# Patient Record
Sex: Female | Born: 1950
Health system: Southern US, Community
[De-identification: ages and names within clinical notes are randomized; demographics above are authoritative.]

## PROBLEM LIST (undated history)

## (undated) DIAGNOSIS — N189 Chronic kidney disease, unspecified: Secondary | ICD-10-CM

## (undated) DIAGNOSIS — C189 Malignant neoplasm of colon, unspecified: Secondary | ICD-10-CM

## (undated) HISTORY — DX: Malignant neoplasm of colon, unspecified: C18.9

## (undated) HISTORY — PX: RHINOPLASTY: SUR1284

## (undated) HISTORY — PX: APPENDECTOMY: SHX54

## (undated) HISTORY — DX: Chronic kidney disease, unspecified: N18.9

## (undated) HISTORY — PX: TUBAL LIGATION: SHX77

## (undated) HISTORY — PX: ABDOMINAL SURGERY: SHX537

## (undated) HISTORY — PX: HERNIA REPAIR: SHX51

## (undated) HISTORY — PX: CHOLECYSTECTOMY: SHX55

---

## 1995-12-14 HISTORY — PX: BREAST SURGERY: SHX581

## 2001-01-17 ENCOUNTER — Inpatient Hospital Stay (HOSPITAL_COMMUNITY): Admission: EM | Admit: 2001-01-17 | Discharge: 2001-01-20 | Payer: Self-pay | Admitting: Emergency Medicine

## 2001-01-17 ENCOUNTER — Encounter: Payer: Self-pay | Admitting: Emergency Medicine

## 2001-01-19 ENCOUNTER — Encounter: Payer: Self-pay | Admitting: Internal Medicine

## 2004-10-16 HISTORY — PX: GASTRIC BYPASS: SHX52

## 2005-11-20 ENCOUNTER — Emergency Department (HOSPITAL_COMMUNITY): Admission: EM | Admit: 2005-11-20 | Discharge: 2005-11-20 | Payer: Self-pay | Admitting: Emergency Medicine

## 2007-12-25 ENCOUNTER — Emergency Department (HOSPITAL_COMMUNITY): Admission: EM | Admit: 2007-12-25 | Discharge: 2007-12-25 | Payer: Self-pay | Admitting: Emergency Medicine

## 2011-03-30 NOTE — H&P (Signed)
[facility]  Patient:    Catherine Fowler, Catherine Fowler                       MRN: [id number] Adm. Date:  [id number] Attending:  Rodrigo Ran A                         History and Physical  PRIMARY CARE PHYSICIAN:  Unassigned.  CHIEF COMPLAINT:  Shortness of breath, cough.  HISTORY OF PRESENT ILLNESS:  [age]-year-old female with no real past medical history except for obesity and previous bypass surgery for obesity. She was doing well until 10 days ago when she developed a cough with some shortness of breath with pain with coughing in her anterior chest.  She presented to the emergency room today with worsening cough and two episodes of posttussive emesis.  She was found to have hypoxia and a right middle lobe and right lower lobe infiltrate as well as some elevated WBC count with a left shift; she is to be admitted for further care.  PAST MEDICAL HISTORY: 1. Bypass procedure for obesity along with cholecystectomy, appendectomy and    bilateral tubal ligation at the same time. 2. G2, P1 parity status with normal vaginal delivery. 3. No other admissions. 4. Normal vaccinations in childhood. 5. No TB exposures. 6. No STD history.  ALLERGIES:  No known allergies.  MEDICATIONS IN THE EMERGENCY ROOM:  Zithromax 500 mg, Tussionex and oxygen as well as normal saline bolus 500 cc.  SOCIAL HISTORY:  She lives in [place].  She is a widow for 17 years.  She has a daughter in [place] named [place].  She denies any drug use, alcohol use or any tobacco use at any time.  Mother is alive in her 41s and healthy. Father died at age 22 of an MI.  REVIEW OF SYSTEMS:  Patient has had also some nasal congestion.  She has had some wheeze.  She has had no definite fevers or chills, no edema.  She has had some nasal congestion again and no blood noted from above or below.  PHYSICAL EXAMINATION:  VITAL SIGNS:  Blood pressure 103/65, pulse 118, respiratory rate  18, temperature 98.2.  GENERAL:  She is sitting in bed, coughing a lot, which is dry.  She is in no acute distress.  She has an interesting affect and smiles with most answers to questions.  HEENT/NECK:  She does have colored contacts on.  There is no lymphadenopathy. Oropharynx is clear.  CHEST:  Slightly decreased breath sounds bilaterally and at the bases but no real crackles or wheeze.  HEART:  Tachycardic with no murmur, rub or gallop.  ABDOMEN:  Soft, nontender, nondistended.  EXTREMITIES:  No edema.  NEUROLOGIC:  She is alert and oriented x 4.  LABORATORY AND X-RAY FINDINGS:  WBC count is 15.4 with 95% segs, 13.8 hemoglobin, 270,000 platelets.  PCO2 36, PO2 83, pH 7.43 with 2 L of oxygen at 96% saturation and bicarb of 23.  CMET is pending.  Blood cultures have been sent.  Chest x-ray shows possible right middle lobe and right lower lobe fluffy infiltrate.  There are no air bronchograms noted and no effusions noted.  ASSESSMENT AND PLAN:  [age]-year-old female with community-acquired pneumonia with mild hypoxia and severe cough.  Will admit, give intravenous fluids with normal saline at 100 cc/hr, treat with intravenous Rocephin and azithromycin.  Will follow up blood cultures. Will treat with antitussives including  guaifenesin, Tessalon Perles and Robitussin AC with codeine; will also given albuterol nebulizers q.6h.  Will place on deep venous thrombosis prophylaxis with subcu heparin, p.r.n. Tylenol and Restoril.  Patient is a full-code resuscitation status. DD:  01/17/01 TD:  01/20/01 Job: [id number]

## 2011-03-30 NOTE — Discharge Summary (Signed)
[facility]  Patient:    Catherine Fowler, Catherine Fowler                       MRN: [id number] Adm. Date:  [id number] Disc. Date: [id number] Attending:  Rodrigo Ran A                           Discharge Summary  DISCHARGE DIAGNOSES: 1. Right middle and right lower lobe community acquired pneumonia. 2. Hypokalemia, resolved. 3. Obesity with previous surgical bypass procedure for this. 4. Past history of cholecystectomy, appendectomy, and bilateral tubal    ligation. 5. G2, P1 parity status.  PROCEDURES:  None.  DISCHARGE MEDICATIONS: 1. Azithromycin 500 mg daily to complete five total days. 2. Augmentin 875 mg twice a day to complete 14 days of total antibiotics. 3. Humibid LA 600 mg b.i.d. p.r.n. cough. 4. Albuterol MDI two puffs q. 4h as needed. 5. Tessalon Perles 200 mg b.i.d. p.r.n. cough. 6. Robitussin HC 5-10 cc q. 4-6h p.r.n. cough. 7. P.R.N. Tylenol 500 mg one or two every 8 hours.  HISTORY OF PRESENT ILLNESS:  Ms. [name] is a pleasant 60 year old female with no real past history who presented to the emergency department with a 10 day history of cough with some shortness of breath and pain with coughing in her anterior chest. In the emergency department, she was found to have a right middle and right lower lobe with mild hypoxia and significantly elevated white blood count with a left shift. She was admitted for further management.  HOSPITAL COURSE:  Ms. [name] was admitted to a regular floor bed. She was placed on oxygen and this was gradually weaned off. She was treated with albuterol nebulizers. Also she was given IV Rocephin and azithromycin. Ms. [name] white count did improve somewhat as did her fever curve. Subjectively after two to three days of hospitalization, she felt significantly improved. Therefore on January 20, 2001 she was deemed stable for discharge home. DISCHARGE INSTRUCTIONS:  She is to be up as tolerated. She is to use incentive spirometry  each hour while awake and this is provided to her. She is to follow a regular diet. She is to call if she has any problems specifically any persistent high fevers or recurrence of shortness of breath or other difficulties.  DISCHARGE LABORATORY DATA:  White blood count was 14.8, hemoglobin 11.6, platelet count 317,000. Repeat chest x-ray on January 19, 2001 showed no real change in the right middle lobe and right lower lobe infiltrate.  FOLLOW-UP:  Ms. [name] is to follow-up in two to three weeks with Dr. Waynard Edwards. She will need a follow-up chest x-ray and blood counts to be sure she has not developed more significant anemia. DD:  01/21/01 TD:  01/21/01 Job: [id number]

## 2011-07-03 ENCOUNTER — Other Ambulatory Visit: Payer: Self-pay | Admitting: Otolaryngology

## 2011-07-03 DIAGNOSIS — H9120 Sudden idiopathic hearing loss, unspecified ear: Secondary | ICD-10-CM

## 2011-07-04 ENCOUNTER — Ambulatory Visit
Admission: RE | Admit: 2011-07-04 | Discharge: 2011-07-04 | Disposition: A | Discharge status: Home / Self Care | Payer: BC Managed Care – PPO | Source: Ambulatory Visit | Attending: Otolaryngology | Admitting: Otolaryngology

## 2011-07-04 DIAGNOSIS — H9120 Sudden idiopathic hearing loss, unspecified ear: Secondary | ICD-10-CM

## 2011-07-18 ENCOUNTER — Other Ambulatory Visit: Payer: Self-pay | Admitting: Otolaryngology

## 2011-07-18 DIAGNOSIS — H9120 Sudden idiopathic hearing loss, unspecified ear: Secondary | ICD-10-CM

## 2011-07-18 DIAGNOSIS — H9319 Tinnitus, unspecified ear: Secondary | ICD-10-CM

## 2011-07-20 ENCOUNTER — Ambulatory Visit
Admission: RE | Admit: 2011-07-20 | Discharge: 2011-07-20 | Disposition: A | Discharge status: Home / Self Care | Payer: BC Managed Care – PPO | Source: Ambulatory Visit | Attending: Otolaryngology | Admitting: Otolaryngology

## 2011-07-20 ENCOUNTER — Other Ambulatory Visit: Payer: BC Managed Care – PPO

## 2011-07-20 DIAGNOSIS — H9319 Tinnitus, unspecified ear: Secondary | ICD-10-CM

## 2011-07-20 DIAGNOSIS — H9120 Sudden idiopathic hearing loss, unspecified ear: Secondary | ICD-10-CM

## 2011-07-20 MED ORDER — IOHEXOL 300 MG/ML  SOLN
75.0000 mL | Freq: Once | INTRAMUSCULAR | Status: AC | PRN
  Administered 2011-07-20: 75 mL via INTRAVENOUS

## 2011-09-23 ENCOUNTER — Emergency Department (HOSPITAL_COMMUNITY): Payer: BC Managed Care – PPO

## 2011-09-23 ENCOUNTER — Inpatient Hospital Stay (HOSPITAL_COMMUNITY)
Admission: EM | Admit: 2011-09-23 | Discharge: 2011-09-28 | DRG: 552 | Disposition: A | Discharge status: Home / Self Care | Payer: BC Managed Care – PPO | Attending: Surgery | Admitting: Surgery

## 2011-09-23 ENCOUNTER — Encounter (HOSPITAL_COMMUNITY): Payer: Self-pay | Admitting: *Deleted

## 2011-09-23 DIAGNOSIS — K651 Peritoneal abscess: Principal | ICD-10-CM | POA: Diagnosis present

## 2011-09-23 DIAGNOSIS — N1330 Unspecified hydronephrosis: Secondary | ICD-10-CM

## 2011-09-23 DIAGNOSIS — R109 Unspecified abdominal pain: Secondary | ICD-10-CM

## 2011-09-23 DIAGNOSIS — N189 Chronic kidney disease, unspecified: Secondary | ICD-10-CM | POA: Diagnosis present

## 2011-09-23 DIAGNOSIS — R51 Headache: Secondary | ICD-10-CM | POA: Diagnosis present

## 2011-09-23 DIAGNOSIS — IMO0001 Reserved for inherently not codable concepts without codable children: Secondary | ICD-10-CM | POA: Diagnosis present

## 2011-09-23 DIAGNOSIS — K5732 Diverticulitis of large intestine without perforation or abscess without bleeding: Secondary | ICD-10-CM | POA: Diagnosis present

## 2011-09-23 DIAGNOSIS — N289 Disorder of kidney and ureter, unspecified: Secondary | ICD-10-CM

## 2011-09-23 DIAGNOSIS — N201 Calculus of ureter: Secondary | ICD-10-CM | POA: Diagnosis present

## 2011-09-23 DIAGNOSIS — N179 Acute kidney failure, unspecified: Secondary | ICD-10-CM | POA: Diagnosis present

## 2011-09-23 DIAGNOSIS — E1165 Type 2 diabetes mellitus with hyperglycemia: Secondary | ICD-10-CM | POA: Diagnosis present

## 2011-09-23 DIAGNOSIS — I129 Hypertensive chronic kidney disease with stage 1 through stage 4 chronic kidney disease, or unspecified chronic kidney disease: Secondary | ICD-10-CM | POA: Diagnosis present

## 2011-09-23 LAB — URINALYSIS, ROUTINE W REFLEX MICROSCOPIC
Bilirubin Urine: NEGATIVE
Glucose, UA: NEGATIVE mg/dL
Protein, ur: NEGATIVE mg/dL
pH: 5.5 (ref 5.0–8.0)

## 2011-09-23 LAB — CBC
HCT: 35 % — ABNORMAL LOW (ref 36.0–46.0)
MCV: 78.8 fL (ref 78.0–100.0)
Platelets: 499 10*3/uL — ABNORMAL HIGH (ref 150–400)
RBC: 4.44 MIL/uL (ref 3.87–5.11)
WBC: 12.3 10*3/uL — ABNORMAL HIGH (ref 4.0–10.5)

## 2011-09-23 LAB — GLUCOSE, CAPILLARY
Glucose-Capillary: 137 mg/dL — ABNORMAL HIGH (ref 70–99)
Glucose-Capillary: 172 mg/dL — ABNORMAL HIGH (ref 70–99)
Glucose-Capillary: 262 mg/dL — ABNORMAL HIGH (ref 70–99)

## 2011-09-23 LAB — COMPREHENSIVE METABOLIC PANEL
AST: 13 U/L (ref 0–37)
Alkaline Phosphatase: 116 U/L (ref 39–117)
BUN: 17 mg/dL (ref 6–23)
CO2: 26 mEq/L (ref 19–32)
Chloride: 101 mEq/L (ref 96–112)
Creatinine, Ser: 1.12 mg/dL — ABNORMAL HIGH (ref 0.50–1.10)
GFR calc non Af Amer: 52 mL/min — ABNORMAL LOW (ref >90)
Potassium: 4.1 mEq/L (ref 3.5–5.1)
Total Bilirubin: 0.1 mg/dL — ABNORMAL LOW (ref 0.3–1.2)

## 2011-09-23 LAB — PROTIME-INR
INR: 1.04 (ref 0.00–1.49)
Prothrombin Time: 13.8 seconds (ref 11.6–15.2)

## 2011-09-23 LAB — APTT: aPTT: 30 seconds (ref 24–37)

## 2011-09-23 LAB — URINE MICROSCOPIC-ADD ON

## 2011-09-23 MED ORDER — MORPHINE SULFATE 2 MG/ML IJ SOLN
2.0000 mg | INTRAMUSCULAR | Status: DC | PRN
  Administered 2011-09-23: 2 mg via INTRAVENOUS
  Filled 2011-09-23: qty 1

## 2011-09-23 MED ORDER — INSULIN ASPART 100 UNIT/ML ~~LOC~~ SOLN
0.0000 [IU] | SUBCUTANEOUS | Status: AC
  Administered 2011-09-23: 10 [IU] via SUBCUTANEOUS
  Filled 2011-09-23: qty 3

## 2011-09-23 MED ORDER — SODIUM CHLORIDE 0.9 % IV BOLUS (SEPSIS)
1000.0000 mL | Freq: Once | INTRAVENOUS | Status: AC
  Administered 2011-09-23: 1000 mL via INTRAVENOUS

## 2011-09-23 MED ORDER — IOHEXOL 300 MG/ML  SOLN
100.0000 mL | Freq: Once | INTRAMUSCULAR | Status: AC | PRN
  Administered 2011-09-23: 100 mL via INTRAVENOUS

## 2011-09-23 MED ORDER — METRONIDAZOLE IN NACL 5-0.79 MG/ML-% IV SOLN
500.0000 mg | Freq: Three times a day (TID) | INTRAVENOUS | Status: DC
  Administered 2011-09-23 – 2011-09-26 (×9): 500 mg via INTRAVENOUS
  Filled 2011-09-23 (×13): qty 100

## 2011-09-23 MED ORDER — SODIUM CHLORIDE 0.9 % IV SOLN
INTRAVENOUS | Status: DC
  Administered 2011-09-23 – 2011-09-25 (×3): via INTRAVENOUS

## 2011-09-23 MED ORDER — PIPERACILLIN-TAZOBACTAM 3.375 G IVPB
3.3750 g | Freq: Once | INTRAVENOUS | Status: DC
  Filled 2011-09-23: qty 50

## 2011-09-23 MED ORDER — HYDROCODONE-ACETAMINOPHEN 5-325 MG PO TABS
1.0000 | ORAL_TABLET | ORAL | Status: DC | PRN
  Administered 2011-09-23: 2 via ORAL
  Filled 2011-09-23: qty 2

## 2011-09-23 MED ORDER — CIPROFLOXACIN IN D5W 400 MG/200ML IV SOLN
400.0000 mg | Freq: Two times a day (BID) | INTRAVENOUS | Status: DC
  Administered 2011-09-23 – 2011-09-26 (×7): 400 mg via INTRAVENOUS
  Filled 2011-09-23 (×8): qty 200

## 2011-09-23 MED ORDER — INSULIN ASPART 100 UNIT/ML ~~LOC~~ SOLN
0.0000 [IU] | SUBCUTANEOUS | Status: DC
  Administered 2011-09-23 – 2011-09-24 (×2): 4 [IU] via SUBCUTANEOUS
  Administered 2011-09-24: 2 [IU] via SUBCUTANEOUS
  Administered 2011-09-24: 8 [IU] via SUBCUTANEOUS
  Administered 2011-09-24: 2 [IU] via SUBCUTANEOUS

## 2011-09-23 MED ORDER — ONDANSETRON HCL 4 MG/2ML IJ SOLN: 4.0000 mg | Freq: Four times a day (QID) | INTRAMUSCULAR | Status: DC | PRN

## 2011-09-23 MED ORDER — LISINOPRIL 5 MG PO TABS
5.0000 mg | ORAL_TABLET | Freq: Every day | ORAL | Status: DC
  Administered 2011-09-23: 5 mg via ORAL
  Filled 2011-09-23 (×3): qty 1

## 2011-09-23 MED ORDER — ENOXAPARIN SODIUM 40 MG/0.4ML ~~LOC~~ SOLN
40.0000 mg | SUBCUTANEOUS | Status: DC
  Administered 2011-09-23 – 2011-09-27 (×5): 40 mg via SUBCUTANEOUS
  Filled 2011-09-23 (×6): qty 0.4

## 2011-09-23 NOTE — ED Notes (Signed)
Pt. Aware of the need for a urine specimen.  Stated she may have to go in a little while.  Call light within reach, and told to call when she thinks she can go to the restroom.  Will recheck in 15 min.

## 2011-09-23 NOTE — ED Provider Notes (Signed)
History     CSN: [id number] Arrival date & time: 09/23/2011  6:59 AM   First MD Initiated Contact with Patient 09/23/11 [id number]      Chief Complaint  Patient presents with  . Abdominal Pain    (Consider location/radiation/quality/duration/timing/severity/associated sxs/prior treatment) Patient is a 60 y.o. female presenting with abdominal pain. The history is provided by the patient.  Abdominal Pain The primary symptoms of the illness include abdominal pain. The primary symptoms of the illness do not include fever, shortness of breath or dysuria.  Symptoms associated with the illness do not include back pain.  pt c/o right abd pain for past 10 days, constant, dull, non radiating. No nv. Having normal bms. No hx same pain. Past surgical hx includes remote cholecystectomy, appendx, wt reduction surgery, and tl. No gu c/o. No vaginal discharge or bleeding. No back or flank pain. No exacerbating or allev factors. No fever/chills.   Past Medical History  Diagnosis Date  . Diabetes mellitus     Past Surgical History  Procedure Date  . Abdominal surgery     History reviewed. No pertinent family history.  History  Substance Use Topics  . Smoking status: Never Smoker   . Smokeless tobacco: Not on file  . Alcohol Use: Yes    OB History    Grav Para Term Preterm Abortions TAB SAB Ect Mult Living                  Review of Systems  Constitutional: Negative for fever.  HENT: Negative for neck pain.   Eyes: Negative for redness.  Respiratory: Negative for shortness of breath.   Cardiovascular: Negative for chest pain.  Gastrointestinal: Positive for abdominal pain.  Genitourinary: Negative for dysuria.  Musculoskeletal: Negative for back pain.  Skin: Negative for rash.  Neurological: Negative for headaches.  Hematological: Does not bruise/bleed easily.  Psychiatric/Behavioral: Negative for confusion.    Allergies  Review of patient's allergies indicates no known  allergies.  Home Medications   Current Outpatient Rx  Name Route Sig Dispense Refill  . LISINOPRIL 5 MG PO TABS Oral Take 5 mg by mouth daily.      Marland Kitchen METFORMIN HCL 500 MG PO TABS Oral Take 500 mg by mouth 2 (two) times daily with a meal.        There were no vitals taken for this visit.  Physical Exam  Nursing note and vitals reviewed. Constitutional: She appears well-developed and well-nourished. No distress.  Eyes: Conjunctivae are normal. No scleral icterus.  Neck: Neck supple. No tracheal deviation present.  Cardiovascular: Normal rate, regular rhythm, normal heart sounds and intact distal pulses.  Exam reveals no gallop and no friction rub.   No murmur heard. Pulmonary/Chest: Effort normal and breath sounds normal. No respiratory distress. She has no rales.  Abdominal: Soft. Normal appearance and bowel sounds are normal. She exhibits no distension and no mass. There is tenderness. There is no rebound and no guarding.       Right abd tenderness  Genitourinary:       No cva tenderness  Musculoskeletal: She exhibits no edema.  Neurological: She is alert.  Skin: Skin is warm and dry. No rash noted.  Psychiatric: She has a normal mood and affect.    ED Course  Procedures (including critical care time)  Labs Reviewed  GLUCOSE, CAPILLARY - Abnormal; Notable for the following:    Glucose-Capillary 179 (*)    All other components within normal limits  CBC  COMPREHENSIVE  METABOLIC PANEL  LIPASE, BLOOD  URINALYSIS, ROUTINE W REFLEX MICROSCOPIC   No results found. Results for orders placed during the hospital encounter of 09/23/11  GLUCOSE, CAPILLARY      Component Value Range   Glucose-Capillary 179 (*) 70 - 99 (mg/dL)  CBC      Component Value Range   WBC 12.3 (*) 4.0 - 10.5 (K/uL)   RBC 4.44  3.87 - 5.11 (MIL/uL)   Hemoglobin 10.8 (*) 12.0 - 15.0 (g/dL)   HCT 16.1 (*) 09.6 - 46.0 (%)   MCV 78.8  78.0 - 100.0 (fL)   MCH 24.3 (*) 26.0 - 34.0 (pg)   MCHC 30.9  30.0 -  36.0 (g/dL)   RDW 04.5  40.9 - 81.1 (%)   Platelets 499 (*) 150 - 400 (K/uL)  COMPREHENSIVE METABOLIC PANEL      Component Value Range   Sodium 137  135 - 145 (mEq/L)   Potassium 4.1  3.5 - 5.1 (mEq/L)   Chloride 101  96 - 112 (mEq/L)   CO2 26  19 - 32 (mEq/L)   Glucose, Bld 173 (*) 70 - 99 (mg/dL)   BUN 17  6 - 23 (mg/dL)   Creatinine, Ser 9.14 (*) 0.50 - 1.10 (mg/dL)   Calcium 9.3  8.4 - 78.2 (mg/dL)   Total Protein 7.0  6.0 - 8.3 (g/dL)   Albumin 3.0 (*) 3.5 - 5.2 (g/dL)   AST 13  0 - 37 (U/L)   ALT 9  0 - 35 (U/L)   Alkaline Phosphatase 116  39 - 117 (U/L)   Total Bilirubin 0.1 (*) 0.3 - 1.2 (mg/dL)   GFR calc non Af Amer 52 (*) >90 (mL/min)   GFR calc Af Amer 60 (*) >90 (mL/min)  LIPASE, BLOOD      Component Value Range   Lipase 34  11 - 59 (U/L)  URINALYSIS, ROUTINE W REFLEX MICROSCOPIC      Component Value Range   Color, Urine YELLOW  YELLOW    Appearance CLOUDY (*) CLEAR    Specific Gravity, Urine 1.015  1.005 - 1.030    pH 5.5  5.0 - 8.0    Glucose, UA NEGATIVE  NEGATIVE (mg/dL)   Hgb urine dipstick NEGATIVE  NEGATIVE    Bilirubin Urine NEGATIVE  NEGATIVE    Ketones, ur TRACE (*) NEGATIVE (mg/dL)   Protein, ur NEGATIVE  NEGATIVE (mg/dL)   Urobilinogen, UA 0.2  0.0 - 1.0 (mg/dL)   Nitrite NEGATIVE  NEGATIVE    Leukocytes, UA SMALL (*) NEGATIVE   URINE MICROSCOPIC-ADD ON      Component Value Range   Squamous Epithelial / LPF RARE  RARE    WBC, UA 21-50  <3 (WBC/hpf)   Bacteria, UA MANY (*) RARE    No results found.    No diagnosis found.    MDM  Iv ns bolus. Labs. Ct.   morpine 4 mg iv for pain. ua pos, ucx sent. Ct result noted. General surgery called.      Suzi Roots, MD 09/27/11 [phone]

## 2011-09-23 NOTE — ED Notes (Signed)
Pt. Provided with chicken noodle soup and diet gingerale.

## 2011-09-23 NOTE — ED Notes (Signed)
Pt. Provided with Sprite and rice krispie treat.  No other needs at this time.

## 2011-09-23 NOTE — ED Notes (Signed)
Informed Dr. Biagio Quint concerning replacing admission orders in sign/held vs. Signing orders. Informed MD that he would have d/c previous orders and replace, then sign/held. MD stated that he would work on this matter.

## 2011-09-23 NOTE — ED Notes (Signed)
Gave report to [place], RN in [place] will transport pt via stretcher.

## 2011-09-23 NOTE — H&P (Signed)
Catherine Fowler is an 60 y.o. female.   Chief Complaint: abdominal pain HPI:  This patient presents to the emergency room for evaluation of two-week history of abdominal pain. She states that approximately 2 weeks ago she began having abdominal distention and the next day had an episode of severe upper abdominal pain which she describes as 10 out of 10 pain. At the time she did not seek evaluation and the pain had gradually migrated to the right side. She was later seen at an urgent care on November 2 and she states her laboratory studies and x-rays were performed. She was diagnosed with "constipation and diabetes". She was referred for to an endocrinologist for further evaluation of her diabetes and was started on oral medications for treatment of this problem. 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 However, she states that she normally has daily bowel movements and denies any melena or blood in the stools and she denies any change of the MiraLAX. She has remained distended but has had no other symptoms other than this constant "dull" pain in her right mid abdomen. She denies any fevers chills nausea or vomiting or weight loss. She has been tolerating regular diet as well. She called her surgeon who performed a revision of a jejunoileal bypass and conversion to mini gastric bypass in 2005 at [facility] and he recommended that she come to the emergency room for evaluation and CT scan. CT scan was performed which demonstrated a intra-abdominal abscess approximately 6 cm with surrounding inflammatory change in the mesentery and air bubbles but no evidence of free air. She has some reactive small bowel adjacent to the inflammatory changes as well as some transverse colon with some diverticuli. The origin of her abscess is unclear on CT scan.   Past Medical History  Diagnosis Date  . Diabetes mellitus     Past Surgical History  Procedure Date  .  Abdominal surgery   jejunoileal bypass, tubal ligation, cholecystectomy, and appendectomy performed in 1978 Mini gastric bypass and revision of jejunoileal bypass in 2005 Breast reduction in 1997  History reviewed. No pertinent family history.  All other review of systems negative or noncontributory except as stated in the HPI  Social History:  reports that she has never smoked. She does not have any smokeless tobacco history on file. She reports that she drinks alcohol. She reports that she does not use illicit drugs.  Allergies: No Known Allergies  Medications Prior to Admission  Medication Dose Route Frequency Provider Last Rate Last Dose  . 0.9 %  sodium chloride infusion   Intravenous Continuous Rulon Abide, DO      . ciprofloxacin (CIPRO) IVPB 400 mg  400 mg Intravenous Q12H Rulon Abide, DO      . enoxaparin (LOVENOX) injection 40 mg  40 mg Subcutaneous Q24H Rulon Abide, DO      . HYDROcodone-acetaminophen (NORCO) 5-325 MG per tablet 1-2 tablet  1-2 tablet Oral Q4H PRN Rulon Abide, DO      . insulin aspart (novoLOG) injection 0-24 Units  0-24 Units Subcutaneous Q2H Rulon Abide, DO       Followed by  . insulin aspart (novoLOG) injection 0-24 Units  0-24 Units Subcutaneous Q4H Rulon Abide, DO      . iohexol (OMNIPAQUE) 300 MG/ML injection 100 mL  100 mL Intravenous Once PRN Medication Radiologist   100 mL at 09/23/11 1037  .  lisinopril (PRINIVIL,ZESTRIL) tablet 5 mg  5 mg Oral Daily Rulon Abide, DO      . metroNIDAZOLE (FLAGYL) IVPB 500 mg  500 mg Intravenous Q8H Rulon Abide, DO      . morphine 2 MG/ML injection 2 mg  2 mg Intravenous Q1H PRN Rulon Abide, DO      . ondansetron [facility]) injection 4 mg  4 mg Intravenous Q6H PRN Rulon Abide, DO      . sodium chloride 0.9 % bolus 1,000 mL  1,000 mL Intravenous Once Suzi Roots, MD   1,000 mL at 09/23/11 0855  . DISCONTD: piperacillin-tazobactam (ZOSYN) IVPB 3.375 g   3.375 g Intravenous Once Suzi Roots, MD       No current outpatient prescriptions on file as of 09/23/2011.    Results for orders placed during the hospital encounter of 09/23/11 (from the past 48 hour(s))  GLUCOSE, CAPILLARY     Status: Abnormal   Collection Time   09/23/11  7:13 AM      Component Value Range Comment   Glucose-Capillary 179 (*) 70 - 99 (mg/dL)   CBC     Status: Abnormal   Collection Time   09/23/11  8:58 AM      Component Value Range Comment   WBC 12.3 (*) 4.0 - 10.5 (K/uL)    RBC 4.44  3.87 - 5.11 (MIL/uL)    Hemoglobin 10.8 (*) 12.0 - 15.0 (g/dL)    HCT 16.1 (*) 09.6 - 46.0 (%)    MCV 78.8  78.0 - 100.0 (fL)    MCH 24.3 (*) 26.0 - 34.0 (pg)    MCHC 30.9  30.0 - 36.0 (g/dL)    RDW 04.5  40.9 - 81.1 (%)    Platelets 499 (*) 150 - 400 (K/uL)   COMPREHENSIVE METABOLIC PANEL     Status: Abnormal   Collection Time   09/23/11  8:58 AM      Component Value Range Comment   Sodium 137  135 - 145 (mEq/L)    Potassium 4.1  3.5 - 5.1 (mEq/L)    Chloride 101  96 - 112 (mEq/L)    CO2 26  19 - 32 (mEq/L)    Glucose, Bld 173 (*) 70 - 99 (mg/dL)    BUN 17  6 - 23 (mg/dL)    Creatinine, Ser 9.14 (*) 0.50 - 1.10 (mg/dL)    Calcium 9.3  8.4 - 10.5 (mg/dL)    Total Protein 7.0  6.0 - 8.3 (g/dL)    Albumin 3.0 (*) 3.5 - 5.2 (g/dL)    AST 13  0 - 37 (U/L)    ALT 9  0 - 35 (U/L)    Alkaline Phosphatase 116  39 - 117 (U/L)    Total Bilirubin 0.1 (*) 0.3 - 1.2 (mg/dL)    GFR calc non Af Amer 52 (*) >90 (mL/min)    GFR calc Af Amer 60 (*) >90 (mL/min)   LIPASE, BLOOD     Status: Normal   Collection Time   09/23/11  8:58 AM      Component Value Range Comment   Lipase 34  11 - 59 (U/L)   URINALYSIS, ROUTINE W REFLEX MICROSCOPIC     Status: Abnormal   Collection Time   09/23/11  9:50 AM      Component Value Range Comment   Color, Urine YELLOW  YELLOW     Appearance CLOUDY (*) CLEAR     Specific Gravity, Urine  1.015  1.005 - 1.030     pH 5.5  5.0 - 8.0     Glucose, UA  NEGATIVE  NEGATIVE (mg/dL)    Hgb urine dipstick NEGATIVE  NEGATIVE     Bilirubin Urine NEGATIVE  NEGATIVE     Ketones, ur TRACE (*) NEGATIVE (mg/dL)    Protein, ur NEGATIVE  NEGATIVE (mg/dL)    Urobilinogen, UA 0.2  0.0 - 1.0 (mg/dL)    Nitrite NEGATIVE  NEGATIVE     Leukocytes, UA SMALL (*) NEGATIVE    URINE MICROSCOPIC-ADD ON     Status: Abnormal   Collection Time   09/23/11  9:50 AM      Component Value Range Comment   Squamous Epithelial / LPF RARE  RARE     WBC, UA 21-50  <3 (WBC/hpf)    Bacteria, UA MANY (*) RARE     Ct Abdomen Pelvis W Contrast  09/23/2011  *RADIOLOGY REPORT*  Clinical Data: Abdominal pain for 10 days  CT ABDOMEN AND PELVIS WITH CONTRAST  Technique:  Multidetector CT imaging of the abdomen and pelvis was performed following the standard protocol during bolus administration of intravenous contrast.  Contrast: OMNIPAQUE IOHEXOL 300 MG/ML IV SOLN  Comparison: None.  Findings:  Normal hepatic contour.  No discrete hepatic lesions.  No ascites. The gallbladder surgically absent.  No ascites.  The left kidney is atrophic with likely chronic left-sided severe hydronephrosis secondary to an approximately 1.4 x 1.1 cm stone within the left UVJ. This finding is associated with expected delayed excretion on the left kidney.  There is normal excretion from the right kidney.  Punctate hypoattenuating lesion within the inferior aspect of the right kidney is too small to fully characterize and may represent a renal cyst. Bilateral adrenal glands are normal.  Normal pancreas.  Punctate hypoattenuating lesion within the spleen (image 20) is too small to accurate characterize.  Incidental note is made of a small splenule.  Sequela of prior gastric bypass.  Enteric contrast extends to the cecum.  No evidence of enteric obstruction. There is a serpiginous peripheral enhancing fluid collection within the anterior abdominal mesentery with dominant fluid collection measuring approximately  measuring approximately 6.4 x 5.4 cm (image 50, series 2), and contains several small foci of air.  There is associated wall thickening involving the adjacent loops of small bowel (image 55, series 2), however there is no definite extravasation of enteric ingested contrast.  A portion of this fluid collection extends through the midline anterior abdominal wall incisional hernia (image 52).  A minimal amount of fluid tracks within the adjacent ventral mesentery to a small bowel anastomosis within the left lower abdominal quadrant (image 40, series 2).  No definite pneumatosis or portal venous gas.  Normal caliber of the abdominal aorta.  The major branch vessels of the abdominal aorta, including and 80, are patent.  No retroperitoneal, mesenteric, pelvic or inguinal lymphadenopathy. Pelvic organs are normal.  No free fluid within the pelvis.  Limited visualization of the lower thorax demonstrates right basilar atelectasis.  No focal airspace opacity or pleural effusion.  Normal heart size.  No pericardial effusion.  No acute aggressive osseous abnormalities.  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  Complex intraabdominal abscess, the largest component of which measures approximately 6.4 cm in diameter.  The etiology of this abscess is not definitively identified on this examination.  There is scattered diverticuli  within the transverse colon, however the colon is remote from the inflammatory process. No definite evidence of anastomotic breakdown from prior gastric bypass surgery.  No evidence of enteric obstruction.  2.  Approximately 1.4 cm stone within the left UVJ resulting in likely chronic left-sided hydronephrosis with associated atrophy of the left kidney.  Above findings discussed with Dr. Denton Lank (ED) at 11:11.  Original Report Authenticated By: Waynard Reeds, M.D.    @ROS @  Blood pressure 133/59, pulse 71,  temperature 98.5 F (36.9 C), temperature source Oral, resp. rate 14, SpO2 100.00%. General appearance: alert, cooperative and no distress Head: Normocephalic, without obvious abnormality, atraumatic Neck: no adenopathy, supple, symmetrical, trachea midline and thyroid not enlarged, symmetric, no tenderness/mass/nodules Resp: clear to auscultation bilaterally Cardio: regular rate and rhythm, S1, S2 normal, no murmur, click, rub or gallop GI: soft, obese, minimal right sided abdominal tenderness, no peritoneal signs, no obvious hernia on exam though evidence of small ventral hernia on CT Extremities: extremities normal, atraumatic, no cyanosis or edema Skin: Skin color, texture, turgor normal. No rashes or lesions Neurologic: Grossly normal  Assessment/Plan Intra-abdominal abscess of uncertain etiology  I think that this is most likely a diverticular abscess. Her symptoms have been present for 2 weeks and this is likely an episode of acute diverticulitis which has formed an abscess. However, given her extensive abdominal surgical history cannot be entirely certain. The inflammatory collection does not appear to be due to her prior weight loss surgery and does not appear to be an acute leak. There is no evidence of contrast extravasation or free air. She appears clinically very stable and very benign on exam. She does have an elevated white blood cell count but other than some minimal abdominal tenderness has no significant symptoms. I have reviewed her images and I have reviewed them with the radiologist as well and this is thought to be due to diverticular abscess I have recommended that she be admitted for IV antibiotics and consider percutaneous drainage the fluid collection tomorrow.  Lodema Pilot DAVID 09/23/2011, 1:37 PM

## 2011-09-23 NOTE — ED Notes (Signed)
Zosyn was already administered at 1300 ans was not able to sign mar.

## 2011-09-23 NOTE — ED Notes (Signed)
Pt in c/o abd pain x10 days, pt states she had abd surgery 5 years ago and called her surgeon yesterday and was told to come to ED, denies n/v

## 2011-09-24 ENCOUNTER — Inpatient Hospital Stay (HOSPITAL_COMMUNITY): Payer: BC Managed Care – PPO

## 2011-09-24 DIAGNOSIS — IMO0002 Reserved for concepts with insufficient information to code with codable children: Secondary | ICD-10-CM | POA: Diagnosis present

## 2011-09-24 DIAGNOSIS — E1165 Type 2 diabetes mellitus with hyperglycemia: Secondary | ICD-10-CM | POA: Diagnosis present

## 2011-09-24 LAB — COMPREHENSIVE METABOLIC PANEL
AST: 12 U/L (ref 0–37)
CO2: 25 mEq/L (ref 19–32)
Calcium: 8.6 mg/dL (ref 8.4–10.5)
Chloride: 103 mEq/L (ref 96–112)
Creatinine, Ser: 1.34 mg/dL — ABNORMAL HIGH (ref 0.50–1.10)
GFR calc Af Amer: 48 mL/min — ABNORMAL LOW (ref >90)
GFR calc non Af Amer: 41 mL/min — ABNORMAL LOW (ref >90)
Glucose, Bld: 172 mg/dL — ABNORMAL HIGH (ref 70–99)
Total Bilirubin: 0.2 mg/dL — ABNORMAL LOW (ref 0.3–1.2)

## 2011-09-24 LAB — URINE CULTURE
Colony Count: 40000
Culture  Setup Time: 201211111359

## 2011-09-24 LAB — LIPID PANEL
HDL: 26 mg/dL — ABNORMAL LOW (ref >39)
LDL Cholesterol: 99 mg/dL (ref 0–99)
Total CHOL/HDL Ratio: 5.8 RATIO
Triglycerides: 128 mg/dL (ref <150)
VLDL: 26 mg/dL (ref 0–40)

## 2011-09-24 LAB — GLUCOSE, CAPILLARY
Glucose-Capillary: 160 mg/dL — ABNORMAL HIGH (ref 70–99)
Glucose-Capillary: 233 mg/dL — ABNORMAL HIGH (ref 70–99)
Glucose-Capillary: 125 mg/dL — ABNORMAL HIGH (ref 70–99)

## 2011-09-24 LAB — CBC
MCH: 23.7 pg — ABNORMAL LOW (ref 26.0–34.0)
MCHC: 30.1 g/dL (ref 30.0–36.0)
Platelets: 434 10*3/uL — ABNORMAL HIGH (ref 150–400)
RBC: 3.96 MIL/uL (ref 3.87–5.11)

## 2011-09-24 MED ORDER — LORAZEPAM 2 MG/ML IJ SOLN: INTRAMUSCULAR | Status: AC | PRN

## 2011-09-24 MED ORDER — LISINOPRIL 10 MG PO TABS
10.0000 mg | ORAL_TABLET | Freq: Every day | ORAL | Status: DC
  Administered 2011-09-25 – 2011-09-27 (×3): 10 mg via ORAL
  Filled 2011-09-24 (×4): qty 1

## 2011-09-24 MED ORDER — MIDAZOLAM HCL 5 MG/5ML IJ SOLN
INTRAMUSCULAR | Status: DC | PRN
  Administered 2011-09-24: 2 mg via INTRAVENOUS

## 2011-09-24 MED ORDER — ACETAMINOPHEN 325 MG PO TABS
650.0000 mg | ORAL_TABLET | ORAL | Status: DC | PRN
  Administered 2011-09-24: 650 mg via ORAL
  Filled 2011-09-24: qty 2

## 2011-09-24 MED ORDER — INSULIN GLARGINE 100 UNIT/ML ~~LOC~~ SOLN
8.0000 [IU] | Freq: Every day | SUBCUTANEOUS | Status: DC
  Administered 2011-09-25 – 2011-09-27 (×3): 8 [IU] via SUBCUTANEOUS
  Filled 2011-09-24: qty 3

## 2011-09-24 MED ORDER — INSULIN ASPART 100 UNIT/ML ~~LOC~~ SOLN
0.0000 [IU] | Freq: Three times a day (TID) | SUBCUTANEOUS | Status: DC
  Administered 2011-09-25: 1 [IU] via SUBCUTANEOUS
  Administered 2011-09-25 (×2): 2 [IU] via SUBCUTANEOUS
  Administered 2011-09-26: 1 [IU] via SUBCUTANEOUS
  Administered 2011-09-26: 2 [IU] via SUBCUTANEOUS
  Administered 2011-09-27 (×2): 1 [IU] via SUBCUTANEOUS
  Administered 2011-09-28: 3 [IU] via SUBCUTANEOUS

## 2011-09-24 MED ORDER — FENTANYL CITRATE 0.05 MG/ML IJ SOLN
INTRAMUSCULAR | Status: AC | PRN
  Administered 2011-09-24: 100 ug via INTRAVENOUS

## 2011-09-24 NOTE — Progress Notes (Signed)
Patient taken to radiology for drainage of abscess. Left unit in bed. Vital signs stable

## 2011-09-24 NOTE — Consult Note (Signed)
Reason for Consult:Management of Newly diagnosed diabetes mellitus and Htn Referring Physician: Dr Scherry Ran I Catherine Fowler is an 60 y.o. female.  HPI: I was asked to see Catherine Fowler, a pleasant lady admitted for diverticular abscess, which was drained by IR today, for management of her newly diagnosed diabetes mellitus and htn. The patient mentions that she was diagnosed of diabetes on November 2nd by her PCP and was then referred to an endocrinologist who placed her on metformin and lisinopril. She had complained of abdominal pains and chills prior to this. She says she had otherwise been in good health and did not have polyuria or polydipsia. She apparently had HbA1C done by her PCP and it was 12 mg/dl. Patient is currently on SSI. Her sugars have been generally less than 200 mg/dl. Patient denies respiratory or genitourinary symptoms.  Past Medical History  Diagnosis Date  . Diabetes mellitus     Past Surgical History  Procedure Date  . Abdominal surgery   . Appendectomy   . Cholecystectomy   . Tubal ligation     Family History  Problem Relation Age of Onset  . Diabetes type II Father   . Diabetes type II Sister   . Diabetes type II Other   . Diabetes type II Maternal Aunt     Social History:  reports that she has never smoked. She does not have any smokeless tobacco history on file. She reports that she drinks alcohol. She reports that she does not use illicit drugs.  Allergies: No Known Allergies  Medications: I have reviewed the patient's current medications.  Results for orders placed during the hospital encounter of 09/23/11 (from the past 48 hour(s))  GLUCOSE, CAPILLARY     Status: Abnormal   Collection Time   09/23/11  7:13 AM      Component Value Range Comment   Glucose-Capillary 179 (*) 70 - 99 (mg/dL)   CBC     Status: Abnormal   Collection Time   09/23/11  8:58 AM      Component Value Range Comment   WBC 12.3 (*) 4.0 - 10.5 (K/uL)    RBC 4.44  3.87 - 5.11 (MIL/uL)     Hemoglobin 10.8 (*) 12.0 - 15.0 (g/dL)    HCT 40.9 (*) 81.1 - 46.0 (%)    MCV 78.8  78.0 - 100.0 (fL)    MCH 24.3 (*) 26.0 - 34.0 (pg)    MCHC 30.9  30.0 - 36.0 (g/dL)    RDW 91.4  78.2 - 95.6 (%)    Platelets 499 (*) 150 - 400 (K/uL)   COMPREHENSIVE METABOLIC PANEL     Status: Abnormal   Collection Time   09/23/11  8:58 AM      Component Value Range Comment   Sodium 137  135 - 145 (mEq/L)    Potassium 4.1  3.5 - 5.1 (mEq/L)    Chloride 101  96 - 112 (mEq/L)    CO2 26  19 - 32 (mEq/L)    Glucose, Bld 173 (*) 70 - 99 (mg/dL)    BUN 17  6 - 23 (mg/dL)    Creatinine, Ser 2.13 (*) 0.50 - 1.10 (mg/dL)    Calcium 9.3  8.4 - 10.5 (mg/dL)    Total Protein 7.0  6.0 - 8.3 (g/dL)    Albumin 3.0 (*) 3.5 - 5.2 (g/dL)    AST 13  0 - 37 (U/L)    ALT 9  0 - 35 (U/L)  Alkaline Phosphatase 116  39 - 117 (U/L)    Total Bilirubin 0.1 (*) 0.3 - 1.2 (mg/dL)    GFR calc non Af Amer 52 (*) >90 (mL/min)    GFR calc Af Amer 60 (*) >90 (mL/min)   LIPASE, BLOOD     Status: Normal   Collection Time   09/23/11  8:58 AM      Component Value Range Comment   Lipase 34  11 - 59 (U/L)   URINALYSIS, ROUTINE W REFLEX MICROSCOPIC     Status: Abnormal   Collection Time   09/23/11  9:50 AM      Component Value Range Comment   Color, Urine YELLOW  YELLOW     Appearance CLOUDY (*) CLEAR     Specific Gravity, Urine 1.015  1.005 - 1.030     pH 5.5  5.0 - 8.0     Glucose, UA NEGATIVE  NEGATIVE (mg/dL)    Hgb urine dipstick NEGATIVE  NEGATIVE     Bilirubin Urine NEGATIVE  NEGATIVE     Ketones, ur TRACE (*) NEGATIVE (mg/dL)    Protein, ur NEGATIVE  NEGATIVE (mg/dL)    Urobilinogen, UA 0.2  0.0 - 1.0 (mg/dL)    Nitrite NEGATIVE  NEGATIVE     Leukocytes, UA SMALL (*) NEGATIVE    URINE MICROSCOPIC-ADD ON     Status: Abnormal   Collection Time   09/23/11  9:50 AM      Component Value Range Comment   Squamous Epithelial / LPF RARE  RARE     WBC, UA 21-50  <3 (WBC/hpf)    Bacteria, UA MANY (*) RARE    URINE  CULTURE     Status: Normal   Collection Time   09/23/11 11:13 AM      Component Value Range Comment   Specimen Description URINE, CLEAN CATCH      Special Requests NONE      Setup Time [id number]      Colony Count 40,000 COLONIES/ML      Culture        Value: Multiple bacterial morphotypes present, none predominant. Suggest appropriate recollection if clinically indicated.   Report Status 09/24/2011 FINAL     PROTIME-INR     Status: Normal   Collection Time   09/23/11  2:05 PM      Component Value Range Comment   Prothrombin Time 13.8  11.6 - 15.2 (seconds)    INR 1.04  0.00 - 1.49    APTT     Status: Normal   Collection Time   09/23/11  2:05 PM      Component Value Range Comment   aPTT 30  24 - 37 (seconds)   GLUCOSE, CAPILLARY     Status: Abnormal   Collection Time   09/23/11  5:41 PM      Component Value Range Comment   Glucose-Capillary 262 (*) 70 - 99 (mg/dL)   GLUCOSE, CAPILLARY     Status: Abnormal   Collection Time   09/23/11  8:46 PM      Component Value Range Comment   Glucose-Capillary 172 (*) 70 - 99 (mg/dL)    Comment 1 Notify RN     GLUCOSE, CAPILLARY     Status: Abnormal   Collection Time   09/23/11 11:45 PM      Component Value Range Comment   Glucose-Capillary 137 (*) 70 - 99 (mg/dL)    Comment 1 Notify RN     GLUCOSE, CAPILLARY  Status: Abnormal   Collection Time   09/24/11  3:59 AM      Component Value Range Comment   Glucose-Capillary 164 (*) 70 - 99 (mg/dL)    Comment 1 Notify RN     COMPREHENSIVE METABOLIC PANEL     Status: Abnormal   Collection Time   09/24/11  5:23 AM      Component Value Range Comment   Sodium 136  135 - 145 (mEq/L)    Potassium 4.1  3.5 - 5.1 (mEq/L)    Chloride 103  96 - 112 (mEq/L)    CO2 25  19 - 32 (mEq/L)    Glucose, Bld 172 (*) 70 - 99 (mg/dL)    BUN 15  6 - 23 (mg/dL)    Creatinine, Ser 1.30 (*) 0.50 - 1.10 (mg/dL)    Calcium 8.6  8.4 - 10.5 (mg/dL)    Total Protein 5.9 (*) 6.0 - 8.3 (g/dL)    Albumin 2.4  (*) 3.5 - 5.2 (g/dL)    AST 12  0 - 37 (U/L)    ALT 8  0 - 35 (U/L)    Alkaline Phosphatase 106  39 - 117 (U/L)    Total Bilirubin 0.2 (*) 0.3 - 1.2 (mg/dL)    GFR calc non Af Amer 41 (*) >90 (mL/min)    GFR calc Af Amer 48 (*) >90 (mL/min)   CBC     Status: Abnormal   Collection Time   09/24/11  5:23 AM      Component Value Range Comment   WBC 11.7 (*) 4.0 - 10.5 (K/uL)    RBC 3.96  3.87 - 5.11 (MIL/uL)    Hemoglobin 9.4 (*) 12.0 - 15.0 (g/dL)    HCT 86.5 (*) 78.4 - 46.0 (%)    MCV 78.8  78.0 - 100.0 (fL)    MCH 23.7 (*) 26.0 - 34.0 (pg)    MCHC 30.1  30.0 - 36.0 (g/dL)    RDW 69.6  29.5 - 28.4 (%)    Platelets 434 (*) 150 - 400 (K/uL)   GLUCOSE, CAPILLARY     Status: Abnormal   Collection Time   09/24/11  7:58 AM      Component Value Range Comment   Glucose-Capillary 160 (*) 70 - 99 (mg/dL)    Comment 1 Notify RN      Comment 2 Documented in Chart     GLUCOSE, CAPILLARY     Status: Abnormal   Collection Time   09/24/11  3:53 PM      Component Value Range Comment   Glucose-Capillary 233 (*) 70 - 99 (mg/dL)    Comment 1 Notify RN       Ct Guided Abscess Drain  09/24/2011  Casimiro Needle T. Shick     09/24/2011 12:13 PM Successful CT guided anterior abdominal abscess drain  placement(61fr) 30cc pus aspirated and sent for GS and CX No comp Stable    Ct Abdomen Pelvis W Contrast  09/23/2011  *RADIOLOGY REPORT*  Clinical Data: Abdominal pain for 10 days  CT ABDOMEN AND PELVIS WITH CONTRAST  Technique:  Multidetector CT imaging of the abdomen and pelvis was performed following the standard protocol during bolus administration of intravenous contrast.  Contrast: OMNIPAQUE IOHEXOL 300 MG/ML IV SOLN  Comparison: None.  Findings:  Normal hepatic contour.  No discrete hepatic lesions.  No ascites. The gallbladder surgically absent.  No ascites.  The left kidney is atrophic with likely chronic left-sided severe hydronephrosis secondary to an approximately  1.4 x 1.1 cm stone within the  left UVJ. This finding is associated with expected delayed excretion on the left kidney.  There is normal excretion from the right kidney.  Punctate hypoattenuating lesion within the inferior aspect of the right kidney is too small to fully characterize and may represent a renal cyst. Bilateral adrenal glands are normal.  Normal pancreas.  Punctate hypoattenuating lesion within the spleen (image 20) is too small to accurate characterize.  Incidental note is made of a small splenule.  Sequela of prior gastric bypass.  Enteric contrast extends to the cecum.  No evidence of enteric obstruction. There is a serpiginous peripheral enhancing fluid collection within the anterior abdominal mesentery with dominant fluid collection measuring approximately measuring approximately 6.4 x 5.4 cm (image 50, series 2), and contains several small foci of air.  There is associated wall thickening involving the adjacent loops of small bowel (image 55, series 2), however there is no definite extravasation of enteric ingested contrast.  A portion of this fluid collection extends through the midline anterior abdominal wall incisional hernia (image 52).  A minimal amount of fluid tracks within the adjacent ventral mesentery to a small bowel anastomosis within the left lower abdominal quadrant (image 40, series 2).  No definite pneumatosis or portal venous gas.  Normal caliber of the abdominal aorta.  The major branch vessels of the abdominal aorta, including and 80, are patent.  No retroperitoneal, mesenteric, pelvic or inguinal lymphadenopathy. Pelvic organs are normal.  No free fluid within the pelvis.  Limited visualization of the lower thorax demonstrates right basilar atelectasis.  No focal airspace opacity or pleural effusion.  Normal heart size.  No pericardial effusion.  No acute aggressive osseous abnormalities.  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  Complex intraabdominal abscess, the largest component of which measures approximately 6.4 cm in diameter.  The etiology of this abscess is not definitively identified on this examination.  There is scattered diverticuli within the transverse colon, however the colon is remote from the inflammatory process. No definite evidence of anastomotic breakdown from prior gastric bypass surgery.  No evidence of enteric obstruction.  2.  Approximately 1.4 cm stone within the left UVJ resulting in likely chronic left-sided hydronephrosis with associated atrophy of the left kidney.  Above findings discussed with Dr. Denton Lank (ED) at 11:11.  Original Report Authenticated By: Waynard Reeds, M.D.    Review of Systems  Constitutional: Positive for fever, chills and malaise/fatigue.  HENT: Negative.   Eyes: Negative.   Respiratory: Negative.   Cardiovascular: Negative.   Gastrointestinal: Positive for nausea and abdominal pain.  Genitourinary: Negative.   Musculoskeletal: Negative.   Neurological: Positive for weakness.  Endo/Heme/Allergies: Negative.  Negative for polydipsia.  Psychiatric/Behavioral: Negative.    Blood pressure 139/80, pulse 76, temperature 97.8 F (36.6 C), temperature source Oral, resp. rate 18, height 5' 6.5" (1.689 m), weight 97.523 kg (215 lb), SpO2 98.00%. Physical Exam  Nursing note and vitals reviewed. Constitutional: She is oriented to person, place, and time. She appears well-developed and well-nourished.  HENT:  Head: Normocephalic and atraumatic.  Eyes: Conjunctivae and EOM are normal. Pupils are equal, round, and reactive to light. Right eye exhibits no discharge. Left eye exhibits no discharge.  Neck: Normal range of motion. Neck supple.  Cardiovascular: Normal rate, regular rhythm and intact distal pulses.  Exam reveals friction rub. Exam reveals no gallop.  No murmur heard. Respiratory: Effort normal and breath sounds normal.  GI: Bowel sounds are normal.  She exhibits distension. There is tenderness. There is no rebound and no guarding.  Musculoskeletal: Normal range of motion.  Neurological: She is alert and oriented to person, place, and time.  Skin: Skin is warm and dry.    Assessment: Newly diagnosed diabetes mellitus type 2, which is poorly controlled. She likely has had diabetes mellitus for a while but this was only diagnosed recently after she started seeing physicians for health care maintenance. A hemoglobin A1C of 12 suggests poor control over the last 120 days or so. While metformin is great for her long term given her obesity, she needs priming with insulin till the hemoglobin A1C is less than 10 mg/dl. It is prudent to hold metformin in the acute setting as she may need more contrast. She is currently on an ACEI, ideal for hypertensive diabetics. Recommendations 1. DM 2, newly diagnosed, uncontrolled- would add lantus and continue SSI. I have changed the frequency of monitoring to tid acmeal qhs, and continued the sensitive scale. Gentle rehydration is ideal as you are doing. I have rechecked the HbA1C. Agree with holding metformin in the hospital setting. Catherine Engdahl is agreeable to discharge on some form of insulin, ideally lantus and aspart. She should follow with her endocrinologist for health care maintenance. Will also check lipids panel, ua and tsh. 2. Htn- continue acei. Increased the dose to optimize control. Needs monitoring of renal function. 3. Morbid obesity- life style change encouraged. 4. Diverticular abscess- defer management to surgery. 5. DVT prophylaxis. Thank you very much for involving [place] in the care of Catherine Sneed. We will follow with you during the course of this hospitalization.    Tesean Stump pager#[phone]. 09/24/2011, 5:57 PM

## 2011-09-24 NOTE — Progress Notes (Signed)
Subjective: Intraabdominal abscess; possible diverticulitis S/P CT Guided drainage in radiology today.  Pt just had a regular diet.  CT shows a 1.4x1.1 cm stone L UVJ with chronic L kidney atrophy, and now has a rising creatnine. Major complaint now is a headache.  Objective: Vital signs in last 24 hours: Temp:  [97.8 F (36.6 C)-99.2 F (37.3 C)] 97.8 F (36.6 C) (11/12 1415) Pulse Rate:  [71-90] 76  (11/12 1415) Resp:  [13-21] 18  (11/12 1415) BP: (105-152)/(59-80) 139/80 mmHg (11/12 1415) SpO2:  [95 %-100 %] 98 % (11/12 1415) Weight:  [97.523 kg (215 lb)] 215 lb (97.523 kg) (11/11 2345) Last BM Date: 09/22/11  Intake/Output from previous day: 11/11 0701 - 11/12 0700 In: 1638.3 [I.V.:1338.3; IV Piggyback:300] Out: 350 [Urine:350] Intake/Output this shift:    General appearance: alert, cooperative and no distress Resp: clear to auscultation bilaterally GI: soft, non-tender; bowel sounds normal; no masses,  no organomegaly and Drain present with redish cloudy drainage.  Lab Results:   [facility] 09/24/11 0523 09/23/11 0858  WBC 11.7* 12.3*  HGB 9.4* 10.8*  HCT 31.2* 35.0*  PLT 434* 499*    BMET  Basename 09/24/11 0523 09/23/11 0858  NA 136 137  K 4.1 4.1  CL 103 101  CO2 25 26  GLUCOSE 172* 173*  BUN 15 17  CREATININE 1.34* 1.12*  CALCIUM 8.6 9.3   PT/INR  Basename 09/23/11 1405  LABPROT 13.8  INR 1.04     Studies/Results: Ct Guided Abscess Drain  09/24/2011  Casimiro Needle T. Shick     09/24/2011 12:13 PM Successful CT guided anterior abdominal abscess drain  placement(70fr) 30cc pus aspirated and sent for GS and CX No comp Stable    Ct Abdomen Pelvis W Contrast  09/23/2011  *RADIOLOGY REPORT*  Clinical Data: Abdominal pain for 10 days  CT ABDOMEN AND PELVIS WITH CONTRAST  Technique:  Multidetector CT imaging of the abdomen and pelvis was performed following the standard protocol during bolus administration of intravenous contrast.  Contrast: OMNIPAQUE  IOHEXOL 300 MG/ML IV SOLN  Comparison: None.  Findings:  Normal hepatic contour.  No discrete hepatic lesions.  No ascites. The gallbladder surgically absent.  No ascites.  The left kidney is atrophic with likely chronic left-sided severe hydronephrosis secondary to an approximately 1.4 x 1.1 cm stone within the left UVJ. This finding is associated with expected delayed excretion on the left kidney.  There is normal excretion from the right kidney.  Punctate hypoattenuating lesion within the inferior aspect of the right kidney is too small to fully characterize and may represent a renal cyst. Bilateral adrenal glands are normal.  Normal pancreas.  Punctate hypoattenuating lesion within the spleen (image 20) is too small to accurate characterize.  Incidental note is made of a small splenule.  Sequela of prior gastric bypass.  Enteric contrast extends to the cecum.  No evidence of enteric obstruction. There is a serpiginous peripheral enhancing fluid collection within the anterior abdominal mesentery with dominant fluid collection measuring approximately measuring approximately 6.4 x 5.4 cm (image 50, series 2), and contains several small foci of air.  There is associated wall thickening involving the adjacent loops of small bowel (image 55, series 2), however there is no definite extravasation of enteric ingested contrast.  A portion of this fluid collection extends through the midline anterior abdominal wall incisional hernia (image 52).  A minimal amount of fluid tracks within the adjacent ventral mesentery to a small bowel anastomosis within the left lower  abdominal quadrant (image 40, series 2).  No definite pneumatosis or portal venous gas.  Normal caliber of the abdominal aorta.  The major branch vessels of the abdominal aorta, including and 80, are patent.  No retroperitoneal, mesenteric, pelvic or inguinal lymphadenopathy. Pelvic organs are normal.  No free fluid within the pelvis.  Limited visualization of  the lower thorax demonstrates right basilar atelectasis.  No focal airspace opacity or pleural effusion.  Normal heart size.  No pericardial effusion.  No acute aggressive osseous abnormalities.  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  Complex intraabdominal abscess, the largest component of which measures approximately 6.4 cm in diameter.  The etiology of this abscess is not definitively identified on this examination.  There is scattered diverticuli within the transverse colon, however the colon is remote from the inflammatory process. No definite evidence of anastomotic breakdown from prior gastric bypass surgery.  No evidence of enteric obstruction.  2.  Approximately 1.4 cm stone within the left UVJ resulting in likely chronic left-sided hydronephrosis with associated atrophy of the left kidney.  Above findings discussed with Dr. Denton Lank (ED) at 11:11.  Original Report Authenticated By: Waynard Reeds, M.D.    Anti-infectives: Anti-infectives     Start     Dose/Rate Route Frequency Ordered Stop   09/23/11 1800   metroNIDAZOLE (FLAGYL) IVPB 500 mg        500 mg 100 mL/hr over 60 Minutes Intravenous Every 8 hours 09/23/11 1336     09/23/11 1700   ciprofloxacin (CIPRO) IVPB 400 mg        400 mg 200 mL/hr over 60 Minutes Intravenous Every 12 hours 09/23/11 1336     09/23/11 1300   piperacillin-tazobactam (ZOSYN) IVPB 3.375 g  Status:  Discontinued        3.375 g 12.5 mL/hr over 240 Minutes Intravenous  Once 09/23/11 1156 09/23/11 1336         Current Facility-Administered Medications  Medication Dose Route Frequency Provider Last Rate Last Dose  . 0.9 %  sodium chloride infusion   Intravenous Continuous Rulon Abide, DO 100 mL/hr at 09/24/11 1032    . ciprofloxacin (CIPRO) IVPB 400 mg  400 mg Intravenous Q12H Rulon Abide, DO   400 mg at 09/24/11 0535  . enoxaparin (LOVENOX) injection 40  mg  40 mg Subcutaneous Q24H Rulon Abide, DO   40 mg at 09/23/11 1736  . fentaNYL (SUBLIMAZE) injection   Intravenous PRN Casimiro Needle T. Shick   100 mcg at 09/24/11 1154  . HYDROcodone-acetaminophen (NORCO) 5-325 MG per tablet 1-2 tablet  1-2 tablet Oral Q4H PRN Rulon Abide, DO   2 tablet at 09/23/11 1737  . insulin aspart (novoLOG) injection 0-24 Units  0-24 Units Subcutaneous Q2H Rulon Abide, DO   10 Units at 09/23/11 1818   Followed by  . insulin aspart (novoLOG) injection 0-24 Units  0-24 Units Subcutaneous Q4H Rulon Abide, DO   2 Units at 09/24/11 0800  . lisinopril (PRINIVIL,ZESTRIL) tablet 5 mg  5 mg Oral Daily Rulon Abide, DO   5 mg at 09/23/11 1817  . LORazepam (ATIVAN) injection   Intravenous PRN Casimiro Needle T. Shick      . metroNIDAZOLE (FLAGYL) IVPB 500 mg  500 mg Intravenous Q8H Rulon Abide, DO   500 mg at 09/24/11 1610  . midazolam (VERSED) 5 MG/5ML injection   Intravenous  PRN Inocente Salles. Shick   2 mg at 09/24/11 1155  . morphine 2 MG/ML injection 2 mg  2 mg Intravenous Q1H PRN Rulon Abide, DO   2 mg at 09/23/11 1657  . ondansetron (ZOFRAN) injection 4 mg  4 mg Intravenous Q6H PRN Rulon Abide, DO        Assessment/Plan   1. Abdominal pain with drainage of abscess. Possible diverticulitis. 2.L UJV with hydronephrosis. LOS: 1 day  3. Acute renal failure. 4. Obesity 5. Possible diabetes Plan:  Clear liquids, antibiotics, ask Urology and Medicine to see.   Catherine Fowler,Catherine Fowler 09/24/2011

## 2011-09-24 NOTE — Progress Notes (Signed)
INITIAL ADULT NUTRITION ASSESSMENT Date: 09/24/2011   Time: 5:13 PM Reason for Assessment: Health history  ASSESSMENT: Female 60 y.o.  Dx: Abdominal pain  Hx:  Past Medical History  Diagnosis Date  . Diabetes mellitus    Related Meds: Scheduled Meds:   . ciprofloxacin  400 mg Intravenous Q12H  . enoxaparin  40 mg Subcutaneous Q24H  . insulin aspart  0-24 Units Subcutaneous Q2H   Followed by  . insulin aspart  0-24 Units Subcutaneous Q4H  . lisinopril  5 mg Oral Daily  . metronidazole  500 mg Intravenous Q8H   Continuous Infusions:   . sodium chloride 100 mL/hr at 09/24/11 1032   PRN Meds:.acetaminophen, fentaNYL, HYDROcodone-acetaminophen, LORazepam, midazolam, morphine, ondansetron  Ht: 5' 6.5" (168.9 cm)  Wt: 215 lb (97.523 kg)  Ideal Wt: 60.45 kg  % Ideal Wt: 161  Usual Wt: 86.3kg % Usual Wt: 113  Body mass index is 34.18 kg/(m^2).  Food/Nutrition Related Hx: Pt reports good appetite PTA. Pt reports unintentional 20 pound weight gain in the past 2 months, likely fluid related as pt c/o abdominal distention and noted complex intraabdominal abscess found on CT of abdomen and pelvis yesterday. Pt eager to be on regular food, was disappointed that she is on a clear liquid diet. Pt tolerating diet well.   Labs:  CMP     Component Value Date/Time   NA 136 09/24/2011 0523   K 4.1 09/24/2011 0523   CL 103 09/24/2011 0523   CO2 25 09/24/2011 0523   GLUCOSE 172* 09/24/2011 0523   BUN 15 09/24/2011 0523   CREATININE 1.34* 09/24/2011 0523   CALCIUM 8.6 09/24/2011 0523   PROT 5.9* 09/24/2011 0523   ALBUMIN 2.4* 09/24/2011 0523   AST 12 09/24/2011 0523   ALT 8 09/24/2011 0523   ALKPHOS 106 09/24/2011 0523   BILITOT 0.2* 09/24/2011 0523   GFRNONAA 41* 09/24/2011 0523   GFRAA 48* 09/24/2011 0523   CBG (last 3)   Basename 09/24/11 1553 09/24/11 0758 09/24/11 0359  GLUCAP 233* 160* 164*    Intake/Output Summary (Last 24 hours) at 09/24/11 1718 Last data  filed at 09/24/11 1300  Gross per 24 hour  Intake 1638.33 ml  Output    350 ml  Net 1288.33 ml    Diet Order: Clear Liquid  IVF:    sodium chloride Last Rate: 100 mL/hr at 09/24/11 1032    Estimated Nutritional Needs:   Kcal:1600-1950 Protein:90-105g Fluid:1.6-1.9L  NUTRITION DIAGNOSIS: -Increased nutrient needs (NI-5.1).  Status: Ongoing  RELATED TO: complex intraabdominal abscess  AS EVIDENCE BY: CT of abdomen/pelvis performed 09/23/11  MONITORING/EVALUATION(Goals): Advance diet as tolerated to regular diet  EDUCATION NEEDS: -No education needs identified at this time  INTERVENTION: Diet advancement per MD. Will monitor.   Dietitian # [phone]  DOCUMENTATION CODES Per approved criteria  -Obesity Unspecified    Catherine Fowler 09/24/2011, 5:13 PM

## 2011-09-24 NOTE — Progress Notes (Signed)
Patient returned from procedure. No c/o pain. Has new jp drain to right lower abd quadrant. Iv patent. Received order for regular diet, but was again changed back to clear liquids later in day

## 2011-09-24 NOTE — Procedures (Signed)
Successful CT guided anterior abdominal abscess drain placement(45fr) 30cc pus aspirated and sent for GS and CX No comp Stable

## 2011-09-24 NOTE — Interval H&P Note (Signed)
History and Physical Interval Note:   09/24/2011   10:08 AM   Catherine Fowler is scheduled for CT guided drainage of anterior abdominal abscess today.  The various methods of treatment have been discussed with the patient and family. After consideration of risks, benefits and other options for treatment, the patient has consented to the above procedure.  The patients' history has been reviewed, patient examined, no change in status, stable for the above procedure.  I have reviewed the patients' chart and labs.  Questions were answered to the patient's satisfaction.     Jeananne Rama, PA-C

## 2011-09-25 LAB — GLUCOSE, CAPILLARY
Glucose-Capillary: 194 mg/dL — ABNORMAL HIGH (ref 70–99)
Glucose-Capillary: 139 mg/dL — ABNORMAL HIGH (ref 70–99)
Glucose-Capillary: 161 mg/dL — ABNORMAL HIGH (ref 70–99)
Glucose-Capillary: 157 mg/dL — ABNORMAL HIGH (ref 70–99)

## 2011-09-25 LAB — COMPREHENSIVE METABOLIC PANEL
ALT: 7 U/L (ref 0–35)
AST: 11 U/L (ref 0–37)
CO2: 24 mEq/L (ref 19–32)
Chloride: 104 mEq/L (ref 96–112)
GFR calc Af Amer: 58 mL/min — ABNORMAL LOW (ref >90)
GFR calc non Af Amer: 50 mL/min — ABNORMAL LOW (ref >90)
Glucose, Bld: 163 mg/dL — ABNORMAL HIGH (ref 70–99)
Sodium: 137 mEq/L (ref 135–145)
Total Bilirubin: 0.2 mg/dL — ABNORMAL LOW (ref 0.3–1.2)

## 2011-09-25 LAB — CBC
Hemoglobin: 9.4 g/dL — ABNORMAL LOW (ref 12.0–15.0)
MCH: 23.8 pg — ABNORMAL LOW (ref 26.0–34.0)
MCV: 78.2 fL (ref 78.0–100.0)
Platelets: 400 10*3/uL (ref 150–400)
RBC: 3.95 MIL/uL (ref 3.87–5.11)
WBC: 8.1 10*3/uL (ref 4.0–10.5)

## 2011-09-25 MED ORDER — LIVING WELL WITH DIABETES BOOK
Freq: Once | Status: DC
  Filled 2011-09-25: qty 1

## 2011-09-25 MED ORDER — INSULIN PEN STARTER KIT
1.0000 | Freq: Once | Status: AC
  Administered 2011-09-25: 1
  Filled 2011-09-25: qty 1

## 2011-09-25 MED ORDER — LIVING WELL WITH DIABETES BOOK
Freq: Once | Status: AC
  Administered 2011-09-25: 16:00:00
  Filled 2011-09-25: qty 1

## 2011-09-25 NOTE — Progress Notes (Signed)
Subjective: No real problems, doesn't like clears, to salty. Abd. feels better.  About 10cc in drain this AM  Cloudy white in color.  Objective: Vital signs in last 24 hours: Temp:  [97.8 F (36.6 C)-98.8 F (37.1 C)] 98.2 F (36.8 C) (11/13 0455) Pulse Rate:  [69-90] 77  (11/13 0455) Resp:  [13-21] 20  (11/13 0455) BP: (123-152)/(59-80) 123/77 mmHg (11/13 0455) SpO2:  [95 %-100 %] 96 % (11/13 0455) Last BM Date: 09/23/11  Intake/Output from previous day: 11/12 0701 - 11/13 0700 In: 1600 [I.V.:1000; IV Piggyback:600] Out: 825 [Urine:800] Intake/Output this shift: Total I/O In: 360 [P.O.:360] Out: 400 [Urine:400]  General appearance: alert, cooperative and no distress Resp: clear to auscultation bilaterally GI: soft, non-tender; bowel sounds normal; no masses,  no organomegaly and Drain is working well  Lab Results:   Basename 09/25/11 0440 09/24/11 0523  WBC 8.1 11.7*  HGB 9.4* 9.4*  HCT 30.9* 31.2*  PLT 400 434*    BMET  Basename 09/25/11 0440 09/24/11 0523  NA 137 136  K 3.7 4.1  CL 104 103  CO2 24 25  GLUCOSE 163* 172*  BUN 10 15  CREATININE 1.14* 1.34*  CALCIUM 8.5 8.6   PT/INR  Basename 09/23/11 1405  LABPROT 13.8  INR 1.04     Studies/Results: Ct Guided Abscess Drain  09/24/2011  Casimiro Needle T. Shick     09/24/2011 12:13 PM Successful CT guided anterior abdominal abscess drain  placement(57fr) 30cc pus aspirated and sent for GS and CX No comp Stable    Ct Abdomen Pelvis W Contrast  09/23/2011  *RADIOLOGY REPORT*  Clinical Data: Abdominal pain for 10 days  CT ABDOMEN AND PELVIS WITH CONTRAST  Technique:  Multidetector CT imaging of the abdomen and pelvis was performed following the standard protocol during bolus administration of intravenous contrast.  Contrast: OMNIPAQUE IOHEXOL 300 MG/ML IV SOLN  Comparison: None.  Findings:  Normal hepatic contour.  No discrete hepatic lesions.  No ascites. The gallbladder surgically absent.  No ascites.   The left kidney is atrophic with likely chronic left-sided severe hydronephrosis secondary to an approximately 1.4 x 1.1 cm stone within the left UVJ. This finding is associated with expected delayed excretion on the left kidney.  There is normal excretion from the right kidney.  Punctate hypoattenuating lesion within the inferior aspect of the right kidney is too small to fully characterize and may represent a renal cyst. Bilateral adrenal glands are normal.  Normal pancreas.  Punctate hypoattenuating lesion within the spleen (image 20) is too small to accurate characterize.  Incidental note is made of a small splenule.  Sequela of prior gastric bypass.  Enteric contrast extends to the cecum.  No evidence of enteric obstruction. There is a serpiginous peripheral enhancing fluid collection within the anterior abdominal mesentery with dominant fluid collection measuring approximately measuring approximately 6.4 x 5.4 cm (image 50, series 2), and contains several small foci of air.  There is associated wall thickening involving the adjacent loops of small bowel (image 55, series 2), however there is no definite extravasation of enteric ingested contrast.  A portion of this fluid collection extends through the midline anterior abdominal wall incisional hernia (image 52).  A minimal amount of fluid tracks within the adjacent ventral mesentery to a small bowel anastomosis within the left lower abdominal quadrant (image 40, series 2).  No definite pneumatosis or portal venous gas.  Normal caliber of the abdominal aorta.  The major branch vessels of the abdominal  aorta, including and 80, are patent.  No retroperitoneal, mesenteric, pelvic or inguinal lymphadenopathy. Pelvic organs are normal.  No free fluid within the pelvis.  Limited visualization of the lower thorax demonstrates right basilar atelectasis.  No focal airspace opacity or pleural effusion.  Normal heart size.  No pericardial effusion.  No acute aggressive  osseous abnormalities.  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  Complex intraabdominal abscess, the largest component of which measures approximately 6.4 cm in diameter.  The etiology of this abscess is not definitively identified on this examination.  There is scattered diverticuli within the transverse colon, however the colon is remote from the inflammatory process. No definite evidence of anastomotic breakdown from prior gastric bypass surgery.  No evidence of enteric obstruction.  2.  Approximately 1.4 cm stone within the left UVJ resulting in likely chronic left-sided hydronephrosis with associated atrophy of the left kidney.  Above findings discussed with Dr. Denton Lank (ED) at 11:11.  Original Report Authenticated By: Waynard Reeds, M.D.    Anti-infectives: Anti-infectives     Start     Dose/Rate Route Frequency Ordered Stop   09/23/11 1800   metroNIDAZOLE (FLAGYL) IVPB 500 mg        500 mg 100 mL/hr over 60 Minutes Intravenous Every 8 hours 09/23/11 1336     09/23/11 1700   ciprofloxacin (CIPRO) IVPB 400 mg        400 mg 200 mL/hr over 60 Minutes Intravenous Every 12 hours 09/23/11 1336     09/23/11 1300   piperacillin-tazobactam (ZOSYN) IVPB 3.375 g  Status:  Discontinued        3.375 g 12.5 mL/hr over 240 Minutes Intravenous  Once 09/23/11 1156 09/23/11 1336         Current Facility-Administered Medications  Medication Dose Route Frequency Provider Last Rate Last Dose  . 0.9 %  sodium chloride infusion   Intravenous Continuous Rulon Abide, DO 100 mL/hr at 09/25/11 0135    . acetaminophen (TYLENOL) tablet 650 mg  650 mg Oral Q4H PRN Sherrie George, PA   650 mg at 09/24/11 1546  . ciprofloxacin (CIPRO) IVPB 400 mg  400 mg Intravenous Q12H Rulon Abide, DO   400 mg at 09/25/11 0502  . enoxaparin (LOVENOX) injection 40 mg  40 mg Subcutaneous Q24H Rulon Abide, DO   40 mg at  09/24/11 1637  . fentaNYL (SUBLIMAZE) injection   Intravenous PRN Casimiro Needle T. Shick   100 mcg at 09/24/11 1154  . HYDROcodone-acetaminophen (NORCO) 5-325 MG per tablet 1-2 tablet  1-2 tablet Oral Q4H PRN Rulon Abide, DO   2 tablet at 09/23/11 1737  . insulin aspart (novoLOG) injection 0-9 Units  0-9 Units Subcutaneous TID WC Simbiso Ranga      . insulin glargine (LANTUS) injection 8 Units  8 Units Subcutaneous QHS Simbiso Ranga      . lisinopril (PRINIVIL,ZESTRIL) tablet 10 mg  10 mg Oral Daily Simbiso Ranga      . LORazepam (ATIVAN) injection   Intravenous PRN Casimiro Needle T. Shick      . metroNIDAZOLE (FLAGYL) IVPB 500 mg  500 mg Intravenous Q8H Rulon Abide, DO   500 mg at 09/25/11 1610  . midazolam (VERSED) 5 MG/5ML injection   Intravenous PRN Michael T. Shick   2 mg at 09/24/11 1155  . morphine 2 MG/ML injection 2 mg  2 mg Intravenous Q1H  PRN Rulon Abide, DO   2 mg at 09/23/11 1657  . ondansetron (ZOFRAN) injection 4 mg  4 mg Intravenous Q6H PRN Rulon Abide, DO      . DISCONTD: insulin aspart (novoLOG) injection 0-24 Units  0-24 Units Subcutaneous Q4H Rulon Abide, DO   8 Units at 09/24/11 1637  . DISCONTD: lisinopril (PRINIVIL,ZESTRIL) tablet 5 mg  5 mg Oral Daily Rulon Abide, DO   5 mg at 09/23/11 1817    Assessment/Plan   1.Abdominal abscess with Percutaneous drain 2. AODM being followed by DR. Ranga 3. Renal insuffiency 4.Left UJV stone with hydronephrosis  Urology to see. Plan: continue ABx and drain.  LOS: 2 days    Fowler,Catherine 09/25/2011

## 2011-09-25 NOTE — Progress Notes (Signed)
  Lt abd abscess drain placed 11/12.  O: afeb; 123/77 Output: 25cc 11/12 5-10cc in JP now Output brown color Cx: mod g+ cocci Site: clean and dry/NT  A/P:  Lt abd drain intact Continue for now Surgery following

## 2011-09-25 NOTE — Consult Note (Signed)
Consult Diagnosis: Left renal stone, left hydronephrosis Requested by: Lynann Beaver, MD  History of Present Illness:  60 yo female admitted for abdominal pain and abdominal abscess from diverticulitis. CT scan Abd/Pelvis Sep 23, 2011 revealed an anterior abdominal abscess which has been perc drained. Also on CT was an atrophic left kidney obstructed by a 2.1 cm UPJ stone with no function on delayed imaging. Her right kidney appeared normal. I reviewed all the images.  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 She has no history of kidney stones and was not aware of a left renal stone. She has had no flank pain. No dysuria. No hematuria. No lower urinary tract symptoms. No urologic surgery. She has DM and HTN as well.  Past Medical History  Diagnosis Date  . Diabetes mellitus    Past Surgical History  Procedure Date  . Abdominal surgery   . Appendectomy   . Cholecystectomy   . Tubal ligation     Home Medications:  Prescriptions prior to admission  Medication Sig Dispense Refill  . lisinopril (PRINIVIL,ZESTRIL) 5 MG tablet Take 5 mg by mouth daily.       . metFORMIN (GLUCOPHAGE) 500 MG tablet Take 500 mg by mouth 2 (two) times daily with a meal.       . naproxen sodium (ANAPROX) 220 MG tablet Take by mouth every 8 (eight) hours as needed. 1-2 tablets. For pain.       Marland Kitchen OVER THE COUNTER MEDICATION Take 1 each by mouth daily. Gummy Fiber Vitamins.        Allergies: No Known Allergies  Family History  Problem Relation Age of Onset  . Diabetes type II Father   . Diabetes type II Sister   . Diabetes type II Other   . Diabetes type II Maternal Aunt    Social History: Patient is a Clinical research associate.  reports that she has never smoked. She does not have any smokeless tobacco history on file. She reports that she drinks alcohol. She reports that she does not use illicit drugs.  ROS: A complete review of systems was performed.  All systems are  negative except for pertinent findings as noted. @ROS @  Physical Exam:  Vital signs in last 24 hours: Temp:  [98.2 F (36.8 C)-98.8 F (37.1 C)] 98.7 F (37.1 C) (11/13 1400) Pulse Rate:  [69-77] 71  (11/13 1400) Resp:  [18-20] 20  (11/13 1400) BP: (123)/(70-77) 123/77 mmHg (11/13 0455) SpO2:  [95 %-96 %] 96 % (11/13 1400) General:  Alert and oriented, No acute distress HEENT: Normocephalic, atraumatic Neck: No JVD or lymphadenopathy Cardiovascular: Regular rate and rhythm Lungs: Clear bilaterally Abdomen: Soft, nontender, nondistended, no abdominal masses, perc drain in place Back: No CVA tenderness Extremities: No edema Neurologic: Grossly intact  Laboratory Data:  Results for orders placed during the hospital encounter of 09/23/11 (from the past 24 hour(s))  GLUCOSE, CAPILLARY     Status: Abnormal   Collection Time   09/25/11 12:28 AM      Component Value Range   Glucose-Capillary 145 (*) 70 - 99 (mg/dL)   Comment 1 Notify RN    GLUCOSE, CAPILLARY     Status: Abnormal   Collection Time   09/25/11  3:55 AM      Component Value Range   Glucose-Capillary 142 (*) 70 - 99 (mg/dL)  CBC     Status: Abnormal   Collection Time   09/25/11  4:40 AM  Component Value Range   WBC 8.1  4.0 - 10.5 (K/uL)   RBC 3.95  3.87 - 5.11 (MIL/uL)   Hemoglobin 9.4 (*) 12.0 - 15.0 (g/dL)   HCT 16.1 (*) 09.6 - 46.0 (%)   MCV 78.2  78.0 - 100.0 (fL)   MCH 23.8 (*) 26.0 - 34.0 (pg)   MCHC 30.4  30.0 - 36.0 (g/dL)   RDW 04.5  40.9 - 81.1 (%)   Platelets 400  150 - 400 (K/uL)  COMPREHENSIVE METABOLIC PANEL     Status: Abnormal   Collection Time   09/25/11  4:40 AM      Component Value Range   Sodium 137  135 - 145 (mEq/L)   Potassium 3.7  3.5 - 5.1 (mEq/L)   Chloride 104  96 - 112 (mEq/L)   CO2 24  19 - 32 (mEq/L)   Glucose, Bld 163 (*) 70 - 99 (mg/dL)   BUN 10  6 - 23 (mg/dL)   Creatinine, Ser 9.14 (*) 0.50 - 1.10 (mg/dL)   Calcium 8.5  8.4 - 78.2 (mg/dL)   Total Protein 5.9 (*)  6.0 - 8.3 (g/dL)   Albumin 2.4 (*) 3.5 - 5.2 (g/dL)   AST 11  0 - 37 (U/L)   ALT 7  0 - 35 (U/L)   Alkaline Phosphatase 103  39 - 117 (U/L)   Total Bilirubin 0.2 (*) 0.3 - 1.2 (mg/dL)   GFR calc non Af Amer 50 (*) >90 (mL/min)   GFR calc Af Amer 58 (*) >90 (mL/min)  MAGNESIUM     Status: Normal   Collection Time   09/25/11  4:40 AM      Component Value Range   Magnesium 1.8  1.5 - 2.5 (mg/dL)  HEMOGLOBIN N5A     Status: Abnormal   Collection Time   09/25/11  4:40 AM      Component Value Range   Hemoglobin A1C 12.0 (*) <5.7 (%)   Mean Plasma Glucose 298 (*) <117 (mg/dL)  TSH     Status: Normal   Collection Time   09/25/11  4:40 AM      Component Value Range   TSH 1.108  0.350 - 4.500 (uIU/mL)  MICROALBUMIN, URINE     Status: Normal   Collection Time   09/25/11  8:00 AM      Component Value Range   Microalb, Ur 0.90  0.00 - 1.89 (mg/dL)  GLUCOSE, CAPILLARY     Status: Abnormal   Collection Time   09/25/11  8:01 AM      Component Value Range   Glucose-Capillary 194 (*) 70 - 99 (mg/dL)  GLUCOSE, CAPILLARY     Status: Abnormal   Collection Time   09/25/11 11:50 AM      Component Value Range   Glucose-Capillary 139 (*) 70 - 99 (mg/dL)   Comment 1 Notify RN     Comment 2 Documented in Chart    GLUCOSE, CAPILLARY     Status: Abnormal   Collection Time   09/25/11  4:17 PM      Component Value Range   Glucose-Capillary 161 (*) 70 - 99 (mg/dL)   Comment 1 Notify RN     Comment 2 Documented in Chart     Recent Results (from the past 240 hour(s))  URINE CULTURE     Status: Normal   Collection Time   09/23/11 11:13 AM      Component Value Range Status Comment   Specimen Description URINE, CLEAN  CATCH   Final    Special Requests NONE   Final    Setup Time [id number]   Final    Colony Count 40,000 COLONIES/ML   Final    Culture     Final    Value: Multiple bacterial morphotypes present, none predominant. Suggest appropriate recollection if clinically indicated.   Report  Status 09/24/2011 FINAL   Final   ANAEROBIC CULTURE     Status: Normal (Preliminary result)   Collection Time   09/24/11 12:54 PM      Component Value Range Status Comment   Specimen Description ABSCESS ABDOMEN   Final    Special Requests NONE   Final    Gram Stain     Final    Value: ABUNDANT WBC PRESENT, PREDOMINANTLY MONONUCLEAR     NO SQUAMOUS EPITHELIAL CELLS SEEN     MODERATE GRAM POSITIVE COCCI     IN PAIRS IN CHAINS MODERATE GRAM POSITIVE RODS   Culture     Final    Value: NO ANAEROBES ISOLATED; CULTURE IN PROGRESS FOR 5 DAYS   Report Status PENDING   Incomplete   CULTURE, ROUTINE-ABSCESS     Status: Normal (Preliminary result)   Collection Time   09/24/11 12:54 PM      Component Value Range Status Comment   Specimen Description ABDOMEN ABSCESS   Final    Special Requests NONE   Final    Gram Stain PENDING   Incomplete    Culture Culture reincubated for better growth   Final    Report Status PENDING   Incomplete    Creatinine:  Basename 09/25/11 0440 09/24/11 0523 09/23/11 0858  CREATININE 1.14* 1.34* 1.12*   CT A/P Sep 23, 2011:  IMPRESSION:  1. Complex intraabdominal abscess, the largest component of which  measures approximately 6.4 cm in diameter. The etiology of this  abscess is not definitively identified on this examination. There  is scattered diverticuli within the transverse colon, however the  colon is remote from the inflammatory process. No definite evidence  of anastomotic breakdown from prior gastric bypass surgery. No  evidence of enteric obstruction.  2. Approximately 1.4 cm x 2.1 cm stone within the left UPJ resulting in  likely chronic left-sided hydronephrosis with associated atrophy of  the left kidney.   Impression/Assessment:  Large UPJ stone in obstructed, atrophic kidney in patient with metabolic and comorbidities (HTN, DM) which could lead to future renal insufficiency.  Plan:  Patient has no UTI or flank pain and doesn't warrant  urgent intervention for this long standing obstruction. I discussed the findings with the patient and showed her the CT images. We discussed the left kidney is poorly functioning, but her Cr is adequate with adequate right renal function. We discussed risks of surveillance or doing nothing such as the stone could get larger, the patient could develop infection of the left kidney and/or the right kidney could eventually fail due to her DM, HTN, etc. We discussed options to remove the stone such as the nature, risks and benefits of URS/laser lithotripsy, left ESWL and left PCNL (bleeding, infection, injury to adjacent structures such as lung and bowel among others). Given stone size, density on CT and atrophy of the kidney she would likely need PCNL. We discussed ways to determine if the left kidney has enough residual function to warrant intervention such as cysto/stent placement or left perc nephrostomy. All questions answered. She will consider and was instructed to go by [facility] and get  a copy of her old CT/xray. I gave her my card with name and contact info to f/u in the next few weeks in the office.  Antony Haste 09/25/2011, 8:05 PM

## 2011-09-25 NOTE — Progress Notes (Signed)
Catherine Fowler is a 60 y.o. female patient.  SUBJECTIVE Patient feels better today. Her HbA1C is 12 mg/dl. I discussed the implications of being a newly diagnosed diabetic with Catherine Fowler and she expressed understanding. She is agreeable to d/c on an insulin regimen(I would recommend Lantus 10 units SQ qhs, and aspart 5 units SQ before meals). Catherine Fowler will check fingersticks before meals and take the record to her pcp/endocrinologist for further adjustment of the insulin regimen. I would not resume metformin till abdominal issues have resolved.   No diagnosis found.  Past Medical History  Diagnosis Date  . Diabetes mellitus    Current Facility-Administered Medications  Medication Dose Route Frequency Provider Last Rate Last Dose  . 0.9 %  sodium chloride infusion   Intravenous Continuous Rulon Abide, DO 100 mL/hr at 09/25/11 0135    . acetaminophen (TYLENOL) tablet 650 mg  650 mg Oral Q4H PRN Sherrie George, PA   650 mg at 09/24/11 1546  . ciprofloxacin (CIPRO) IVPB 400 mg  400 mg Intravenous Q12H Rulon Abide, DO   400 mg at 09/25/11 0502  . enoxaparin (LOVENOX) injection 40 mg  40 mg Subcutaneous Q24H Rulon Abide, DO   40 mg at 09/24/11 1637  . fentaNYL (SUBLIMAZE) injection   Intravenous PRN Casimiro Needle T. Shick   100 mcg at 09/24/11 1154  . HYDROcodone-acetaminophen (NORCO) 5-325 MG per tablet 1-2 tablet  1-2 tablet Oral Q4H PRN Rulon Abide, DO   2 tablet at 09/23/11 1737  . insulin aspart (novoLOG) injection 0-9 Units  0-9 Units Subcutaneous TID WC Arlo Butt   2 Units at 09/25/11 0800  . insulin glargine (LANTUS) injection 8 Units  8 Units Subcutaneous QHS Adison Reifsteck      . lisinopril (PRINIVIL,ZESTRIL) tablet 10 mg  10 mg Oral Daily Macklen Wilhoite   10 mg at 09/25/11 1016  . LORazepam (ATIVAN) injection   Intravenous PRN Casimiro Needle T. Shick      . metroNIDAZOLE (FLAGYL) IVPB 500 mg  500 mg Intravenous Q8H Rulon Abide, DO   500 mg at 09/25/11 1017  .  midazolam (VERSED) 5 MG/5ML injection   Intravenous PRN Casimiro Needle T. Shick   2 mg at 09/24/11 1155  . morphine 2 MG/ML injection 2 mg  2 mg Intravenous Q1H PRN Rulon Abide, DO   2 mg at 09/23/11 1657  . ondansetron (ZOFRAN) injection 4 mg  4 mg Intravenous Q6H PRN Rulon Abide, DO      . DISCONTD: insulin aspart (novoLOG) injection 0-24 Units  0-24 Units Subcutaneous Q4H Rulon Abide, DO   8 Units at 09/24/11 1637  . DISCONTD: lisinopril (PRINIVIL,ZESTRIL) tablet 5 mg  5 mg Oral Daily Rulon Abide, DO   5 mg at 09/23/11 1817   No Known Allergies Active Problems:  DM (diabetes mellitus), type 2, uncontrolled  HTN (hypertension)  Obesity  Intra-abdominal abscess  Nephrolithiasis  Hydronephrosis  ARF (acute renal failure)   Vital signs in last 24 hours: Temp:  [97.8 F (36.6 C)-98.8 F (37.1 C)] 98.2 F (36.8 C) (11/13 0455) Pulse Rate:  [69-90] 77  (11/13 0455) Resp:  [13-21] 20  (11/13 0455) BP: (123-152)/(59-80) 123/77 mmHg (11/13 0455) SpO2:  [95 %-100 %] 96 % (11/13 0455) Weight change:  Last BM Date: 09/25/11  Intake/Output from previous day: 11/12 0701 - 11/13 0700 In: 1600 [I.V.:1000; IV Piggyback:600] Out: 825 [Urine:800] Intake/Output this shift: Total I/O In: 360 [P.O.:360] Out: 750 [Urine:750]  Lab Results:  [facility] 09/25/11 0440 09/24/11 0523  WBC 8.1 11.7*  HGB 9.4* 9.4*  HCT 30.9* 31.2*  PLT 400 434*   BMET  Basename 09/25/11 0440 09/24/11 0523  NA 137 136  K 3.7 4.1  CL 104 103  CO2 24 25  GLUCOSE 163* 172*  BUN 10 15  CREATININE 1.14* 1.34*  CALCIUM 8.5 8.6    Studies/Results: Ct Guided Abscess Drain  09/24/2011  Casimiro Needle T. Shick     09/24/2011 12:13 PM Successful CT guided anterior abdominal abscess drain  placement(62fr) 30cc pus aspirated and sent for GS and CX No comp Stable     Medications: I have reviewed the patient's current medications.   Physical exam GENERAL- alert, robust and well HEAD- normal  atraumatic, no neck masses, normal thyroid, no jvd RESPIRATORY- chest clear, no wheezing, crepitations, rhonchi, normal symmetric air entry CVS- regular rate and rhythm, S1, S2 normal, no murmur, click, rub or gallop ABDOMEN- drain has pus like material. NEURO- Grossly normal EXTREMITIES- extremities normal, atraumatic, no cyanosis or edema  Recommendations  1. DM 2, newly diagnosed- better controlled. HbA1C 12 mg/dl. Increased lantus dose to 10 units. Needs diabetic diet . If discharged, would d/c on lantus(10 units sq qhs/aspart 5 units sq ac meal). 2. Htn- better controlled. Renal function stable on acei.  3. Morbid obesity- life style change encouraged.  4. Diverticular abscess- defer management to surgery.  5. AKI- stable, likely combination of factors, monitor. Would not pursue further renal studies at this point, unless worsening. If worsening, would be careful with ACEI/metformin, which patient needs long term in view of DM/obesity/htn. We will follow with you    Catherine Fowler 09/25/2011 11:49 AM Pager: [phone].

## 2011-09-25 NOTE — Progress Notes (Signed)
Inpatient Diabetes Program Recommendations  AACE/ADA: New Consensus Statement on Inpatient Glycemic Control (2009)  Target Ranges:  Prepandial:   less than 140 mg/dL      Peak postprandial:   less than 180 mg/dL (1-2 hours)      Critically ill patients:  140 - 180 mg/dL   Reason for Visit: hyperglycemia and newly diagnosed DM  Inpatient Diabetes Program Recommendations Outpatient Referral: OP diabetes education consult for newly diagnosed DM  Note: CBGs 145, 142, 194.  HgbA1C is 12. Will order Living Well With Diabetes book and insulin starter kit to begin teaching insulin admin.

## 2011-09-26 LAB — GLUCOSE, CAPILLARY
Glucose-Capillary: 182 mg/dL — ABNORMAL HIGH (ref 70–99)
Glucose-Capillary: 157 mg/dL — ABNORMAL HIGH (ref 70–99)

## 2011-09-26 LAB — BASIC METABOLIC PANEL
Calcium: 8.5 mg/dL (ref 8.4–10.5)
GFR calc non Af Amer: 50 mL/min — ABNORMAL LOW (ref >90)
Glucose, Bld: 162 mg/dL — ABNORMAL HIGH (ref 70–99)
Potassium: 3.9 mEq/L (ref 3.5–5.1)
Sodium: 138 mEq/L (ref 135–145)

## 2011-09-26 MED ORDER — METRONIDAZOLE 500 MG PO TABS
500.0000 mg | ORAL_TABLET | Freq: Three times a day (TID) | ORAL | Status: DC
  Administered 2011-09-27 – 2011-09-28 (×5): 500 mg via ORAL
  Filled 2011-09-26 (×8): qty 1

## 2011-09-26 MED ORDER — CIPROFLOXACIN HCL 500 MG PO TABS
500.0000 mg | ORAL_TABLET | Freq: Two times a day (BID) | ORAL | Status: DC
  Administered 2011-09-26 – 2011-09-28 (×4): 500 mg via ORAL
  Filled 2011-09-26 (×5): qty 1

## 2011-09-26 MED ORDER — CIPROFLOXACIN HCL 500 MG PO TABS: 500.0000 mg | ORAL_TABLET | Freq: Two times a day (BID) | ORAL | Status: DC

## 2011-09-26 NOTE — Progress Notes (Signed)
Subjective: Patient wants to go home. Her diet as needed once the surgery. She has remained afebrile overnight. Her CBGs have been in excellent control.  Objective: Vital signs in last 24 hours: Filed Vitals:   09/25/11 2130 09/26/11 0147 09/26/11 0612 09/26/11 1000  BP: 121/66 123/70 131/81 144/81  Pulse: 71 66 69 67  Temp: 98.1 F (36.7 C) 97.8 F (36.6 C) 98 F (36.7 C) 97.9 F (36.6 C)  TempSrc: Oral Oral Oral Oral  Resp: 20 20 20 20   Height:      Weight:      SpO2: 97%   97%    Intake/Output Summary (Last 24 hours) at 09/26/11 1401 Last data filed at 09/26/11 0900  Gross per 24 hour  Intake 2656.8 ml  Output   1615 ml  Net 1041.8 ml    Weight change:   General: Alert, awake, oriented x3, in no acute distress. HEENT: No bruits, no goiter. Heart: Regular rate and rhythm, without murmurs, rubs, gallops. Lungs: Clear to auscultation bilaterally. Abdomen: Soft, nontender, nondistended, positive bowel sounds. Extremities: No clubbing cyanosis or edema with positive pedal pulses. Neuro: Grossly intact, nonfocal.    Lab Results: Results for orders placed during the hospital encounter of 09/23/11 (from the past 24 hour(s))  GLUCOSE, CAPILLARY     Status: Abnormal   Collection Time   09/25/11  4:17 PM      Component Value Range   Glucose-Capillary 161 (*) 70 - 99 (mg/dL)   Comment 1 Notify RN     Comment 2 Documented in Chart    GLUCOSE, CAPILLARY     Status: Abnormal   Collection Time   09/25/11  9:33 PM      Component Value Range   Glucose-Capillary 157 (*) 70 - 99 (mg/dL)  BASIC METABOLIC PANEL     Status: Abnormal   Collection Time   09/26/11  4:45 AM      Component Value Range   Sodium 138  135 - 145 (mEq/L)   Potassium 3.9  3.5 - 5.1 (mEq/L)   Chloride 104  96 - 112 (mEq/L)   CO2 25  19 - 32 (mEq/L)   Glucose, Bld 162 (*) 70 - 99 (mg/dL)   BUN 6  6 - 23 (mg/dL)   Creatinine, Ser 1.61 (*) 0.50 - 1.10 (mg/dL)   Calcium 8.5  8.4 - 09.6 (mg/dL)   GFR  calc non Af Amer 50 (*) >90 (mL/min)   GFR calc Af Amer 58 (*) >90 (mL/min)  GLUCOSE, CAPILLARY     Status: Abnormal   Collection Time   09/26/11  7:28 AM      Component Value Range   Glucose-Capillary 182 (*) 70 - 99 (mg/dL)   Comment 1 Notify RN     Comment 2 Documented in Chart    GLUCOSE, CAPILLARY     Status: Abnormal   Collection Time   09/26/11 11:39 AM      Component Value Range   Glucose-Capillary 135 (*) 70 - 99 (mg/dL)   Comment 1 Notify RN     Comment 2 Documented in Chart       Micro: Recent Results (from the past 240 hour(s))  URINE CULTURE     Status: Normal   Collection Time   09/23/11 11:13 AM      Component Value Range Status Comment   Specimen Description URINE, CLEAN CATCH   Final    Special Requests NONE   Final    Setup Time [id number]  Final    Colony Count 40,000 COLONIES/ML   Final    Culture     Final    Value: Multiple bacterial morphotypes present, none predominant. Suggest appropriate recollection if clinically indicated.   Report Status 09/24/2011 FINAL   Final   ANAEROBIC CULTURE     Status: Normal (Preliminary result)   Collection Time   09/24/11 12:54 PM      Component Value Range Status Comment   Specimen Description ABSCESS ABDOMEN   Final    Special Requests NONE   Final    Gram Stain     Final    Value: ABUNDANT WBC PRESENT, PREDOMINANTLY MONONUCLEAR     NO SQUAMOUS EPITHELIAL CELLS SEEN     MODERATE GRAM POSITIVE COCCI     IN PAIRS IN CHAINS MODERATE GRAM POSITIVE RODS   Culture     Final    Value: NO ANAEROBES ISOLATED; CULTURE IN PROGRESS FOR 5 DAYS   Report Status PENDING   Incomplete   CULTURE, ROUTINE-ABSCESS     Status: Normal (Preliminary result)   Collection Time   09/24/11 12:54 PM      Component Value Range Status Comment   Specimen Description ABDOMEN ABSCESS   Final    Special Requests NONE   Final    Gram Stain     Final    Value: ABUNDANT WBC PRESENT, PREDOMINANTLY MONONUCLEAR     NO SQUAMOUS EPITHELIAL CELLS  SEEN     MODERATE GRAM POSITIVE COCCI     IN PAIRS IN CHAINS MODERATE GRAM POSITIVE RODS   Culture MODERATE GRAM NEGATIVE RODS   Final    Report Status PENDING   Incomplete     Studies/Results: No results found.  Medications:     . ciprofloxacin  400 mg Intravenous Q12H  . enoxaparin  40 mg Subcutaneous Q24H  . Flexpen Starter Kit  1 kit Other Once  . insulin aspart  0-9 Units Subcutaneous TID WC  . insulin glargine  8 Units Subcutaneous QHS  . lisinopril  10 mg Oral Daily  . living well with diabetes book   Does not apply Once  . metronidazole  500 mg Intravenous Q8H  . DISCONTD: living well with diabetes book   Does not apply Once     Assessment: Active Problems:  Intra-abdominal abscess  Nephrolithiasis  Hydronephrosis  ARF (acute renal failure)  DM (diabetes mellitus), type 2, uncontrolled  HTN (hypertension)  Obesity   Plan: . DM 2, newly diagnosed- better controlled. HbA1C 12 mg/dl. Increased lantus dose to 8 units. Needs diabetic diet . If discharged, would d/c on lantus(8 units sq qhs/aspart 3 units sq ac meal). Her CBGs have been in excellent control 2. Htn- better controlled. Renal function stable on acei.  3. Morbid obesity- life style change encouraged.  4. Diverticular abscess- defer management to surgery. Continue ciprofloxacin and Flagyl IV, continued on one side 5. AKI- stable, likely combination of factors, monitor. Patient was found to have a 2.1 cm UPJ stone with no function on delayed imaging. The right kidney appeared normal. She has been evaluated by urology. Since her creatinine is stable. She does not warrant any urgent intervention for her long-standing obstruction. She may need a URS/laser lithotripsy, left ESWL and left PCNL in the outpatient setting. If worsening, would be careful with ACEI/metformin, which patient needs long term in view of DM/obesity/htn.  At this time the patient appears to be medically stable from the standpoint of her  hypertension and diabetes,  would avoid ACE inhibitors/metformin/NSAIDs. We will sign off at this time, please contact us if further assistance is needed, it thank Dr. Daphine Deutscher for this consult.   LOS: 3 days   [facility] 09/26/2011, 2:01 PM

## 2011-09-26 NOTE — Progress Notes (Signed)
Subjective: Very upset, thought she was going home today.  Still on clear liquids.  I explained bowel rest, and IV abx, is primary RX. And that we usually repeat CT 5-7 days to monitor progress.  Alos upset about Renal stone and AODM.  She is normally not sick.  Objective: Vital signs in last 24 hours: Temp:  [97.8 F (36.6 C)-98.7 F (37.1 C)] 97.9 F (36.6 C) (11/14 1000) Pulse Rate:  [66-71] 67  (11/14 1000) Resp:  [20] 20  (11/14 1000) BP: (121-144)/(66-81) 144/81 mmHg (11/14 1000) SpO2:  [96 %-97 %] 97 % (11/14 1000) Last BM Date: 09/25/11  Intake/Output from previous day: 11/13 0701 - 11/14 0700 In: 3136.8 [P.O.:960; I.V.:1576.8; IV Piggyback:600] Out: 2065 [Urine:2050] Intake/Output this shift: Total I/O In: 120 [P.O.:120] Out: 300 [Urine:300]  General appearance: alert, cooperative and tearful, no distress GI: Soft, still tender, +BS, +BM, 15ml of drainage recorded, it is white-brownish and cloudy.  Lab Results:   [facility] 09/25/11 0440 09/24/11 0523  WBC 8.1 11.7*  HGB 9.4* 9.4*  HCT 30.9* 31.2*  PLT 400 434*    BMET  Basename 09/26/11 0445 09/25/11 0440  NA 138 137  K 3.9 3.7  CL 104 104  CO2 25 24  GLUCOSE 162* 163*  BUN 6 10  CREATININE 1.14* 1.14*  CALCIUM 8.5 8.5   PT/INR  Basename 09/23/11 1405  LABPROT 13.8  INR 1.04     Studies/Results: No results found.  Anti-infectives: Anti-infectives     Start     Dose/Rate Route Frequency Ordered Stop   09/23/11 1800   metroNIDAZOLE (FLAGYL) IVPB 500 mg        500 mg 100 mL/hr over 60 Minutes Intravenous Every 8 hours 09/23/11 1336     09/23/11 1700   ciprofloxacin (CIPRO) IVPB 400 mg        400 mg 200 mL/hr over 60 Minutes Intravenous Every 12 hours 09/23/11 1336     09/23/11 1300   piperacillin-tazobactam (ZOSYN) IVPB 3.375 g  Status:  Discontinued        3.375 g 12.5 mL/hr over 240 Minutes Intravenous  Once 09/23/11 1156 09/23/11 1336         Current Facility-Administered  Medications  Medication Dose Route Frequency Provider Last Rate Last Dose  . 0.9 %  sodium chloride infusion   Intravenous Continuous Rulon Abide, DO 100 mL/hr at 09/25/11 0135    . acetaminophen (TYLENOL) tablet 650 mg  650 mg Oral Q4H PRN Sherrie George, PA   650 mg at 09/24/11 1546  . ciprofloxacin (CIPRO) IVPB 400 mg  400 mg Intravenous Q12H Rulon Abide, DO   400 mg at 09/26/11 0515  . enoxaparin (LOVENOX) injection 40 mg  40 mg Subcutaneous Q24H Rulon Abide, DO   40 mg at 09/25/11 1645  . Flexpen Starter Kit 1 kit  1 kit Other Once Provider Default   1 kit at 09/25/11 1800  . HYDROcodone-acetaminophen (NORCO) 5-325 MG per tablet 1-2 tablet  1-2 tablet Oral Q4H PRN Rulon Abide, DO   2 tablet at 09/23/11 1737  . insulin aspart (novoLOG) injection 0-9 Units  0-9 Units Subcutaneous TID WC Simbiso Ranga   1 Units at 09/26/11 1246  . insulin glargine (LANTUS) injection 8 Units  8 Units Subcutaneous QHS Simbiso Ranga   8 Units at 09/25/11 2159  . lisinopril (PRINIVIL,ZESTRIL) tablet 10 mg  10 mg Oral Daily Simbiso Ranga   10 mg at 09/26/11 1017  . living  well with diabetes book MISC   Does not apply Once Provider Default      . metroNIDAZOLE (FLAGYL) IVPB 500 mg  500 mg Intravenous Q8H Rulon Abide, DO   500 mg at 09/26/11 1018  . midazolam (VERSED) 5 MG/5ML injection   Intravenous PRN Casimiro Needle T. Shick   2 mg at 09/24/11 1155  . morphine 2 MG/ML injection 2 mg  2 mg Intravenous Q1H PRN Rulon Abide, DO   2 mg at 09/23/11 1657  . ondansetron (ZOFRAN) injection 4 mg  4 mg Intravenous Q6H PRN Rulon Abide, DO      . DISCONTD: living well with diabetes book MISC   Does not apply Once Simbiso Ranga        Assessment/Plan   1.Abdominal abscess with Percutaneous drain day#3. 2. AODM 3. L UJV with hydronephrosis. Dr. Mena Goes (Urology) following. 4.Renal insuffiencey PLAN:  Continue abx, will ask Dr. Daphine Deutscher to discuss time line for abscess treatnebt,   Check labs tomorrow..  LOS: 3 days    JENNINGS,WILLARD 09/26/2011

## 2011-09-27 ENCOUNTER — Inpatient Hospital Stay (HOSPITAL_COMMUNITY): Payer: BC Managed Care – PPO

## 2011-09-27 LAB — CBC
HCT: 34 % — ABNORMAL LOW (ref 36.0–46.0)
Hemoglobin: 10.3 g/dL — ABNORMAL LOW (ref 12.0–15.0)
RBC: 4.36 MIL/uL (ref 3.87–5.11)
WBC: 6.8 10*3/uL (ref 4.0–10.5)

## 2011-09-27 LAB — GLUCOSE, CAPILLARY
Glucose-Capillary: 210 mg/dL — ABNORMAL HIGH (ref 70–99)
Glucose-Capillary: 122 mg/dL — ABNORMAL HIGH (ref 70–99)
Glucose-Capillary: 127 mg/dL — ABNORMAL HIGH (ref 70–99)

## 2011-09-27 LAB — CULTURE, ROUTINE-ABSCESS

## 2011-09-27 LAB — BASIC METABOLIC PANEL
BUN: 6 mg/dL (ref 6–23)
CO2: 24 mEq/L (ref 19–32)
Chloride: 104 mEq/L (ref 96–112)
GFR calc non Af Amer: 50 mL/min — ABNORMAL LOW (ref >90)
Glucose, Bld: 147 mg/dL — ABNORMAL HIGH (ref 70–99)
Potassium: 3.6 mEq/L (ref 3.5–5.1)
Sodium: 139 mEq/L (ref 135–145)

## 2011-09-27 MED ORDER — CIPROFLOXACIN HCL 500 MG PO TABS: 500.0000 mg | ORAL_TABLET | Freq: Two times a day (BID) | ORAL | Status: AC

## 2011-09-27 MED ORDER — HYDROCODONE-ACETAMINOPHEN 5-325 MG PO TABS: 1.0000 | ORAL_TABLET | ORAL | Status: AC | PRN

## 2011-09-27 MED ORDER — INSULIN GLARGINE 100 UNIT/ML ~~LOC~~ SOLN: 8.0000 [IU] | Freq: Every day | SUBCUTANEOUS | Status: DC

## 2011-09-27 MED ORDER — ACETAMINOPHEN 325 MG PO TABS: 650.0000 mg | ORAL_TABLET | ORAL | Status: AC | PRN

## 2011-09-27 MED ORDER — IOHEXOL 300 MG/ML  SOLN
100.0000 mL | Freq: Once | INTRAMUSCULAR | Status: AC | PRN
  Administered 2011-09-27: 100 mL via INTRAVENOUS

## 2011-09-27 MED ORDER — INSULIN ASPART 100 UNIT/ML ~~LOC~~ SOLN: 3.0000 [IU] | Freq: Three times a day (TID) | SUBCUTANEOUS | Status: DC

## 2011-09-27 MED ORDER — METRONIDAZOLE 500 MG PO TABS: 500.0000 mg | ORAL_TABLET | Freq: Three times a day (TID) | ORAL | Status: AC

## 2011-09-27 NOTE — Discharge Summary (Signed)
Physician Discharge Summary  Patient ID: Catherine Fowler MRN: [id number] DOB/AGE: [birth year] 60 y.o.  Admit date: 09/23/2011 Discharge date: 09/27/2011 ENDOCRINE: Dr. Lisabeth Devoid Urology Dr. Mena Goes Surgery Dr. Biagio Quint Admission Diagnoses: Intra-abdominal abscess of uncertain etiology . New diagnosis of adult onset diabetes2.  Discharge Diagnoses: Intra-abdominal abscess Ureteral pelvic junction stone with hydronephrosis. Atrophic left kidney Mild renal insufficiency Active Problems:  Intra-abdominal abscess  ARF (acute renal failure)  Nephrolithiasis  Hydronephrosis  DM (diabetes mellitus), type 2, uncontrolled  HTN (hypertension)  Obesity  Significant Diagnostic Studies: CT Abd 09/23/11 CT Guided anterior abdominal abscess drainage 09/24/11  Consults: Dr. Jerilee Field  Dr.Nayana Abrol  HPI: The patient is a 60 year old female who began having abdominal distention approximately 2 weeks ago. This became progressive she was seen in urgent care and diagnosis with constipation and diabetes. She was referred to Dr. Lisabeth Devoid of endocrinology and placed on the metformin. 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 Workup at the [facility] ER shows an intra-abdominal abscess personally 6 cm surrounded by inflammatory changes in the mesentery air bubbles but no evidence of free air. She has some reactive small bowel inflammatory changes as well as some along the transverse colon with some diverticuli. The origin of her abscess was unclear. She was transferred to [facility] for further of violation treatment by Dr. Lodema Pilot.  Hospital Course: The patient was admitted and scheduled for interventional radiology to place a percutaneous drain. This was done on 09/24/2011. Patient had been placed on IV antibiotics. CT scan also showed a  ureteropelvic junction stone with hydronephrosis and a probable atrophy of the left kidney. A urology consult was obtained patient was seen by Dr. Jerilee Field. He noted that this was now a chronic condition and there was no need for any acute intervention. It may require some intervention at a later date. She was also seen in consultation by medicine for her diabetes and mild renal insufficiency. She was taken off metformin and placed on insulin and it was recommended we avoided ACE inhibitors nonsteroidals also. She was placed on a clear liquid diet as been placed on bowel rest. She made fairly good progress, she is rather insistent on going home as soon as possible repeat CT was done 09/27/2011. The CT shows almost complete resolution of the abdominal abscess there was residual phlegmon and fat stranding surrounding the ventral abdominal  percutaneous drain. We attempted to discharge the patient on 09/27/2011 but home health arranged with and diabetic care could not be completed in the evening. We'll plan to discharge her tomorrow 09/28/2011 after all appointment and followup is completed.  Followup with Dr. Mena Goes in 2 weeks. Followup with Dr. Marliss Czar next week. Followup with Dr. Nicanor Alcon in 2 weeks.  percutaneous drain will be removed next Monday, 10/01/2011. She will go home on a 2 week course of Cipro and Flagyl she's been converted to Lantus and regular insulin. Doses are listed below. Condition on discharge: Improving This is will [place] PA for Dr. Luretha Murphy         Disposition:    Current Discharge Medication List    START taking these medications   Details  acetaminophen (TYLENOL) 325 MG tablet Take 2 tablets (650 mg total) by mouth every 4 (four) hours as needed. Qty: 30 tablet, Refills: 0    ciprofloxacin (CIPRO) 500 MG tablet Take  1 tablet (500 mg total) by mouth 2 (two) times daily. Qty: 30 tablet, Refills: 0    HYDROcodone-acetaminophen (NORCO) 5-325 MG per  tablet Take 1-2 tablets by mouth every 4 (four) hours as needed. Qty: 40 tablet, Refills: 0    insulin aspart (NOVOLOG) 100 UNIT/ML injection Inject 3 Units into the skin 3 (three) times daily with meals. Qty: 1 vial, Refills: 0    insulin glargine (LANTUS) 100 UNIT/ML injection Inject 8 Units into the skin at bedtime. Qty: 10 mL, Refills: 0    metroNIDAZOLE (FLAGYL) 500 MG tablet Take 1 tablet (500 mg total) by mouth every 8 (eight) hours. Qty: 42 tablet, Refills: 0      CONTINUE these medications which have NOT CHANGED   Details  OVER THE COUNTER MEDICATION Take 1 each by mouth daily. Gummy Fiber Vitamins.       STOP taking these medications     lisinopril (PRINIVIL,ZESTRIL) 5 MG tablet      metFORMIN (GLUCOPHAGE) 500 MG tablet      naproxen sodium (ANAPROX) 220 MG tablet        Follow-up Information    Follow up with BALAN,BINDUBAL. Call in 3 days.   Contact information:   [address], [address] [place] [address] [phone]       Follow up with Lodema Pilot DAVID, DO. Make an appointment in 1 week. (Call if you have a problem sooner if symptoms worsen)    Contact information:   [address] [place] [address] [phone]       Follow up with Antony Haste, MD. Make an appointment in 2 weeks.   Contact information:   [address] [facility] [address] [facility] [place] [address] [phone]          Signed: Sherrie George 09/27/2011, 6:20 PM

## 2011-09-27 NOTE — Progress Notes (Signed)
   *   No surgery found *  Subjective: Feeling better.  Awaiting CT scan and wanting to go home later today  Objective: Vital signs in last 24 hours: Temp:  [97.9 F (36.6 C)-98.1 F (36.7 C)] 97.9 F (36.6 C) (11/15 0500) Pulse Rate:  [67-75] 67  (11/15 0500) Resp:  [20] 20  (11/15 0500) BP: (126-144)/(78-83) 135/81 mmHg (11/15 0500) SpO2:  [95 %-97 %] 97 % (11/15 0500)   Intake/Output from previous day: 11/14 0701 - 11/15 0700 In: 1760 [P.O.:960; I.V.:800] Out: 1460 [Urine:1450; Drains:10] Intake/Output this shift:    Drain in place  Lab Results:   Basename 09/27/11 0406 09/25/11 0440  WBC 6.8 8.1  HGB 10.3* 9.4*  HCT 34.0* 30.9*  PLT 428* 400   BMET  Basename 09/27/11 0406 09/26/11 0445  NA 139 138  K 3.6 3.9  CL 104 104  CO2 24 25  GLUCOSE 147* 162*  BUN 6 6  CREATININE 1.14* 1.14*  CALCIUM 8.7 8.5   PT/INR No results found for this basename: LABPROT:2,INR:2 in the last 72 hours  Studies/Results: No results found.  Anti-infectives: Anti-infectives     Start     Dose/Rate Route Frequency Ordered Stop   09/27/11 0800   ciprofloxacin (CIPRO) tablet 500 mg  Status:  Discontinued        500 mg Oral 2 times daily 09/26/11 1745 09/26/11 2008   09/26/11 2300   metroNIDAZOLE (FLAGYL) tablet 500 mg        500 mg Oral 3 times per day 09/26/11 2255     09/26/11 2100   ciprofloxacin (CIPRO) tablet 500 mg        500 mg Oral 2 times daily 09/26/11 2008     09/23/11 1800   metroNIDAZOLE (FLAGYL) IVPB 500 mg  Status:  Discontinued        500 mg 100 mL/hr over 60 Minutes Intravenous Every 8 hours 09/23/11 1336 09/26/11 2300   09/23/11 1700   ciprofloxacin (CIPRO) IVPB 400 mg  Status:  Discontinued        400 mg 200 mL/hr over 60 Minutes Intravenous Every 12 hours 09/23/11 1336 09/26/11 1745   09/23/11 1300   piperacillin-tazobactam (ZOSYN) IVPB 3.375 g  Status:  Discontinued        3.375 g 12.5 mL/hr over 240 Minutes Intravenous  Once 09/23/11 1156  09/23/11 1336          Assessment/Plan: CT scan.  Probable discharge with drain in place later today * No surgery found *    LOS: 4 days    Matt B. Daphine Deutscher, MD, [facility] [phone] beeper [phone]  09/27/2011 7:52 AM

## 2011-09-27 NOTE — Progress Notes (Signed)
Paged Diabetic Educator and Case Management to assist with discharge of patient.  Awaiting response.  Pt will need diabetic teaching as well as supplies.  Pt will also need JP teaching provided by the RN prior to discharge.

## 2011-09-27 NOTE — Progress Notes (Signed)
1605-diabetic Educator returned pages and instructed on patients needs at discharge.  Patient has been seen by diabetic coordinator and supplied with Living Will Book and Insulin starter kit.  Recommends Outpatient Diabetic  Consult for follow-up with A1C 12 during admission.  1610-Spoke with patient concerning education on diabetics prior to admission.  Pt states, feeling very comfortable with checking blood sugar and using home machine (which is at bedside for proper demonstration prior to discharge home.  Pt willing to learn proper insulin injection and demonstration.  Pt verbalizes proper technique for insulin pen use.  Will have patient perform a blood sugar check and insulin injection prior to discharge home.  Pt has been highlighting Living Will book, and verbalizes proper teachings from book.  1621-Spoke with Terri- Case management concerning any needs from them prior to patients discharge home.  Instructed to obtain orders for diabetic supplied from Md for insurance to cover supplied.  Pt has starter kit at bedside and home glucose meter.  Jeananne Rama, PA  From radiology in to evaluate patient and drain.  From radiology standpoint, he states, "she is fine to go."  Stated, he will order flushes for drain at home.  Nurse to instruct drain care and flushing.  Pt set up to watch video on Diabetes: insulin injection and Diabetes: Reducing Fears about injections.  1645- Notified Will [place], [place] concerning the above information, and the patients desire to discharge home.  RN Luanna Cole in to teach insulin injections, and proper demonstration of insulin injections and proper technique to obtain blood sugar.  Will Marlyne Beards, PA to come up and evaluate pt for discharge and discharge instructions.

## 2011-09-27 NOTE — Progress Notes (Signed)
Subjective: Patient feeling better; awaiting d/c home.  Objective: Vital signs in last 24 hours: Temp:  [97.9 F (36.6 C)-98.1 F (36.7 C)] 98 F (36.7 C) (11/15 1430) Pulse Rate:  [67-75] 73  (11/15 1430) Resp:  [18-20] 18  (11/15 1430) BP: (126-137)/(79-83) 127/79 mmHg (11/15 1430) SpO2:  [95 %-97 %] 97 % (11/15 1430) Last BM Date: 09/27/11  Intake/Output from previous day: 11/14 0701 - 11/15 0700 In: 1760 [P.O.:960; I.V.:800] Out: 1465 [Urine:1450; Drains:15] Intake/Output this shift: Total I/O In: 360 [P.O.:360] Out: 1050 [Urine:1050]  Pelvic drain intact, insertion site ok, mildly tender; about 20 cc's cream colored fluid in j-p bulb  Lab Results:   [facility] 09/27/11 0406 09/25/11 0440  WBC 6.8 8.1  HGB 10.3* 9.4*  HCT 34.0* 30.9*  PLT 428* 400   BMET  Basename 09/27/11 0406 09/26/11 0445  NA 139 138  K 3.6 3.9  CL 104 104  CO2 24 25  GLUCOSE 147* 162*  BUN 6 6  CREATININE 1.14* 1.14*  CALCIUM 8.7 8.5   PT/INR No results found for this basename: LABPROT:2,INR:2 in the last 72 hours ABG No results found for this basename: PHART:2,PCO2:2,PO2:2,HCO3:2 in the last 72 hours  Studies/Results: Ct Abdomen Pelvis W Wo Contrast  09/27/2011  *RADIOLOGY REPORT*  Clinical Data: Follow up abscess drainage  CT ABDOMEN AND PELVIS WITHOUT AND WITH CONTRAST  Technique:  Multidetector CT imaging of the abdomen and pelvis was performed without contrast material in one or both body regions, followed by contrast material(s) and further sections in one or both body regions.  Contrast: OMNIPAQUE IOHEXOL 300 MG/ML IV SOLN  Comparison: 09/24/2011  Findings: The lung bases are clear.  No pericardial or pleural effusion identified.  The liver parenchyma is normal.  The spleen appears normal.  Both adrenal glands are normal.  There is a tiny stone within the upper pole of the right kidney measuring 3.2 mm, image 38.  Chronic left renal atrophy and hydronephrosis is noted.  At  the left UPJ there is a large stone measuring 1.7 cm.  There is no upper abdominal adenopathy.  No pelvic or inguinal adenopathy identified.  Urinary bladder is normal.  The uterus and adnexal structures are unremarkable.  Postoperative changes compatible with gastric bypass surgery.  The small bowel loops are nonobstructed.  Enteric contrast material is identified within the ileum and colon.  There is a percutaneous pigtail drainage catheter within the ventral abdomen.  There has is been near complete resolution of the lower abdominal abscess.  Residual fat stranding and phlegmon is identified surrounding the percutaneous drainage catheter.  IMPRESSION:  1.  Near complete resolution of abdominal abscess status post placement of percutaneous drainage catheter.  Residual phlegmon and fat stranding surrounds the ventral abdominal percutaneous drainage catheter. 2.  Stable left renal atrophy and large left UPJ calculus.  Original Report Authenticated By: Rosealee Albee, M.D.    Anti-infectives: Anti-infectives     Start     Dose/Rate Route Frequency Ordered Stop   09/27/11 0800   ciprofloxacin (CIPRO) tablet 500 mg  Status:  Discontinued        500 mg Oral 2 times daily 09/26/11 1745 09/26/11 2008   09/26/11 2300   metroNIDAZOLE (FLAGYL) tablet 500 mg        500 mg Oral 3 times per day 09/26/11 2255     09/26/11 2100   ciprofloxacin (CIPRO) tablet 500 mg        500 mg Oral 2 times  daily 09/26/11 2008     09/23/11 1800   metroNIDAZOLE (FLAGYL) IVPB 500 mg  Status:  Discontinued        500 mg 100 mL/hr over 60 Minutes Intravenous Every 8 hours 09/23/11 1336 09/26/11 2300   09/23/11 1700   ciprofloxacin (CIPRO) IVPB 400 mg  Status:  Discontinued        400 mg 200 mL/hr over 60 Minutes Intravenous Every 12 hours 09/23/11 1336 09/26/11 1745   09/23/11 1300   piperacillin-tazobactam (ZOSYN) IVPB 3.375 g  Status:  Discontinued        3.375 g 12.5 mL/hr over 240 Minutes Intravenous  Once 09/23/11  1156 09/23/11 1336          Assessment/Plan: s/p pelvic abscess drainage 09/24/11 with near complete resolution of abscess on recent CT; plan is to d/c pt home with drain today per [facility] with f/u in their office next week; flush drain once daily, record output and change dressing every one to two days.   LOS: 4 days    Lamara Brecht,D [facility] 09/27/2011

## 2011-09-27 NOTE — Progress Notes (Signed)
Inpatient Diabetes Program Recommendations  AACE/ADA: New Consensus Statement on Inpatient Glycemic Control (2009)  Target Ranges:  Prepandial:   less than 140 mg/dL      Peak postprandial:   less than 180 mg/dL (1-2 hours)      Critically ill patients:  140 - 180 mg/dL   Reason :RN paged inpatient diabetes program for education  Inpatient Diabetes Program Recommendations Outpatient Referral: OP diabetes education consult for newly diagnosed DM  Note: Bedside RN instructs patients on how to check CBGs and administer insulin. Diabetes Coordinator ordered insulin starter kit for nurse on 09-25-11 and the 'Living Well with Diabetes' patient education manual.

## 2011-09-28 ENCOUNTER — Other Ambulatory Visit: Payer: Self-pay | Admitting: Radiology

## 2011-09-28 DIAGNOSIS — IMO0002 Reserved for concepts with insufficient information to code with codable children: Secondary | ICD-10-CM

## 2011-09-28 LAB — GLUCOSE, CAPILLARY: Glucose-Capillary: 248 mg/dL — ABNORMAL HIGH (ref 70–99)

## 2011-09-28 NOTE — Progress Notes (Signed)
Subjective: Ready to go home. She has done her drain care, her glucose, and given herself insulin.  Objective: Vital signs in last 24 hours: Temp:  [98 F (36.7 C)-98.2 F (36.8 C)] 98 F (36.7 C) (11/16 0510) Pulse Rate:  [64-73] 64  (11/16 0510) Resp:  [18] 18  (11/16 0510) BP: (122-137)/(73-79) 125/73 mmHg (11/16 0510) SpO2:  [97 %] 97 % (11/16 0510) Last BM Date: 09/27/11  Intake/Output from previous day: 11/15 0701 - 11/16 0700 In: 360 [P.O.:360] Out: 1970 [Urine:1950; Drains:20] Intake/Output this shift: Total I/O In: 240 [P.O.:240] Out: -   General appearance: alert, cooperative and no distress GI: soft, non-tender; bowel sounds normal; no masses,  no organomegaly and Drain about the same  Lab Results:   [facility] 09/27/11 0406  WBC 6.8  HGB 10.3*  HCT 34.0*  PLT 428*    BMET  Basename 09/27/11 0406 09/26/11 0445  NA 139 138  K 3.6 3.9  CL 104 104  CO2 24 25  GLUCOSE 147* 162*  BUN 6 6  CREATININE 1.14* 1.14*  CALCIUM 8.7 8.5   PT/INR No results found for this basename: LABPROT:2,INR:2 in the last 72 hours   Studies/Results: Ct Abdomen Pelvis W Wo Contrast  09/27/2011  *RADIOLOGY REPORT*  Clinical Data: Follow up abscess drainage  CT ABDOMEN AND PELVIS WITHOUT AND WITH CONTRAST  Technique:  Multidetector CT imaging of the abdomen and pelvis was performed without contrast material in one or both body regions, followed by contrast material(s) and further sections in one or both body regions.  Contrast: OMNIPAQUE IOHEXOL 300 MG/ML IV SOLN  Comparison: 09/24/2011  Findings: The lung bases are clear.  No pericardial or pleural effusion identified.  The liver parenchyma is normal.  The spleen appears normal.  Both adrenal glands are normal.  There is a tiny stone within the upper pole of the right kidney measuring 3.2 mm, image 38.  Chronic left renal atrophy and hydronephrosis is noted.  At the left UPJ there is a large stone measuring 1.7 cm.  There  is no upper abdominal adenopathy.  No pelvic or inguinal adenopathy identified.  Urinary bladder is normal.  The uterus and adnexal structures are unremarkable.  Postoperative changes compatible with gastric bypass surgery.  The small bowel loops are nonobstructed.  Enteric contrast material is identified within the ileum and colon.  There is a percutaneous pigtail drainage catheter within the ventral abdomen.  There has is been near complete resolution of the lower abdominal abscess.  Residual fat stranding and phlegmon is identified surrounding the percutaneous drainage catheter.  IMPRESSION:  1.  Near complete resolution of abdominal abscess status post placement of percutaneous drainage catheter.  Residual phlegmon and fat stranding surrounds the ventral abdominal percutaneous drainage catheter. 2.  Stable left renal atrophy and large left UPJ calculus.  Original Report Authenticated By: Rosealee Albee, M.D.    Anti-infectives: Anti-infectives     Start     Dose/Rate Route Frequency Ordered Stop   09/27/11 0800   ciprofloxacin (CIPRO) tablet 500 mg  Status:  Discontinued        500 mg Oral 2 times daily 09/26/11 1745 09/26/11 2008   09/27/11 0000   ciprofloxacin (CIPRO) 500 MG tablet        500 mg Oral 2 times daily 09/27/11 1739 10/07/11 2359   09/27/11 0000   metroNIDAZOLE (FLAGYL) 500 MG tablet        500 mg Oral Every 8 hours 09/27/11 1739 10/07/11  2359   09/26/11 2300   metroNIDAZOLE (FLAGYL) tablet 500 mg        500 mg Oral 3 times per day 09/26/11 2255     09/26/11 2100   ciprofloxacin (CIPRO) tablet 500 mg        500 mg Oral 2 times daily 09/26/11 2008     09/23/11 1800   metroNIDAZOLE (FLAGYL) IVPB 500 mg  Status:  Discontinued        500 mg 100 mL/hr over 60 Minutes Intravenous Every 8 hours 09/23/11 1336 09/26/11 2300   09/23/11 1700   ciprofloxacin (CIPRO) IVPB 400 mg  Status:  Discontinued        400 mg 200 mL/hr over 60 Minutes Intravenous Every 12 hours 09/23/11 1336  09/26/11 1745   09/23/11 1300   piperacillin-tazobactam (ZOSYN) IVPB 3.375 g  Status:  Discontinued        3.375 g 12.5 mL/hr over 240 Minutes Intravenous  Once 09/23/11 1156 09/23/11 1336         Current Facility-Administered Medications  Medication Dose Route Frequency Provider Last Rate Last Dose  . 0.9 %  sodium chloride infusion   Intravenous Continuous Rulon Abide, DO 100 mL/hr at 09/25/11 0135    . acetaminophen (TYLENOL) tablet 650 mg  650 mg Oral Q4H PRN Sherrie George, PA   650 mg at 09/24/11 1546  . ciprofloxacin (CIPRO) tablet 500 mg  500 mg Oral BID Clance Boll, PHARMD   500 mg at 09/28/11 0818  . enoxaparin (LOVENOX) injection 40 mg  40 mg Subcutaneous Q24H Rulon Abide, DO   40 mg at 09/27/11 1732  . HYDROcodone-acetaminophen (NORCO) 5-325 MG per tablet 1-2 tablet  1-2 tablet Oral Q4H PRN Rulon Abide, DO   2 tablet at 09/23/11 1737  . insulin aspart (novoLOG) injection 0-9 Units  0-9 Units Subcutaneous TID WC Simbiso Ranga   3 Units at 09/28/11 0819  . insulin glargine (LANTUS) injection 8 Units  8 Units Subcutaneous QHS Simbiso Ranga   8 Units at 09/27/11 2123  . iohexol (OMNIPAQUE) 300 MG/ML injection 100 mL  100 mL Intravenous Once PRN Medication Radiologist   100 mL at 09/27/11 1230  . lisinopril (PRINIVIL,ZESTRIL) tablet 10 mg  10 mg Oral Daily Simbiso Ranga   10 mg at 09/27/11 1302  . metroNIDAZOLE (FLAGYL) tablet 500 mg  500 mg Oral Q8H Ernestene Mention, MD   500 mg at 09/28/11 0454  . midazolam (VERSED) 5 MG/5ML injection   Intravenous PRN Michael T. Shick   2 mg at 09/24/11 1155  . morphine 2 MG/ML injection 2 mg  2 mg Intravenous Q1H PRN Rulon Abide, DO   2 mg at 09/23/11 1657  . ondansetron (ZOFRAN) injection 4 mg  4 mg Intravenous Q6H PRN Rulon Abide, DO        Assessment/Plan Patient Active Problem List  Diagnoses  . Intra-abdominal abscess  . Nephrolithiasis  . Hydronephrosis  . ARF (acute renal failure)  . DM  (diabetes mellitus), type 2, uncontrolled  . HTN (hypertension)  . Obesity   Plan:on DC today     LOS: 5 days    Catherine Fowler 09/28/2011

## 2011-09-28 NOTE — Progress Notes (Signed)
Pt discharge instructions review and pt verbalized understanding.  Pt has successfully completed return demonstration of how to flush her JP drain.  Pt understands follow up appt. With radiology on 10/01/2011 at 1100.  Pt is to be discharged home.  No concerns at this time.

## 2011-09-28 NOTE — Progress Notes (Signed)
Subjective: Patient doing well; going home today with pelvic abscess drain.  Objective: Vital signs in last 24 hours: Temp:  [98 F (36.7 C)-98.2 F (36.8 C)] 98 F (36.7 C) (11/16 0510) Pulse Rate:  [64-73] 64  (11/16 0510) Resp:  [18] 18  (11/16 0510) BP: (122-137)/(73-79) 125/73 mmHg (11/16 0510) SpO2:  [97 %] 97 % (11/16 0510) Last BM Date: 09/27/11  Intake/Output from previous day: 11/15 0701 - 11/16 0700 In: 360 [P.O.:360] Out: 1970 [Urine:1950; Drains:20] Intake/Output this shift: Total I/O In: 240 [P.O.:240] Out: -   Pelvic drain intact; insertion site ok, minimal output  Lab Results:   [facility] 09/27/11 0406  WBC 6.8  HGB 10.3*  HCT 34.0*  PLT 428*   BMET  Basename 09/27/11 0406 09/26/11 0445  NA 139 138  K 3.6 3.9  CL 104 104  CO2 24 25  GLUCOSE 147* 162*  BUN 6 6  CREATININE 1.14* 1.14*  CALCIUM 8.7 8.5   PT/INR No results found for this basename: LABPROT:2,INR:2 in the last 72 hours ABG No results found for this basename: PHART:2,PCO2:2,PO2:2,HCO3:2 in the last 72 hours  Studies/Results: Ct Abdomen Pelvis W Wo Contrast  09/27/2011  *RADIOLOGY REPORT*  Clinical Data: Follow up abscess drainage  CT ABDOMEN AND PELVIS WITHOUT AND WITH CONTRAST  Technique:  Multidetector CT imaging of the abdomen and pelvis was performed without contrast material in one or both body regions, followed by contrast material(s) and further sections in one or both body regions.  Contrast: OMNIPAQUE IOHEXOL 300 MG/ML IV SOLN  Comparison: 09/24/2011  Findings: The lung bases are clear.  No pericardial or pleural effusion identified.  The liver parenchyma is normal.  The spleen appears normal.  Both adrenal glands are normal.  There is a tiny stone within the upper pole of the right kidney measuring 3.2 mm, image 38.  Chronic left renal atrophy and hydronephrosis is noted.  At the left UPJ there is a large stone measuring 1.7 cm.  There is no upper abdominal adenopathy.   No pelvic or inguinal adenopathy identified.  Urinary bladder is normal.  The uterus and adnexal structures are unremarkable.  Postoperative changes compatible with gastric bypass surgery.  The small bowel loops are nonobstructed.  Enteric contrast material is identified within the ileum and colon.  There is a percutaneous pigtail drainage catheter within the ventral abdomen.  There has is been near complete resolution of the lower abdominal abscess.  Residual fat stranding and phlegmon is identified surrounding the percutaneous drainage catheter.  IMPRESSION:  1.  Near complete resolution of abdominal abscess status post placement of percutaneous drainage catheter.  Residual phlegmon and fat stranding surrounds the ventral abdominal percutaneous drainage catheter. 2.  Stable left renal atrophy and large left UPJ calculus.  Original Report Authenticated By: Rosealee Albee, M.D.    Anti-infectives: Anti-infectives     Start     Dose/Rate Route Frequency Ordered Stop   09/27/11 0800   ciprofloxacin (CIPRO) tablet 500 mg  Status:  Discontinued        500 mg Oral 2 times daily 09/26/11 1745 09/26/11 2008   09/27/11 0000   ciprofloxacin (CIPRO) 500 MG tablet        500 mg Oral 2 times daily 09/27/11 1739 10/07/11 2359   09/27/11 0000   metroNIDAZOLE (FLAGYL) 500 MG tablet        500 mg Oral Every 8 hours 09/27/11 1739 10/07/11 2359   09/26/11 2300   metroNIDAZOLE (FLAGYL) tablet  500 mg        500 mg Oral 3 times per day 09/26/11 2255     09/26/11 2100   ciprofloxacin (CIPRO) tablet 500 mg        500 mg Oral 2 times daily 09/26/11 2008     09/23/11 1800   metroNIDAZOLE (FLAGYL) IVPB 500 mg  Status:  Discontinued        500 mg 100 mL/hr over 60 Minutes Intravenous Every 8 hours 09/23/11 1336 09/26/11 2300   09/23/11 1700   ciprofloxacin (CIPRO) IVPB 400 mg  Status:  Discontinued        400 mg 200 mL/hr over 60 Minutes Intravenous Every 12 hours 09/23/11 1336 09/26/11 1745   09/23/11 1300    piperacillin-tazobactam (ZOSYN) IVPB 3.375 g  Status:  Discontinued        3.375 g 12.5 mL/hr over 240 Minutes Intravenous  Once 09/23/11 1156 09/23/11 1336          Assessment/Plan: s/p pelvic abscess drainage 09/24/11; plan is for patient to return to [facility] radiology dept. 10/01/11 at 11:00 for drain injection with possible removal if no fistula noted.   LOS: 5 days    Amberle Lyter,D [facility] 09/28/2011

## 2011-09-29 LAB — ANAEROBIC CULTURE

## 2011-10-01 ENCOUNTER — Ambulatory Visit (HOSPITAL_COMMUNITY)
Admission: RE | Admit: 2011-10-01 | Discharge: 2011-10-01 | Disposition: A | Discharge status: Home / Self Care | Payer: BC Managed Care – PPO | Source: Ambulatory Visit | Attending: Radiology | Admitting: Radiology

## 2011-10-01 DIAGNOSIS — IMO0002 Reserved for concepts with insufficient information to code with codable children: Secondary | ICD-10-CM

## 2011-10-01 DIAGNOSIS — T85898A Other specified complication of other internal prosthetic devices, implants and grafts, initial encounter: Secondary | ICD-10-CM | POA: Insufficient documentation

## 2011-10-01 DIAGNOSIS — N731 Chronic parametritis and pelvic cellulitis: Secondary | ICD-10-CM | POA: Insufficient documentation

## 2011-10-01 DIAGNOSIS — Y849 Medical procedure, unspecified as the cause of abnormal reaction of the patient, or of later complication, without mention of misadventure at the time of the procedure: Secondary | ICD-10-CM | POA: Insufficient documentation

## 2011-10-01 MED ORDER — IOHEXOL 300 MG/ML  SOLN
35.0000 mL | Freq: Once | INTRAMUSCULAR | Status: AC | PRN
  Administered 2011-10-01: 35 mL

## 2011-10-08 ENCOUNTER — Telehealth (INDEPENDENT_AMBULATORY_CARE_PROVIDER_SITE_OTHER): Payer: Self-pay | Admitting: General Surgery

## 2011-10-08 NOTE — Telephone Encounter (Signed)
Pt needs a po appt for this week, please call.

## 2011-10-12 ENCOUNTER — Ambulatory Visit (INDEPENDENT_AMBULATORY_CARE_PROVIDER_SITE_OTHER): Payer: BC Managed Care – PPO | Admitting: General Surgery

## 2011-10-12 ENCOUNTER — Encounter (INDEPENDENT_AMBULATORY_CARE_PROVIDER_SITE_OTHER): Payer: Self-pay | Admitting: General Surgery

## 2011-10-12 VITALS — BP 134/72 | HR 60 | Temp 97.1°F | Resp 18 | Ht 66.5 in | Wt 207.5 lb

## 2011-10-12 DIAGNOSIS — T8140XA Infection following a procedure, unspecified, initial encounter: Secondary | ICD-10-CM

## 2011-10-12 DIAGNOSIS — K651 Peritoneal abscess: Secondary | ICD-10-CM

## 2011-10-12 NOTE — Progress Notes (Signed)
Subjective:     Patient ID: Catherine Fowler, female   DOB: 1950-12-30, 60 y.o.   MRN: [id number]  HPI This patient follows up a recent admission for intra-abdominal abscess of uncertain etiology. She had a percutaneous drain and this has been removed with followup CT scan which revealed resolution of the fluid collection. She remains on Cipro and Flagyl but she denies any discomfort, fevers, or chills. She is returned to the regular activity. Her only concern isa "knot" in the area of the percutaneous drain.Review of Systems     Objective:   Physical Exam No distress and nontoxic-appearing  Her abdomen is soft and nontender exam the drain site has a small 1 cm nodule in the area which is induration from the presence of the tube. There is no cellulitis or fluctuance or sign of infection    Assessment:     Doing well status post drainage of intra-abdominal abscess. I think that this is most likely a diverticular abscess of the transverse colon or potentially a small bowel diverticulum. She is clinically resolved and has no complaints.    Plan:     I recommended that she completed her course of antibiotics and she can return to regular activity as soon as possible the induration around the drain site should resolve and I recommend that she follow up with me in 2 months if this is still present. Since we are not certain where this originated from, I would not recommend any particular surgery at this time.though she expressed understanding that we cannot be certain that this will not recur, again, I would not recommend any surgery at this time.

## 2011-11-15 ENCOUNTER — Encounter: Payer: BC Managed Care – PPO | Attending: Endocrinology | Admitting: *Deleted

## 2011-11-15 DIAGNOSIS — E669 Obesity, unspecified: Secondary | ICD-10-CM

## 2011-11-15 DIAGNOSIS — Z713 Dietary counseling and surveillance: Secondary | ICD-10-CM | POA: Insufficient documentation

## 2011-11-15 DIAGNOSIS — E119 Type 2 diabetes mellitus without complications: Secondary | ICD-10-CM | POA: Insufficient documentation

## 2011-11-15 DIAGNOSIS — E1165 Type 2 diabetes mellitus with hyperglycemia: Secondary | ICD-10-CM

## 2011-11-15 NOTE — Progress Notes (Signed)
  Medical Nutrition Therapy:  Appt start time: 0800 end time:  0900.   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 Six years ago she had it "undone" and has felt much better since then. She has had weight issues most of her life with history of Weight Watchers. She states she walks on treadmill daily and enjoys that.  MEDICATIONS: see list. Diabetes medications were Lantus and Novolog but she states the Novolog has been discontinued, the Lantus increased to current dose of 26 units at night.   DIETARY INTAKE:  Usual eating pattern includes 3 meals and 1-3 snacks per day.  Everyday foods include good variety of all food groups.  Avoided foods include coffee or soda.    24-hr recall:  B ( AM): glass of juice and handful of crackers, then egg & toast OR vegetable omelet OR left overs Snk ( AM): occasionally frozen yogurt or Bugles or fresh fruit   L ( PM): eat out often, pita with lean meat, Lean Cuisine meal, low fat PNB and sugar free jelly in pita, white castle burger x 2 Snk ( PM): occasionally crax, fruit, cups of fruit, or cup of green beans D ( PM): eat out often; salad first then filet or london broil with occasional potato with cottage cheese and veggies  OR home- tv dinner, etc Snk ( PM): rare cup a fruit, grapefruit sections Beverages: tea, water, juice   Usual physical activity: typically on treadmill every AM for 30-50 minutes plus lifting weights  Estimated energy needs: 1400 calories 158 g carbohydrates 105 g protein 39 g fat  Progress Towards Goal(s):  In progress.   Nutritional Diagnosis:  NI-1.5 Excessive energy intake As related to diabetes management.  As evidenced by A1c of 12%.    Intervention:  Nutrition counseling and diabetes education provided. Goals:  Follow Diabetes Meal Plan as instructed  Eat 3 meals and snacks if hungry  Limit carbohydrate intake to 30 grams  carbohydrate/meal  Limit carbohydrate intake to 15 grams carbohydrate/snack  Add lean protein foods to meals/snacks  Monitor glucose levels as instructed by your doctor  Aim for 15-30 mins of physical activity daily  Bring food record and glucose log to your next nutrition visit  Handouts given during visit include:  Living Well with Diabetes  Carb Counting and Label Reading handouts  Monitoring/Evaluation:  Dietary intake, exercise, label reading, and body weight in 4 week(s).

## 2011-11-15 NOTE — Patient Instructions (Addendum)
Goals:  Follow Diabetes Meal Plan as instructed  Eat 3 meals and snacks if hungry  Limit carbohydrate intake to 30 grams carbohydrate/meal  Limit carbohydrate intake to 15 grams carbohydrate/snack  Add lean protein foods to meals/snacks  Monitor glucose levels as instructed by your doctor  Aim for 15-30 mins of physical activity daily  Bring food record and glucose log to your next nutrition visit 

## 2011-11-19 ENCOUNTER — Encounter: Payer: Self-pay | Admitting: *Deleted

## 2011-12-06 ENCOUNTER — Encounter: Payer: BC Managed Care – PPO | Admitting: *Deleted

## 2011-12-06 ENCOUNTER — Encounter: Payer: Self-pay | Admitting: *Deleted

## 2011-12-06 DIAGNOSIS — E669 Obesity, unspecified: Secondary | ICD-10-CM

## 2011-12-06 DIAGNOSIS — E1165 Type 2 diabetes mellitus with hyperglycemia: Secondary | ICD-10-CM

## 2011-12-06 NOTE — Progress Notes (Signed)
  Medical Nutrition Therapy:  Appt start time: 0800 end time:  0900.   Assessment:  Primary concerns today: Patient returns for follow up visit for weight loss and diabetes management. She expresses frustration with lack of weight loss even though she feels she has increased her activity level and significantly reduced her food intake. She has omitted her Lantus recently at her MD's direction and noticed only a small increase in her FBG numbers, about 20 mg/dl on average. She expresses not being comfortable with Carb counting yet. Her daughter is very supportive with her in many ways including participating in ways to increase her activity level.   MEDICATIONS: see list. Diabetes medications were Lantus was DC'd briefly starting Jan 14th and again now. So currently not on any insulin until next Monday, Jan 28th.   DIETARY INTAKE:  Patient states she has reduced the meat portion sizes and the high carb foods, though she has not been keeping any records lately.  Usual physical activity: typically on treadmill every AM for 40-50 minutes and has increased the elevation plus lifting weights and doing other additional exercises throughout the day  Estimated energy needs: 1400 calories 158 g carbohydrates 105 g protein 39 g fat  Progress Towards Goal(s):  In progress.   Nutritional Diagnosis:  NI-1.5 Excessive energy intake As related to diabetes management.  As evidenced by A1c of 12%.    Intervention:  Nutrition counseling provided including the concept of calories being the measurement of the energy content of food and how many calories must be reduced to allow for weight loss. Diabetes education provided on insulin action time of Lantus vs Novolog. Encouraged her to check post meal to evaluate BGs at various times of day. Also encouraged her to call her insurance company to check on reimbursement options for test strips, insulin via pen vs syringe and brand Novolog vs Humalog as she is  concerned about cost of all of the above. Plan: Start keeping a food diary to increase more awareness of food intake and types of foods eaten Add post meal BG check to assess BG changes at various times of day Check with insurance company on payment for different brands of strips  And possible financial savings on Humalog vs. Novolog, as well as pens vs. Vials of insulin Note that it takes a deficit of 3500 calories per week to lose 1 pound (500 cal / day average)  Handouts given during visit include:  Menu Planner sheet to keep food diary  Log Sheet to record pre and post meal BG data  Monitoring/Evaluation:  Dietary intake, exercise, menu planner, and body weight in 4 week(s).

## 2011-12-06 NOTE — Patient Instructions (Signed)
Plan: Start keeping a food diary to increase more awareness of food intake and types of foods eaten Add post meal BG check to assess BG changes at various times of day Check with insurance company on payment for different brands of strips  And possible financial savings on Humalog vs. Novolog, as well as pens vs. Vials of insulin Note that it takes a deficit of 3500 calories per week to lose 1 pound (500 cal / day average)

## 2012-01-02 ENCOUNTER — Other Ambulatory Visit (INDEPENDENT_AMBULATORY_CARE_PROVIDER_SITE_OTHER): Payer: Self-pay | Admitting: General Surgery

## 2012-01-07 ENCOUNTER — Ambulatory Visit: Payer: BC Managed Care – PPO | Admitting: *Deleted

## 2012-01-15 ENCOUNTER — Encounter (INDEPENDENT_AMBULATORY_CARE_PROVIDER_SITE_OTHER): Payer: BC Managed Care – PPO | Admitting: General Surgery

## 2012-03-15 ENCOUNTER — Ambulatory Visit: Payer: BC Managed Care – PPO | Admitting: Family Medicine

## 2012-03-15 DIAGNOSIS — B373 Candidiasis of vulva and vagina: Secondary | ICD-10-CM

## 2012-03-15 DIAGNOSIS — H9191 Unspecified hearing loss, right ear: Secondary | ICD-10-CM

## 2012-03-15 LAB — POCT WET PREP WITH KOH
KOH Prep POC: NEGATIVE
Trichomonas, UA: NEGATIVE
Yeast Wet Prep HPF POC: NEGATIVE

## 2012-03-15 NOTE — Progress Notes (Signed)
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    Patient comes in with external vulvar irritation.  She had a yeast infection last November when she had problems with abdominal pain, the kidney stone, and left kidney atrophy.  At the time, she was given a Diflucan tablet but did not resolve her symptoms, which are predominantly at the clitoris and its hood.  Patient has been self-treating herself since November with Monistat-3 x 2,   Diabetes numbers are 150-200;  The averages are 183 for month of April.  She continues to see Dr. Talmage Nap regularly.  Not sexually active for a year  O:  Alert, NAD  Overweight Vulva:  Diffuse erythema on medial vulvar labia.  External labia normal. Results for orders placed in visit on 03/15/12  POCT WET PREP WITH KOH      Component Value Range   Trichomonas, UA Negative     Clue Cells Wet Prep HPF POC 0-1     Epithelial Wet Prep HPF POC 0-1     Yeast Wet Prep HPF POC neg     Bacteria Wet Prep HPF POC 1+ cocci     RBC Wet Prep HPF POC 0-1     WBC Wet Prep HPF POC 0-2     KOH Prep POC Negative       A:  This has all the features of a chronic yeast vaginitis.  P: Diflucan 150 x 7

## 2012-06-16 ENCOUNTER — Emergency Department (HOSPITAL_COMMUNITY): Payer: BC Managed Care – PPO

## 2012-06-16 ENCOUNTER — Encounter (HOSPITAL_COMMUNITY): Payer: Self-pay | Admitting: *Deleted

## 2012-06-16 ENCOUNTER — Inpatient Hospital Stay (HOSPITAL_COMMUNITY)
Admission: EM | Admit: 2012-06-16 | Discharge: 2012-06-22 | DRG: 150 | Disposition: A | Discharge status: Home / Self Care | Payer: BC Managed Care – PPO | Attending: General Surgery | Admitting: General Surgery

## 2012-06-16 DIAGNOSIS — N1330 Unspecified hydronephrosis: Secondary | ICD-10-CM | POA: Diagnosis present

## 2012-06-16 DIAGNOSIS — K660 Peritoneal adhesions (postprocedural) (postinfection): Secondary | ICD-10-CM

## 2012-06-16 DIAGNOSIS — K430 Incisional hernia with obstruction, without gangrene: Secondary | ICD-10-CM

## 2012-06-16 DIAGNOSIS — N201 Calculus of ureter: Secondary | ICD-10-CM | POA: Diagnosis present

## 2012-06-16 DIAGNOSIS — Z794 Long term (current) use of insulin: Secondary | ICD-10-CM

## 2012-06-16 DIAGNOSIS — K56609 Unspecified intestinal obstruction, unspecified as to partial versus complete obstruction: Secondary | ICD-10-CM

## 2012-06-16 DIAGNOSIS — Z6838 Body mass index (BMI) 38.0-38.9, adult: Secondary | ICD-10-CM

## 2012-06-16 DIAGNOSIS — E119 Type 2 diabetes mellitus without complications: Secondary | ICD-10-CM | POA: Diagnosis present

## 2012-06-16 DIAGNOSIS — N189 Chronic kidney disease, unspecified: Secondary | ICD-10-CM | POA: Diagnosis present

## 2012-06-16 DIAGNOSIS — N269 Renal sclerosis, unspecified: Secondary | ICD-10-CM | POA: Diagnosis present

## 2012-06-16 DIAGNOSIS — Z9884 Bariatric surgery status: Secondary | ICD-10-CM

## 2012-06-16 LAB — CBC WITH DIFFERENTIAL/PLATELET
Basophils Absolute: 0 10*3/uL (ref 0.0–0.1)
Basophils Relative: 0 % (ref 0–1)
Eosinophils Relative: 1 % (ref 0–5)
HCT: 36.3 % (ref 36.0–46.0)
Hemoglobin: 11.3 g/dL — ABNORMAL LOW (ref 12.0–15.0)
Lymphocytes Relative: 33 % (ref 12–46)
MCHC: 31.1 g/dL (ref 30.0–36.0)
MCV: 78.4 fL (ref 78.0–100.0)
Monocytes Absolute: 0.8 10*3/uL (ref 0.1–1.0)
Monocytes Relative: 9 % (ref 3–12)
RDW: 14.7 % (ref 11.5–15.5)

## 2012-06-16 LAB — BASIC METABOLIC PANEL
BUN: 9 mg/dL (ref 6–23)
CO2: 25 mEq/L (ref 19–32)
Calcium: 8.9 mg/dL (ref 8.4–10.5)
Chloride: 104 mEq/L (ref 96–112)
Creatinine, Ser: 1.17 mg/dL — ABNORMAL HIGH (ref 0.50–1.10)

## 2012-06-16 LAB — URINALYSIS, ROUTINE W REFLEX MICROSCOPIC
Glucose, UA: NEGATIVE mg/dL
Hgb urine dipstick: NEGATIVE
Ketones, ur: NEGATIVE mg/dL
Protein, ur: NEGATIVE mg/dL
pH: 5.5 (ref 5.0–8.0)

## 2012-06-16 LAB — HEPATIC FUNCTION PANEL
ALT: 16 U/L (ref 0–35)
Albumin: 3.7 g/dL (ref 3.5–5.2)
Alkaline Phosphatase: 117 U/L (ref 39–117)
Total Bilirubin: 0.1 mg/dL — ABNORMAL LOW (ref 0.3–1.2)
Total Protein: 7 g/dL (ref 6.0–8.3)

## 2012-06-16 LAB — URINE MICROSCOPIC-ADD ON

## 2012-06-16 MED ORDER — ONDANSETRON HCL 4 MG/2ML IJ SOLN: 4.0000 mg | Freq: Four times a day (QID) | INTRAMUSCULAR | Status: DC | PRN

## 2012-06-16 MED ORDER — MORPHINE SULFATE 4 MG/ML IJ SOLN: 4.0000 mg | Freq: Once | INTRAMUSCULAR | Status: DC

## 2012-06-16 MED ORDER — PANTOPRAZOLE SODIUM 40 MG IV SOLR
40.0000 mg | Freq: Every day | INTRAVENOUS | Status: DC
  Administered 2012-06-16 – 2012-06-20 (×5): 40 mg via INTRAVENOUS
  Filled 2012-06-16 (×8): qty 40

## 2012-06-16 MED ORDER — KCL IN DEXTROSE-NACL 30-5-0.45 MEQ/L-%-% IV SOLN
INTRAVENOUS | Status: DC
  Administered 2012-06-16: 23:00:00 via INTRAVENOUS
  Filled 2012-06-16 (×6): qty 1000

## 2012-06-16 MED ORDER — SODIUM CHLORIDE 0.9 % IV BOLUS (SEPSIS)
1000.0000 mL | Freq: Once | INTRAVENOUS | Status: AC
  Administered 2012-06-16: 1000 mL via INTRAVENOUS

## 2012-06-16 MED ORDER — IOHEXOL 300 MG/ML  SOLN
80.0000 mL | Freq: Once | INTRAMUSCULAR | Status: AC | PRN
  Administered 2012-06-16: 80 mL via INTRAVENOUS

## 2012-06-16 MED ORDER — MORPHINE SULFATE 4 MG/ML IJ SOLN
8.0000 mg | Freq: Once | INTRAMUSCULAR | Status: AC
  Administered 2012-06-16: 8 mg via INTRAVENOUS
  Filled 2012-06-16 (×2): qty 1

## 2012-06-16 MED ORDER — INSULIN ASPART 100 UNIT/ML ~~LOC~~ SOLN: 0.0000 [IU] | SUBCUTANEOUS | Status: DC

## 2012-06-16 MED ORDER — MORPHINE SULFATE 2 MG/ML IJ SOLN
2.0000 mg | INTRAMUSCULAR | Status: DC | PRN
  Administered 2012-06-16: 2 mg via INTRAVENOUS
  Filled 2012-06-16: qty 1

## 2012-06-16 NOTE — ED Notes (Signed)
Pt reports abd pain centered behind belly button. Denies n/v/d. Pain comes in waves.

## 2012-06-16 NOTE — H&P (Signed)
Catherine Fowler is an 61 y.o. female.   Chief Complaint:   Abdominal pain for 4 days HPI: She developed abdominal pain that was very focal in the periumbilical region 4 days ago that has persisted. No nausea or vomiting. She is tolerating a diet. Her bowels are moving normally. No fever or chills. She presented to the emergency department for evaluation. A CT scan demonstrated that she had a ventral hernia with some small intestine in it. The reading remarked that it may be incarcerated with a partial small bowel obstruction, But clinically she has no signs of a small bowel obstruction. She had some morphine for pain and currently she feels fine.  Past Medical History  Diagnosis Date  . Diabetes mellitus   . Chronic kidney disease      Morbid obesity  Past Surgical History  Procedure Date  . Abdominal surgery-Jejunoileal bypass   . Appendectomy   . Cholecystectomy   . Tubal ligation   . Breast surgery 2/97    breast reduction   . Gastric bypass with takedown of jejunoileal bypass 10/16/2004  . Rhinoplasty     Family History  Problem Relation Age of Onset  . Diabetes type II Father   . Diabetes type II Sister   . Diabetes type II Other   . Diabetes type II Maternal Aunt    Social History:  reports that she has never smoked. She has never used smokeless tobacco. She reports that she does not drink alcohol or use illicit drugs.  Allergies: No Known Allergies  Prior to Admission medications   Medication Sig Start Date End Date Taking? Authorizing Provider  insulin glargine (LANTUS) 100 UNIT/ML injection Inject 20-72 Units into the skin 2 (two) times daily. Pt takes 72 units at night and 20units in the am   Yes Historical Provider, MD    (Not in a hospital admission)  Results for orders placed during the hospital encounter of 06/16/12 (from the past 48 hour(s))  URINALYSIS, ROUTINE W REFLEX MICROSCOPIC     Status: Abnormal   Collection Time   06/16/12  5:05 PM      Component Value  Range Comment   Color, Urine YELLOW  YELLOW    APPearance CLEAR  CLEAR    Specific Gravity, Urine 1.023  1.005 - 1.030    pH 5.5  5.0 - 8.0    Glucose, UA NEGATIVE  NEGATIVE mg/dL    Hgb urine dipstick NEGATIVE  NEGATIVE    Bilirubin Urine NEGATIVE  NEGATIVE    Ketones, ur NEGATIVE  NEGATIVE mg/dL    Protein, ur NEGATIVE  NEGATIVE mg/dL    Urobilinogen, UA 0.2  0.0 - 1.0 mg/dL    Nitrite NEGATIVE  NEGATIVE    Leukocytes, UA SMALL (*) NEGATIVE   URINE MICROSCOPIC-ADD ON     Status: Abnormal   Collection Time   06/16/12  5:05 PM      Component Value Range Comment   Squamous Epithelial / LPF FEW (*) RARE    WBC, UA 7-10  <3 WBC/hpf    Bacteria, UA RARE  RARE   CBC WITH DIFFERENTIAL     Status: Abnormal   Collection Time   06/16/12  5:29 PM      Component Value Range Comment   WBC 8.8  4.0 - 10.5 K/uL    RBC 4.63  3.87 - 5.11 MIL/uL    Hemoglobin 11.3 (*) 12.0 - 15.0 g/dL    HCT 40.9  81.1 - 91.4 %  MCV 78.4  78.0 - 100.0 fL    MCH 24.4 (*) 26.0 - 34.0 pg    MCHC 31.1  30.0 - 36.0 g/dL    RDW 21.3  08.6 - 57.8 %    Platelets 375  150 - 400 K/uL    Neutrophils Relative 57  43 - 77 %    Neutro Abs 5.0  1.7 - 7.7 K/uL    Lymphocytes Relative 33  12 - 46 %    Lymphs Abs 2.9  0.7 - 4.0 K/uL    Monocytes Relative 9  3 - 12 %    Monocytes Absolute 0.8  0.1 - 1.0 K/uL    Eosinophils Relative 1  0 - 5 %    Eosinophils Absolute 0.1  0.0 - 0.7 K/uL    Basophils Relative 0  0 - 1 %    Basophils Absolute 0.0  0.0 - 0.1 K/uL   BASIC METABOLIC PANEL     Status: Abnormal   Collection Time   06/16/12  5:29 PM      Component Value Range Comment   Sodium 140  135 - 145 mEq/L    Potassium 3.9  3.5 - 5.1 mEq/L    Chloride 104  96 - 112 mEq/L    CO2 25  19 - 32 mEq/L    Glucose, Bld 88  70 - 99 mg/dL    BUN 9  6 - 23 mg/dL    Creatinine, Ser 4.69 (*) 0.50 - 1.10 mg/dL    Calcium 8.9  8.4 - 62.9 mg/dL    GFR calc non Af Amer 49 (*) >90 mL/min    GFR calc Af Amer 56 (*) >90 mL/min   HEPATIC  FUNCTION PANEL     Status: Abnormal   Collection Time   06/16/12  6:18 PM      Component Value Range Comment   Total Protein 7.0  6.0 - 8.3 g/dL    Albumin 3.7  3.5 - 5.2 g/dL    AST 19  0 - 37 U/L    ALT 16  0 - 35 U/L    Alkaline Phosphatase 117  39 - 117 U/L    Total Bilirubin 0.1 (*) 0.3 - 1.2 mg/dL    Bilirubin, Direct <5.2  0.0 - 0.3 mg/dL    Indirect Bilirubin NOT CALCULATED  0.3 - 0.9 mg/dL    Ct Abdomen Pelvis W Contrast  06/16/2012  *RADIOLOGY REPORT*  Clinical Data: Abdominal pain.  CT ABDOMEN AND PELVIS WITH CONTRAST  Technique:  Multidetector CT imaging of the abdomen and pelvis was performed following the standard protocol during bolus administration of intravenous contrast.  Contrast: 80mL OMNIPAQUE IOHEXOL 300 MG/ML  SOLN  Comparison: CT 09/27/2011.  Findings: The lung bases are clear.  Minimal right basilar scarring.  The liver demonstrates diffuse fatty infiltration but no focal hepatic lesion.  The gallbladder is surgically absent.  No significant common bile duct dilatation.  The pancreas is normal. The spleen is normal in size.  Small stable hemangioma.  The adrenal glands and kidneys are stable.  There is a large chronic UPJ calculus on the left side with chronic hydronephrosis and associated renal cortical thinning.  The right kidney is unremarkable and stable.  No right-sided ureteral calculi and no bladder calculi.  The stomach demonstrates surgical changes related to gastric bypass surgery.  No complicating features are demonstrated.  There is a small anterior abdominal wall hernia containing a small bowel loop which appears incarcerated and  is causing a early small bowel obstruction no pneumatosis, free air or free fluid.  The aorta is normal in caliber.  Minimal atherosclerotic calcifications.  The major branch vessels are patent.  No mesenteric or retroperitoneal mass or adenopathy.  The uterus and ovaries are normal.  The bladder is normal.  No pelvic mass or free pelvic  fluid collections.  No inguinal mass or hernia.  The bony structures are intact.  IMPRESSION:  1.  Early small bowel obstruction due to an anterior abdominal wall hernia located near the umbilicus.  The entrapped small bowel loop appears thickened with some surrounding fluid suggesting incarceration. 2.  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  Original Report Authenticated By: P. Loralie Champagne, M.D.    Review of Systems  Constitutional: Negative for fever, chills and weight loss.  Respiratory: Negative for cough and shortness of breath.   Cardiovascular: Negative for chest pain.  Gastrointestinal: Positive for abdominal pain and blood in stool (bright red once with straining). Negative for heartburn, nausea, vomiting and diarrhea.  Genitourinary: Negative for dysuria and hematuria.  Neurological: Negative for seizures and weakness.  Endo/Heme/Allergies: Does not bruise/bleed easily.    Blood pressure 133/52, pulse 75, temperature 97.7 F (36.5 C), temperature source Oral, resp. rate 14, height 5\' 7"  (1.702 m), weight 220 lb (99.791 kg), SpO2 96.00%. Physical Exam  Constitutional:       Obese female in no acute distress very pleasant and cooperative.  HENT:  Head: Normocephalic and atraumatic.  Eyes: No scleral icterus.  Neck: Neck supple.  Cardiovascular: Normal rate and regular rhythm.   Respiratory: Effort normal and breath sounds normal.  GI: Soft. She exhibits no distension and no mass. There is tenderness (Very mild tenderness to the right of the umbilicusbut no obvious hernia is palpable). There is no guarding.  Musculoskeletal: She exhibits no edema.  Lymphadenopathy:    She has no cervical adenopathy.  Skin: Skin is warm and dry.  Psychiatric: She has a normal mood and affect. Her behavior is normal.     Assessment/Plan Symptomatic ventral incisional hernia with no clinical signs of strangulation or intestinal obstruction. I suspect this is  been going on for at least 4 days or more. She currently is pain free and feeling well.  Plan: I'm going to admit her to the hospital. I do think she needs repair of the ventral hernia but I do not think she needs an emergency repair at this time. We can plan on doing a repair tomorrow.  I have discussed the procedure, risks, and aftercare. Risks include but are not limited to bleeding, infection, wound healing problems, anesthesia, recurrence, accidental injury to intra-abdominal organs-such as intestine, liver, spleen, bladder, etc. We also discussed the possibility of using mesh. She seems to understand and agrees with the plan.  Ravis Herne J 06/16/2012, 9:23 PM

## 2012-06-16 NOTE — ED Notes (Signed)
Admitting MD at bedside.

## 2012-06-16 NOTE — ED Provider Notes (Addendum)
61 year old female has been having crampy periumbilical pain for the last 4 days. It started the day after she a lot of fruits and nuts. Pain has been getting progressively worse. There's been no associated nausea, vomiting. She's had loose bowel movements but no overt diarrhea. On exam, she is periumbilical tenderness with no other areas of tenderness. There is no rebound or guarding. Peristalsis is decreased. Midline abdominal incision is present without any palpable hernia. Other workup is unremarkable, but CT scan shows a partial small bowel obstruction due to an incarcerated hernia in the anterior abdominal wall near the umbilicus. General surgery consultation will be obtained  Dione Booze, MD 06/16/12 2023   Date: 06/16/2012  Rate: 76  Rhythm: normal sinus rhythm  QRS Axis: normal  Intervals: normal  ST/T Wave abnormalities: nonspecific T wave changes  Conduction Disutrbances:none  Narrative Interpretation: Minor nonspecific T wave changes. No prior ECG available for comparison.  Old EKG Reviewed: none available    Dione Booze, MD 06/16/12 2156

## 2012-06-16 NOTE — ED Notes (Signed)
Report given to [facility] RN 4W

## 2012-06-16 NOTE — ED Provider Notes (Signed)
Medical screening examination/treatment/procedure(s) were conducted as a shared visit with non-physician practitioner(s) and myself.  I personally evaluated the patient during the encounter   Dione Booze, MD 06/16/12 2024

## 2012-06-16 NOTE — ED Provider Notes (Signed)
History     CSN: [id number]  Arrival date & time 06/16/12  1515   First MD Initiated Contact with Patient 06/16/12 1734      Chief Complaint  Patient presents with  . Abdominal Pain    (Consider location/radiation/quality/duration/timing/severity/associated sxs/prior treatment) HPI  Patient with history of diabetes and history of chronic kidney disease presents complaining of abdominal pain. Pt reports for the past 5 days she has had gradual onset of periumbilical pain.  Described pain as an achy sensation that is initially constant, but since yesterday it has intensify, and coming in waves.  Pain is non radiating.  Denies fever, chills, cp, sob, n/v/d, or urinary sxs.  Does tried OTC aleve without adequate relief.  Last BM yesterday with loose stool.  Able to pass flatus.  Pt drove herself to ER today and notice increasing pain when hitting bumps.  Pt has prior appendectomy, cholecystectomy, and gastric bypass.  Pt also has prior hx of intra-abdominal abscess last year.    Past Medical History  Diagnosis Date  . Diabetes mellitus   . Chronic kidney disease     Past Surgical History  Procedure Date  . Abdominal surgery   . Appendectomy   . Cholecystectomy   . Tubal ligation   . Breast surgery 2/97    breast reduction   . Gastric bypass 10/16/2004  . Rhinoplasty     Family History  Problem Relation Age of Onset  . Diabetes type II Father   . Diabetes type II Sister   . Diabetes type II Other   . Diabetes type II Maternal Aunt     History  Substance Use Topics  . Smoking status: Never Smoker   . Smokeless tobacco: Never Used  . Alcohol Use: No    OB History    Grav Para Term Preterm Abortions TAB SAB Ect Mult Living                  Review of Systems  All other systems reviewed and are negative.    Allergies  Review of patient's allergies indicates no known allergies.  Home Medications   Current Outpatient Rx  Name Route Sig Dispense Refill  . INSULIN  GLARGINE 100 UNIT/ML [place] SOLN Subcutaneous Inject 20-72 Units into the skin 2 (two) times daily. Pt takes 72 units at night and 20units in the am      BP 158/61  Pulse 80  Temp 98.8 F (37.1 C) (Oral)  Resp 18  Ht 5\' 7"  (1.702 m)  Wt 220 lb (99.791 kg)  BMI 34.46 kg/m2  SpO2 99%  Physical Exam  Nursing note and vitals reviewed. Constitutional: She is oriented to person, place, and time. She appears well-developed and well-nourished. No distress.  HENT:  Head: Atraumatic.  Mouth/Throat: Oropharynx is clear and moist.  Eyes: Conjunctivae are normal.  Neck: Neck supple.  Cardiovascular: Normal rate and regular rhythm.   Pulmonary/Chest: Effort normal and breath sounds normal. No respiratory distress. She exhibits no tenderness.  Abdominal: She exhibits distension. She exhibits no mass. There is tenderness in the periumbilical area. There is guarding. There is no rebound and no CVA tenderness. No hernia. Hernia confirmed negative in the ventral area.    Musculoskeletal: Normal range of motion. She exhibits no edema.  Neurological: She is alert and oriented to person, place, and time.  Skin: Skin is warm. No rash noted.  Psychiatric: She has a normal mood and affect.    ED Course  Procedures (including  critical care time)   Labs Reviewed  URINALYSIS, ROUTINE W REFLEX MICROSCOPIC  CBC WITH DIFFERENTIAL  BASIC METABOLIC PANEL   Results for orders placed during the hospital encounter of 06/16/12  URINALYSIS, ROUTINE W REFLEX MICROSCOPIC      Component Value Range   Color, Urine YELLOW  YELLOW   APPearance CLEAR  CLEAR   Specific Gravity, Urine 1.023  1.005 - 1.030   pH 5.5  5.0 - 8.0   Glucose, UA NEGATIVE  NEGATIVE mg/dL   Hgb urine dipstick NEGATIVE  NEGATIVE   Bilirubin Urine NEGATIVE  NEGATIVE   Ketones, ur NEGATIVE  NEGATIVE mg/dL   Protein, ur NEGATIVE  NEGATIVE mg/dL   Urobilinogen, UA 0.2  0.0 - 1.0 mg/dL   Nitrite NEGATIVE  NEGATIVE   Leukocytes, UA SMALL (*)  NEGATIVE  CBC WITH DIFFERENTIAL      Component Value Range   WBC 8.8  4.0 - 10.5 K/uL   RBC 4.63  3.87 - 5.11 MIL/uL   Hemoglobin 11.3 (*) 12.0 - 15.0 g/dL   HCT 16.1  09.6 - 04.5 %   MCV 78.4  78.0 - 100.0 fL   MCH 24.4 (*) 26.0 - 34.0 pg   MCHC 31.1  30.0 - 36.0 g/dL   RDW 40.9  81.1 - 91.4 %   Platelets 375  150 - 400 K/uL   Neutrophils Relative 57  43 - 77 %   Neutro Abs 5.0  1.7 - 7.7 K/uL   Lymphocytes Relative 33  12 - 46 %   Lymphs Abs 2.9  0.7 - 4.0 K/uL   Monocytes Relative 9  3 - 12 %   Monocytes Absolute 0.8  0.1 - 1.0 K/uL   Eosinophils Relative 1  0 - 5 %   Eosinophils Absolute 0.1  0.0 - 0.7 K/uL   Basophils Relative 0  0 - 1 %   Basophils Absolute 0.0  0.0 - 0.1 K/uL  BASIC METABOLIC PANEL      Component Value Range   Sodium 140  135 - 145 mEq/L   Potassium 3.9  3.5 - 5.1 mEq/L   Chloride 104  96 - 112 mEq/L   CO2 25  19 - 32 mEq/L   Glucose, Bld 88  70 - 99 mg/dL   BUN 9  6 - 23 mg/dL   Creatinine, Ser 7.82 (*) 0.50 - 1.10 mg/dL   Calcium 8.9  8.4 - 95.6 mg/dL   GFR calc non Af Amer 49 (*) >90 mL/min   GFR calc Af Amer 56 (*) >90 mL/min  URINE MICROSCOPIC-ADD ON      Component Value Range   Squamous Epithelial / LPF FEW (*) RARE   WBC, UA 7-10  <3 WBC/hpf   Bacteria, UA RARE  RARE  HEPATIC FUNCTION PANEL      Component Value Range   Total Protein 7.0  6.0 - 8.3 g/dL   Albumin 3.7  3.5 - 5.2 g/dL   AST 19  0 - 37 U/L   ALT 16  0 - 35 U/L   Alkaline Phosphatase 117  39 - 117 U/L   Total Bilirubin 0.1 (*) 0.3 - 1.2 mg/dL   Bilirubin, Direct <2.1  0.0 - 0.3 mg/dL   Indirect Bilirubin NOT CALCULATED  0.3 - 0.9 mg/dL   No results found.     MDM  Pt with multiple abd surgery and prior hx of intra-abdominal abscesses presents with gradual onset of periumbilical abdominal pain.  Her abd  is distended and tender on palpation.  Plan to check belly labs, CBC, and will obtain abd/pelvis CT scan pending her kidney function.  Pain medication given.    7:19  PM Abdominal pain improves with pain medication.  Will obtain abd/pelvic CT scan. Cr. 1.17 today.  Discussed care with my attending.    7:49 PM Since pt has no urinary sxs, i have low suspicion that pt has a UTI.  However, urine culture sent.   Report given to oncoming PA, who will monitor for CT result and will d/c pending result.  Pt otherwise in NAD, drinking contrast at this time. Pain is controlled.      Fayrene Helper, PA-C 06/16/12 [phone]

## 2012-06-17 ENCOUNTER — Encounter (HOSPITAL_COMMUNITY): Payer: Self-pay | Admitting: Anesthesiology

## 2012-06-17 ENCOUNTER — Inpatient Hospital Stay (HOSPITAL_COMMUNITY): Payer: BC Managed Care – PPO | Admitting: Anesthesiology

## 2012-06-17 ENCOUNTER — Encounter (HOSPITAL_COMMUNITY): Admission: EM | Disposition: A | Discharge status: Home / Self Care | Payer: Self-pay | Source: Home / Self Care

## 2012-06-17 ENCOUNTER — Inpatient Hospital Stay (HOSPITAL_COMMUNITY): Payer: BC Managed Care – PPO

## 2012-06-17 ENCOUNTER — Encounter (HOSPITAL_COMMUNITY): Payer: Self-pay

## 2012-06-17 HISTORY — PX: VENTRAL HERNIA REPAIR: SHX424

## 2012-06-17 LAB — GLUCOSE, CAPILLARY
Glucose-Capillary: 110 mg/dL — ABNORMAL HIGH (ref 70–99)
Glucose-Capillary: 137 mg/dL — ABNORMAL HIGH (ref 70–99)
Glucose-Capillary: 115 mg/dL — ABNORMAL HIGH (ref 70–99)

## 2012-06-17 LAB — CBC
HCT: 31.7 % — ABNORMAL LOW (ref 36.0–46.0)
Hemoglobin: 9.8 g/dL — ABNORMAL LOW (ref 12.0–15.0)
MCH: 24.3 pg — ABNORMAL LOW (ref 26.0–34.0)
MCV: 78.7 fL (ref 78.0–100.0)
RBC: 4.03 MIL/uL (ref 3.87–5.11)

## 2012-06-17 LAB — URINE CULTURE
Colony Count: NO GROWTH
Culture: NO GROWTH

## 2012-06-17 LAB — BASIC METABOLIC PANEL
CO2: 24 mEq/L (ref 19–32)
Calcium: 8.3 mg/dL — ABNORMAL LOW (ref 8.4–10.5)
Chloride: 107 mEq/L (ref 96–112)
Glucose, Bld: 92 mg/dL (ref 70–99)
Potassium: 3.6 mEq/L (ref 3.5–5.1)
Sodium: 140 mEq/L (ref 135–145)

## 2012-06-17 LAB — HEMOGLOBIN A1C: Hgb A1c MFr Bld: 9.4 % — ABNORMAL HIGH (ref <5.7)

## 2012-06-17 SURGERY — REPAIR, HERNIA, VENTRAL
Anesthesia: General | Site: Abdomen | Wound class: Clean

## 2012-06-17 MED ORDER — CEFAZOLIN SODIUM 1-5 GM-% IV SOLN
1.0000 g | Freq: Three times a day (TID) | INTRAVENOUS | Status: AC
  Administered 2012-06-17 – 2012-06-18 (×2): 1 g via INTRAVENOUS
  Filled 2012-06-17 (×2): qty 50

## 2012-06-17 MED ORDER — CISATRACURIUM BESYLATE (PF) 10 MG/5ML IV SOLN
INTRAVENOUS | Status: DC | PRN
  Administered 2012-06-17: 10 mg via INTRAVENOUS
  Administered 2012-06-17 (×2): 2 mg via INTRAVENOUS
  Administered 2012-06-17: 6 mg via INTRAVENOUS

## 2012-06-17 MED ORDER — HEPARIN SODIUM (PORCINE) 5000 UNIT/ML IJ SOLN
5000.0000 [IU] | Freq: Once | INTRAMUSCULAR | Status: AC
  Administered 2012-06-17: 5000 [IU] via SUBCUTANEOUS
  Filled 2012-06-17: qty 1

## 2012-06-17 MED ORDER — HYDROMORPHONE HCL PF 1 MG/ML IJ SOLN
0.2500 mg | INTRAMUSCULAR | Status: DC | PRN
  Administered 2012-06-17 (×4): 0.5 mg via INTRAVENOUS

## 2012-06-17 MED ORDER — HYDROMORPHONE HCL PF 1 MG/ML IJ SOLN
INTRAMUSCULAR | Status: AC
  Filled 2012-06-17: qty 1

## 2012-06-17 MED ORDER — MORPHINE SULFATE 4 MG/ML IJ SOLN
3.0000 mg | Freq: Once | INTRAMUSCULAR | Status: AC
  Administered 2012-06-17: 3 mg via INTRAVENOUS

## 2012-06-17 MED ORDER — ONDANSETRON HCL 4 MG/2ML IJ SOLN: 4.0000 mg | Freq: Four times a day (QID) | INTRAMUSCULAR | Status: DC | PRN

## 2012-06-17 MED ORDER — FENTANYL CITRATE 0.05 MG/ML IJ SOLN
INTRAMUSCULAR | Status: DC | PRN
  Administered 2012-06-17 (×11): 50 ug via INTRAVENOUS

## 2012-06-17 MED ORDER — MORPHINE SULFATE 2 MG/ML IJ SOLN
2.0000 mg | INTRAMUSCULAR | Status: DC | PRN
Start: 2012-06-17 — End: 2012-06-20

## 2012-06-17 MED ORDER — PROPOFOL 10 MG/ML IV EMUL
INTRAVENOUS | Status: DC | PRN
  Administered 2012-06-17: 200 mg via INTRAVENOUS

## 2012-06-17 MED ORDER — MORPHINE SULFATE (PF) 1 MG/ML IV SOLN
INTRAVENOUS | Status: DC
  Administered 2012-06-17: 12 mg via INTRAVENOUS
  Administered 2012-06-17: 22 mg via INTRAVENOUS
  Administered 2012-06-17: 25 mg via INTRAVENOUS
  Administered 2012-06-17: 1 mg via INTRAVENOUS
  Administered 2012-06-18: 25 mg via INTRAVENOUS
  Administered 2012-06-18: 6 mg via INTRAVENOUS
  Administered 2012-06-18: 01:00:00 via INTRAVENOUS
  Administered 2012-06-18: 9 mg via INTRAVENOUS
  Administered 2012-06-18: 12 mg via INTRAVENOUS
  Administered 2012-06-18: 4.5 mg via INTRAVENOUS
  Administered 2012-06-18: 1.4 mg via INTRAVENOUS
  Administered 2012-06-19: 1.5 mg via INTRAVENOUS
  Administered 2012-06-19: 07:00:00 via INTRAVENOUS
  Administered 2012-06-19: 3 mg via INTRAVENOUS
  Administered 2012-06-19: 1.5 mg via INTRAVENOUS
  Administered 2012-06-20: 3 mg via INTRAVENOUS
  Administered 2012-06-20: 6 mg via INTRAVENOUS
  Administered 2012-06-20: 1.5 mg via INTRAVENOUS
  Filled 2012-06-17 (×4): qty 25

## 2012-06-17 MED ORDER — INSULIN ASPART 100 UNIT/ML ~~LOC~~ SOLN
0.0000 [IU] | SUBCUTANEOUS | Status: DC
  Administered 2012-06-17 – 2012-06-18 (×2): 3 [IU] via SUBCUTANEOUS

## 2012-06-17 MED ORDER — CEFAZOLIN SODIUM 1-5 GM-% IV SOLN
INTRAVENOUS | Status: AC
  Filled 2012-06-17: qty 50

## 2012-06-17 MED ORDER — CEFAZOLIN SODIUM 1-5 GM-% IV SOLN: INTRAVENOUS | Status: DC | PRN

## 2012-06-17 MED ORDER — ONDANSETRON HCL 4 MG/2ML IJ SOLN
INTRAMUSCULAR | Status: DC | PRN
  Administered 2012-06-17 (×2): 2 mg via INTRAVENOUS

## 2012-06-17 MED ORDER — MORPHINE SULFATE 2 MG/ML IJ SOLN: 2.0000 mg | INTRAMUSCULAR | Status: DC | PRN

## 2012-06-17 MED ORDER — CEFAZOLIN SODIUM-DEXTROSE 2-3 GM-% IV SOLR
INTRAVENOUS | Status: AC
  Filled 2012-06-17: qty 50

## 2012-06-17 MED ORDER — ONDANSETRON HCL 4 MG PO TABS: 4.0000 mg | ORAL_TABLET | Freq: Four times a day (QID) | ORAL | Status: DC | PRN

## 2012-06-17 MED ORDER — HEPARIN SODIUM (PORCINE) 5000 UNIT/ML IJ SOLN
INTRAMUSCULAR | Status: AC
  Filled 2012-06-17: qty 1

## 2012-06-17 MED ORDER — LIDOCAINE HCL (CARDIAC) 20 MG/ML IV SOLN
INTRAVENOUS | Status: DC | PRN
  Administered 2012-06-17: 75 mg via INTRAVENOUS

## 2012-06-17 MED ORDER — POTASSIUM CHLORIDE IN NACL 20-0.9 MEQ/L-% IV SOLN
INTRAVENOUS | Status: DC
  Administered 2012-06-17: 22:00:00 via INTRAVENOUS
  Administered 2012-06-17: 1000 mL via INTRAVENOUS
  Administered 2012-06-18 (×2): via INTRAVENOUS
  Administered 2012-06-19 (×2): 125 mL via INTRAVENOUS
  Administered 2012-06-19 – 2012-06-21 (×2): via INTRAVENOUS
  Filled 2012-06-17 (×12): qty 1000

## 2012-06-17 MED ORDER — MIDAZOLAM HCL 5 MG/5ML IJ SOLN
INTRAMUSCULAR | Status: DC | PRN
  Administered 2012-06-17 (×2): 1 mg via INTRAVENOUS

## 2012-06-17 MED ORDER — DIPHENHYDRAMINE HCL 50 MG/ML IJ SOLN: 12.5000 mg | Freq: Four times a day (QID) | INTRAMUSCULAR | Status: DC | PRN

## 2012-06-17 MED ORDER — MORPHINE SULFATE (PF) 1 MG/ML IV SOLN
INTRAVENOUS | Status: AC
  Administered 2012-06-18: 25 mg via INTRAVENOUS
  Filled 2012-06-17: qty 25

## 2012-06-17 MED ORDER — DEXTROSE 5 % IV SOLN
3.0000 g | Freq: Three times a day (TID) | INTRAVENOUS | Status: DC
  Filled 2012-06-17 (×2): qty 3000

## 2012-06-17 MED ORDER — OXYCODONE-ACETAMINOPHEN 5-325 MG PO TABS
1.0000 | ORAL_TABLET | ORAL | Status: DC | PRN
  Administered 2012-06-17 – 2012-06-22 (×5): 2 via ORAL
  Filled 2012-06-17 (×5): qty 2

## 2012-06-17 MED ORDER — NALOXONE HCL 0.4 MG/ML IJ SOLN: 0.4000 mg | INTRAMUSCULAR | Status: DC | PRN

## 2012-06-17 MED ORDER — 0.9 % SODIUM CHLORIDE (POUR BTL) OPTIME
TOPICAL | Status: DC | PRN
  Administered 2012-06-17: 1000 mL

## 2012-06-17 MED ORDER — LACTATED RINGERS IV SOLN
INTRAVENOUS | Status: DC | PRN
  Administered 2012-06-17 (×2): via INTRAVENOUS

## 2012-06-17 MED ORDER — MORPHINE SULFATE 4 MG/ML IJ SOLN
INTRAMUSCULAR | Status: AC
  Filled 2012-06-17: qty 1

## 2012-06-17 MED ORDER — DIPHENHYDRAMINE HCL 12.5 MG/5ML PO ELIX: 12.5000 mg | ORAL_SOLUTION | Freq: Four times a day (QID) | ORAL | Status: DC | PRN

## 2012-06-17 MED ORDER — DEXTROSE 5 % IV SOLN
3.0000 g | INTRAVENOUS | Status: AC
  Administered 2012-06-17: 3 g via INTRAVENOUS

## 2012-06-17 MED ORDER — HEPARIN SODIUM (PORCINE) 5000 UNIT/ML IJ SOLN
5000.0000 [IU] | Freq: Three times a day (TID) | INTRAMUSCULAR | Status: DC
  Administered 2012-06-17 – 2012-06-22 (×15): 5000 [IU] via SUBCUTANEOUS
  Filled 2012-06-17 (×18): qty 1

## 2012-06-17 MED ORDER — CHLORHEXIDINE GLUCONATE 4 % EX LIQD
1.0000 "application " | Freq: Once | CUTANEOUS | Status: DC
  Filled 2012-06-17: qty 15

## 2012-06-17 MED ORDER — FENTANYL CITRATE 0.05 MG/ML IJ SOLN
25.0000 ug | INTRAMUSCULAR | Status: DC | PRN
  Administered 2012-06-17 (×3): 25 ug via INTRAVENOUS

## 2012-06-17 MED ORDER — SODIUM CHLORIDE 0.9 % IJ SOLN: 9.0000 mL | INTRAMUSCULAR | Status: DC | PRN

## 2012-06-17 MED ORDER — PROMETHAZINE HCL 25 MG/ML IJ SOLN: 6.2500 mg | INTRAMUSCULAR | Status: DC | PRN

## 2012-06-17 MED ORDER — LACTATED RINGERS IV SOLN
INTRAVENOUS | Status: DC
  Administered 2012-06-17 (×2): 1000 mL via INTRAVENOUS

## 2012-06-17 MED ORDER — SUCCINYLCHOLINE CHLORIDE 20 MG/ML IJ SOLN
INTRAMUSCULAR | Status: DC | PRN
  Administered 2012-06-17: 140 mg via INTRAVENOUS

## 2012-06-17 MED ORDER — FENTANYL CITRATE 0.05 MG/ML IJ SOLN
INTRAMUSCULAR | Status: AC
  Filled 2012-06-17: qty 2

## 2012-06-17 SURGICAL SUPPLY — 40 items
BINDER ABD UNIV 12 45-62 (WOUND CARE) IMPLANT
BINDER ABDOMINAL 46IN 62IN (WOUND CARE)
BLADE HEX COATED 2.75 (ELECTRODE) ×2 IMPLANT
CANISTER SUCTION 2500CC (MISCELLANEOUS) ×2 IMPLANT
CLOTH BEACON ORANGE TIMEOUT ST (SAFETY) ×2 IMPLANT
DRAIN CHANNEL RND F F (WOUND CARE) ×4 IMPLANT
DRAPE LAPAROSCOPIC ABDOMINAL (DRAPES) ×2 IMPLANT
DRAPE POUCH INSTRU U-SHP 10X18 (DRAPES) ×2 IMPLANT
DRSG PAD ABDOMINAL 8X10 ST (GAUZE/BANDAGES/DRESSINGS) ×2 IMPLANT
ELECT REM PT RETURN 9FT ADLT (ELECTROSURGICAL) ×2
ELECTRODE REM PT RTRN 9FT ADLT (ELECTROSURGICAL) ×1 IMPLANT
EVACUATOR SILICONE 100CC (DRAIN) ×4 IMPLANT
GLOVE BIOGEL PI IND STRL 7.0 (GLOVE) ×1 IMPLANT
GLOVE BIOGEL PI INDICATOR 7.0 (GLOVE) ×1
GLOVE EUDERMIC 7 POWDERFREE (GLOVE) ×2 IMPLANT
GOWN STRL NON-REIN LRG LVL3 (GOWN DISPOSABLE) ×2 IMPLANT
GOWN STRL REIN XL XLG (GOWN DISPOSABLE) ×4 IMPLANT
KIT BASIN OR (CUSTOM PROCEDURE TRAY) ×2 IMPLANT
NS IRRIG 1000ML POUR BTL (IV SOLUTION) ×2 IMPLANT
PACK GENERAL/GYN (CUSTOM PROCEDURE TRAY) ×2 IMPLANT
PEN SKIN MARKING BROAD (MISCELLANEOUS) ×2 IMPLANT
SPONGE GAUZE 4X4 12PLY (GAUZE/BANDAGES/DRESSINGS) ×2 IMPLANT
SPONGE LAP 18X18 X RAY DECT (DISPOSABLE) ×2 IMPLANT
STAPLER VISISTAT 35W (STAPLE) ×2 IMPLANT
SUT ETHILON 3 0 PS 1 (SUTURE) IMPLANT
SUT NOVA 1 T20/GS 25DT (SUTURE) ×8 IMPLANT
SUT NOVA NAB DX-16 0-1 5-0 T12 (SUTURE) IMPLANT
SUT PDS AB 1 CTX 36 (SUTURE) IMPLANT
SUT PROLENE 0 CT 1 CR/8 (SUTURE) IMPLANT
SUT SILK 2 0 (SUTURE)
SUT SILK 2 0 30  PSL (SUTURE)
SUT SILK 2 0 30 PSL (SUTURE) IMPLANT
SUT SILK 2-0 18XBRD TIE 12 (SUTURE) IMPLANT
SUT VIC AB 2-0 CT1 27 (SUTURE) ×2
SUT VIC AB 2-0 CT1 27XBRD (SUTURE) ×2 IMPLANT
SUT VIC AB 2-0 CTX 36 (SUTURE) ×6 IMPLANT
TISSUE MATRIX STRATTICE 16X20 (Tissue) ×1 IMPLANT
TISSUE MATRIX STRATTICE 20X16 (Tissue) ×1 IMPLANT
TOWEL OR 17X26 10 PK STRL BLUE (TOWEL DISPOSABLE) ×2 IMPLANT
TRAY FOLEY CATH 14FRSI W/METER (CATHETERS) ×2 IMPLANT

## 2012-06-17 NOTE — Progress Notes (Addendum)
Subjective: Patient interviewed and examined. Prior hospital records reviewed. History of open jejunoileal bypass, tubal ligation and cholecystectomy noted. History laparoscopic takedown of jejunoileal bypass in many gastric bypass in [facility]. Hospitalization last November for Klebsiella abdominal abscess of uncertain etiology. Morbid obesity. Diabetes mellitus. Chronic left UPJ stone with renal atrophy noted.  Admitted last month with a four-day history of periumbilical pain. This is better this morning. She has had no alteration in her bowel habits. No nausea but appetite has been diminished over the past 4 days. CT scan shows a ventral incisional hernia in the periumbilical area. Possible low-grade obstruction radiographically or clinically she is not obstructed. There is no evidence of recurrent abscess in this location.  She says her pain this morning is minimal. She knows if she needs surgery to repair this and is willing to undergo that.  Objective: Vital signs in last 24 hours: Temp:  [97.7 F (36.5 C)-98.8 F (37.1 C)] 98.1 F (36.7 C) (08/05 2308) Pulse Rate:  [75-80] 77  (08/05 2308) Resp:  [14-18] 16  (08/05 2308) BP: (133-158)/(52-65) 150/65 mmHg (08/05 2308) SpO2:  [96 %-100 %] 100 % (08/05 2308) Weight:  [220 lb (99.791 kg)] 220 lb (99.791 kg) (08/05 1651) Last BM Date: 06/16/12  Intake/Output from previous day:   Intake/Output this shift:    General appearance: morbidly obese female, pleasant, cooperative, in no distress. Resp: clear to auscultation bilaterally GI:  obese. Long midline incision with very mild tenderness around and to the right of the umbilicus. I might have felt a little bit of tissue squishing around but cannot feel a hernia defect.  Not obviously distended or tympanitic. No skin erythema.  No guarding.  Lab Results:   Basename 06/17/12 0418 06/16/12 1729  WBC 6.3 8.8  HGB 9.8* 11.3*  HCT 31.7* 36.3  PLT 309 375   BMET  Basename 06/17/12  0418 06/16/12 1729  NA 140 140  K 3.6 3.9  CL 107 104  CO2 24 25  GLUCOSE 92 88  BUN 8 9  CREATININE 1.26* 1.17*  CALCIUM 8.3* 8.9   PT/INR No results found for this basename: LABPROT:2,INR:2 in the last 72 hours ABG No results found for this basename: PHART:2,PCO2:2,PO2:2,HCO3:2 in the last 72 hours  Studies/Results: Ct Abdomen Pelvis W Contrast  06/16/2012  *RADIOLOGY REPORT*  Clinical Data: Abdominal pain.  CT ABDOMEN AND PELVIS WITH CONTRAST  Technique:  Multidetector CT imaging of the abdomen and pelvis was performed following the standard protocol during bolus administration of intravenous contrast.  Contrast: 80mL OMNIPAQUE IOHEXOL 300 MG/ML  SOLN  Comparison: CT 09/27/2011.  Findings: The lung bases are clear.  Minimal right basilar scarring.  The liver demonstrates diffuse fatty infiltration but no focal hepatic lesion.  The gallbladder is surgically absent.  No significant common bile duct dilatation.  The pancreas is normal. The spleen is normal in size.  Small stable hemangioma.  The adrenal glands and kidneys are stable.  There is a large chronic UPJ calculus on the left side with chronic hydronephrosis and associated renal cortical thinning.  The right kidney is unremarkable and stable.  No right-sided ureteral calculi and no bladder calculi.  The stomach demonstrates surgical changes related to gastric bypass surgery.  No complicating features are demonstrated.  There is a small anterior abdominal wall hernia containing a small bowel loop which appears incarcerated and is causing a early small bowel obstruction no pneumatosis, free air or free fluid.  The aorta is normal in caliber.  Minimal atherosclerotic calcifications.  The major branch vessels are patent.  No mesenteric or retroperitoneal mass or adenopathy.  The uterus and ovaries are normal.  The bladder is normal.  No pelvic mass or free pelvic fluid collections.  No inguinal mass or hernia.  The bony structures are intact.   IMPRESSION:  1.  Early small bowel obstruction due to an anterior abdominal wall hernia located near the umbilicus.  The entrapped small bowel loop appears thickened with some surrounding fluid suggesting incarceration. 2.  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  Original Report Authenticated By: P. Loralie Champagne, M.D.    Anti-infectives: Anti-infectives    None      Assessment/Plan:  Ventral incisional hernia, possibly incarcerated. No evidence of compromised bowel at this time.the patient has been advised that surgical repair of her hernia is appropriate because of her pain and possible incarceration and possible partial small bowel junction. She is in full agreement.  I discussed the indications, and details, techniques, and numerous risks of this surgery in great detail with the patient. Her questions were answered. She understands these issues. She agrees with this plan.  History laparotomy for jejunoileal bypass with subsequent reversal. Also history percutaneous drainage of klebsiella bdominal abscess November 2012. Suspect she may have moderate to severe adhesions.  Morbid obesity  Chronic kidney disease with chronic left renal atrophy from chronic UPJ stone.   Diabetes mellitus.   LOS: 1 day    Prezley Qadir M. Derrell Lolling, M.D., [facility] General and Minimally invasive Surgery Breast and Colorectal Surgery Office:   [phone] Pager:   [phone]  06/17/2012

## 2012-06-17 NOTE — Anesthesia Preprocedure Evaluation (Addendum)
Anesthesia Evaluation  Patient identified by MRN, date of birth, ID band Patient awake    Reviewed: Allergy & Precautions, H&P , NPO status , Patient's Chart, lab work & pertinent test results  Airway Mallampati: II TM Distance: <3 FB Neck ROM: Full    Dental No notable dental hx.    Pulmonary neg pulmonary ROS,  breath sounds clear to auscultation  Pulmonary exam normal       Cardiovascular hypertension, Rhythm:Regular Rate:Normal     Neuro/Psych negative neurological ROS  negative psych ROS   GI/Hepatic negative GI ROS, Neg liver ROS,   Endo/Other  Insulin DependentMorbid obesity  Renal/GU negative Renal ROS  negative genitourinary   Musculoskeletal negative musculoskeletal ROS (+)   Abdominal   Peds negative pediatric ROS (+)  Hematology negative hematology ROS (+)   Anesthesia Other Findings   Reproductive/Obstetrics negative OB ROS                           Anesthesia Physical Anesthesia Plan  ASA: III  Anesthesia Plan: General   Post-op Pain Management:    Induction: Intravenous, Rapid sequence and Cricoid pressure planned  Airway Management Planned: Oral ETT  Additional Equipment:   Intra-op Plan:   Post-operative Plan: Extubation in OR  Informed Consent: I have reviewed the patients History and Physical, chart, labs and discussed the procedure including the risks, benefits and alternatives for the proposed anesthesia with the patient or authorized representative who has indicated his/her understanding and acceptance.   Dental advisory given  Plan Discussed with: CRNA and Surgeon  Anesthesia Plan Comments:        Anesthesia Quick Evaluation

## 2012-06-17 NOTE — Anesthesia Postprocedure Evaluation (Signed)
  Anesthesia Post-op Note  Patient: Catherine Fowler  Procedure(s) Performed: Procedure(s) (LRB): HERNIA REPAIR VENTRAL ADULT (N/A) INSERTION OF MESH (N/A)  Patient Location: PACU  Anesthesia Type: General  Level of Consciousness: awake and alert   Airway and Oxygen Therapy: Patient Spontanous Breathing  Post-op Pain: mild  Post-op Assessment: Post-op Vital signs reviewed, Patient's Cardiovascular Status Stable, Respiratory Function Stable, Patent Airway and No signs of Nausea or vomiting  Post-op Vital Signs: stable  Complications: No apparent anesthesia complications

## 2012-06-17 NOTE — Anesthesia Procedure Notes (Signed)
Procedure Name: Intubation Date/Time: 06/17/2012 9:27 AM Performed by: Edison Pace Pre-anesthesia Checklist: Patient identified, Emergency Drugs available, Timeout performed, Patient being monitored and Suction available Patient Re-evaluated:Patient Re-evaluated prior to inductionOxygen Delivery Method: Circle system utilized Preoxygenation: Pre-oxygenation with 100% oxygen Intubation Type: IV induction, Rapid sequence and Cricoid Pressure applied Laryngoscope Size: Mac and 3 Grade View: Grade I Tube type: Oral Tube size: 7.5 mm Number of attempts: 1 Airway Equipment and Method: Stylet Placement Confirmation: ETT inserted through vocal cords under direct vision,  positive ETCO2 and breath sounds checked- equal and bilateral Secured at: 22 cm Tube secured with: Tape Dental Injury: Teeth and Oropharynx as per pre-operative assessment

## 2012-06-17 NOTE — Transfer of Care (Signed)
Immediate Anesthesia Transfer of Care Note  Patient: Catherine Fowler  Procedure(s) Performed: Procedure(s) (LRB): HERNIA REPAIR VENTRAL ADULT (N/A) INSERTION OF MESH (N/A)  Patient Location: PACU  Anesthesia Type: General  Level of Consciousness: awake, alert , oriented and patient cooperative  Airway & Oxygen Therapy: Patient Spontanous Breathing and Patient connected to face mask oxygen  Post-op Assessment: Report given to PACU RN, Post -op Vital signs reviewed and stable and Patient moving all extremities  Post vital signs: Reviewed and stable  Complications: No apparent anesthesia complications

## 2012-06-17 NOTE — Op Note (Signed)
Patient Name:           Catherine Fowler   Date of Surgery:        06/17/2012  Pre op Diagnosis:      Incarcerated ventral incisional hernia  Post op Diagnosis:    same  Procedure:                 Exploratory laparotomy, lysis of adhesions(60 minutes), repair incarcerated ventral incisional hernia with Strattice  biologic mesh, 20 x 16 cm diameter  Surgeon:                     [name]. Derrell Lolling, M.D., FACS  Assistant:                      Darnell Level, M.D., [facility]  Operative Indications:   This is a 61 year old Caucasian female with a history of multiple bowel operations. She has a remote history of a jejunoileal bypass, cholecystectomy and tubal ligation through a midline incision many years ago. She subsequently had takedown of her jejunoileal bypass and a mini gastric bypass in height weight. She remains morbidly obese. She presented to this hospital with intradomal abscess in the subfascial location around the umbilicus about 8 months ago. That was percutaneously drained and a good klebsiella. The source of that infection was never identified but she recovered and has had no further infectious complications. She presented to the emergency room last might with a four-day history of abdominal pain and was found to have a ventral incisional hernia containing small bowel. There is no bowel compromise that she was not really obstructed and so she was admitted and prepped for surgery this morning. She is brought to the operating room for repair of her ventral incisional hernia.  Operative Findings:       The patient had a ventral hernia in the midline incision a little bit above and below little to below the umbilicus. After dissecting out all of the defects and taking all of the intestinal adhesions down the defect was approximately 10-12 cm vertically by about 5 or 6 cm transversely. We were able to close the fascia in the midline. There were extensive adhesions but no evidence of any intra-abdominal  inflammatory process. We were able to take the adhesions down and get 7 or 8 cm back from the edge of the fascia.  Procedure in Detail:          Following the induction of general endotracheal anesthesia a Foley catheter was placed. Intravenous antibiotics were given. The abdomen was prepped and draped in a sterile fashion. Surgical time out was performed. Midline incision was made excising the old scar. Dissection was carried down through subcutaneous tissue. I slowly dissected until I could identify the fascial edge and a couple of the defects. I slowly lifted the edge of the fascia up and dissected the adhesions down from the undersurface. This took about one hour to take all the adhesions down from the undersurface of the fascia. Once this was done I had a wide margin in all directions and there was no intestinal injury. I felt that I identified all of the visual and palpable defects. I measured the wound. We brought a 20 cm x 16 cm piece of Strattice biologic  mesh to the operative field. We trimmed the corners to make a more elliptical mesh. The mesh was sutured in place with numerous interrupted mattress sutures of #1 Novafil. We did this sequentially  and circumferentially until all of the sutures were placed and tied and there did not appear to be any defect in the repair by palpation. There was no bleeding. The wound was irrigated with saline and then was irrigated with antibiotic irrigating solution. The fascia was then closed in  the midline with interrupted sutures of #1 Novafil. Two 19 French Blake drains were placed in the subcutaneous tissue and brought out through separate stab incisions above, sutured to the skin with nylon sutures and connected to suction bulbs. The subcutaneous tissue was closed with interrupted sutures of 2-0 Vicryl and the skin closed with skin staples. Dry bandages and a Velcro binder were placed. The patient tolerated the procedure well and was taken to recovery room in  stable condition. Estimated blood loss 75 cc. Complications none. Counts correct.     [name]. Derrell Lolling, M.D., FACS General and Minimally Invasive Surgery Breast and Colorectal Surgery  06/17/2012 11:57 AM

## 2012-06-17 NOTE — Plan of Care (Signed)
Problem: Phase I Progression Outcomes Goal: Incision/dressings dry and intact Outcome: Progressing Post surgical dressings are clean and dry.

## 2012-06-17 NOTE — Preoperative (Signed)
Beta Blockers   Reason not to administer Beta Blockers:Not Applicable 

## 2012-06-18 ENCOUNTER — Encounter (HOSPITAL_COMMUNITY): Payer: Self-pay | Admitting: General Surgery

## 2012-06-18 LAB — BASIC METABOLIC PANEL
BUN: 9 mg/dL (ref 6–23)
GFR calc Af Amer: 48 mL/min — ABNORMAL LOW (ref >90)
GFR calc non Af Amer: 42 mL/min — ABNORMAL LOW (ref >90)
Potassium: 4.4 mEq/L (ref 3.5–5.1)
Sodium: 135 mEq/L (ref 135–145)

## 2012-06-18 LAB — GLUCOSE, CAPILLARY
Glucose-Capillary: 149 mg/dL — ABNORMAL HIGH (ref 70–99)
Glucose-Capillary: 139 mg/dL — ABNORMAL HIGH (ref 70–99)
Glucose-Capillary: 188 mg/dL — ABNORMAL HIGH (ref 70–99)

## 2012-06-18 LAB — CBC
Hemoglobin: 10.4 g/dL — ABNORMAL LOW (ref 12.0–15.0)
MCHC: 30.7 g/dL (ref 30.0–36.0)
Platelets: 329 10*3/uL (ref 150–400)

## 2012-06-18 MED ORDER — INSULIN ASPART 100 UNIT/ML ~~LOC~~ SOLN
0.0000 [IU] | Freq: Three times a day (TID) | SUBCUTANEOUS | Status: DC
  Administered 2012-06-18: 4 [IU] via SUBCUTANEOUS
  Administered 2012-06-18 (×2): 3 [IU] via SUBCUTANEOUS
  Administered 2012-06-19 (×3): 2 [IU] via SUBCUTANEOUS
  Administered 2012-06-21: 3 [IU] via SUBCUTANEOUS

## 2012-06-18 NOTE — Progress Notes (Signed)
CARE MANAGEMENT NOTE 06/18/2012  Patient:  Catherine Fowler, Catherine Fowler   Account Number:  [id number]  Date Initiated:  06/18/2012  Documentation initiated by:  DAVIS,RHONDA  Subjective/Objective Assessment:   pt with ventral hernia repair hx of difficult intubations and difficulty with s/p anesthesia     Action/Plan:   lives at home   Anticipated DC Date:  06/19/2012   Anticipated DC Plan:  HOME/SELF CARE  In-house referral  NA      DC Planning Services  NA      [facility] Choice  NA   Choice offered to / List presented to:  NA   DME arranged  NA      DME agency  NA     HH arranged  NA      HH agency  NA   Status of service:  In process, will continue to follow Medicare Important Message given?   (If response is "NO", the following Medicare IM given date fields will be blank) Date Medicare IM given:   Date Additional Medicare IM given:    Discharge Disposition:    Per UR Regulation:  Reviewed for med. necessity/level of care/duration of stay  If discussed at Long Length of Stay Meetings, dates discussed:    Comments:  [id number]/Rhonda Earlene Plater, RN, BSN, CCM: CHART REVIEWED AND UPDATED. NO DISCHARGE NEEDS PRESENT AT THIS TIME. CASE MANAGEMENT [phone]

## 2012-06-18 NOTE — Progress Notes (Signed)
1 Day Post-Op  Subjective: Patient is alert, stable, with normal mental status. Slight anxiety. Has required a lot of pain medication but pain control is good now. No difficulty breathing. She is a little bit hungry and asking for orange juice. Denies nausea.  CBGs 115 to 139 range, good control. Lab work otherwise looks good. Creatinine 1.33, was 1.26 on admission. Urine output adequate. JP drains functioning.  Objective: Vital signs in last 24 hours: Temp:  [97.9 F (36.6 C)-99.6 F (37.6 C)] 99.6 F (37.6 C) (08/07 0400) Pulse Rate:  [32-115] 97  (08/07 0500) Resp:  [9-29] 19  (08/07 0500) BP: (114-178)/(56-77) 136/64 mmHg (08/07 0500) SpO2:  [88 %-100 %] 99 % (08/07 0500) Weight:  [220 lb (99.791 kg)-240 lb 4.8 oz (109 kg)] 240 lb 4.8 oz (109 kg) (08/07 0000) Last BM Date: 06/17/12  Intake/Output from previous day: 08/06 0701 - 08/07 0700 In: 4108.3 [I.V.:4058.3; IV Piggyback:50] Out: 1165 [Urine:1040; Drains:100; Blood:25] Intake/Output this shift: Total I/O In: 1250 [I.V.:1200; IV Piggyback:50] Out: 390 [Urine:320; Drains:70]  General appearance: alert. Oriented. Mild distress from incisional pain. Cooperative. Resp: clear to auscultation bilaterally GI: abdomen is obese. Soft. Nontender laterally. Appropriate incisional tenderness. Incision clean and dry. JP drainage serosanguineous.  Lab Results:  Results for orders placed during the hospital encounter of 06/16/12 (from the past 24 hour(s))  GLUCOSE, CAPILLARY     Status: Normal   Collection Time   06/17/12  7:24 AM      Component Value Range   Glucose-Capillary 87  70 - 99 mg/dL  GLUCOSE, CAPILLARY     Status: Abnormal   Collection Time   06/17/12 12:11 PM      Component Value Range   Glucose-Capillary 110 (*) 70 - 99 mg/dL   Comment 1 Documented in Chart    MRSA PCR SCREENING     Status: Normal   Collection Time   06/17/12  1:49 PM      Component Value Range   MRSA by PCR NEGATIVE  NEGATIVE  GLUCOSE, CAPILLARY      Status: Abnormal   Collection Time   06/17/12  4:17 PM      Component Value Range   Glucose-Capillary 137 (*) 70 - 99 mg/dL  GLUCOSE, CAPILLARY     Status: Abnormal   Collection Time   06/17/12  8:06 PM      Component Value Range   Glucose-Capillary 115 (*) 70 - 99 mg/dL  GLUCOSE, CAPILLARY     Status: Abnormal   Collection Time   06/18/12 12:01 AM      Component Value Range   Glucose-Capillary 117 (*) 70 - 99 mg/dL  GLUCOSE, CAPILLARY     Status: Abnormal   Collection Time   06/18/12  3:26 AM      Component Value Range   Glucose-Capillary 139 (*) 70 - 99 mg/dL  BASIC METABOLIC PANEL     Status: Abnormal   Collection Time   06/18/12  3:38 AM      Component Value Range   Sodium 135  135 - 145 mEq/L   Potassium 4.4  3.5 - 5.1 mEq/L   Chloride 103  96 - 112 mEq/L   CO2 21  19 - 32 mEq/L   Glucose, Bld 134 (*) 70 - 99 mg/dL   BUN 9  6 - 23 mg/dL   Creatinine, Ser 3.08 (*) 0.50 - 1.10 mg/dL   Calcium 8.3 (*) 8.4 - 10.5 mg/dL   GFR calc non Af Catherine Fowler  42 (*) >90 mL/min   GFR calc Af Amer 48 (*) >90 mL/min  CBC     Status: Abnormal   Collection Time   06/18/12  3:38 AM      Component Value Range   WBC 10.2  4.0 - 10.5 K/uL   RBC 4.26  3.87 - 5.11 MIL/uL   Hemoglobin 10.4 (*) 12.0 - 15.0 g/dL   HCT 16.1 (*) 09.6 - 04.5 %   MCV 79.6  78.0 - 100.0 fL   MCH 24.4 (*) 26.0 - 34.0 pg   MCHC 30.7  30.0 - 36.0 g/dL   RDW 40.9  81.1 - 91.4 %   Platelets 329  150 - 400 K/uL     Studies/Results: @RISRSLT24 @     .  ceFAZolin (ANCEF) IV  3 g Intravenous 60 min Pre-Op  .  ceFAZolin (ANCEF) IV  1 g Intravenous Q8H  . fentaNYL      . heparin  5,000 Units Subcutaneous Once  . heparin  5,000 Units Subcutaneous Q8H  . HYDROmorphone      . insulin aspart  0-20 Units Subcutaneous Q4H  . morphine   Intravenous Q4H  . morphine      .  morphine injection  3 mg Intravenous Once  . morphine      . pantoprazole (PROTONIX) IV  40 mg Intravenous QHS  . DISCONTD:  ceFAZolin (ANCEF) IV  3 g Intravenous  Q8H  . DISCONTD: chlorhexidine  1 application Topical Once  . DISCONTD: insulin aspart  0-15 Units Subcutaneous Q4H     Assessment/Plan: s/p Procedure(s): HERNIA REPAIR VENTRAL ADULT INSERTION OF MESH  POD #1. Stable. Begin clear liquid diet. Mobilize out of bed. Discontinue Foley Transferred to MedSurg bed.  Diabetes mellitus, insulin-dependent. Good control. Continue sliding-scale for now. We start Lantus insulin once dye advanced further.  Mild elevation of creatinine. Continue to monitor.  Morbid Obesity. Increased risk for wound complications.  DVT prophylaxis-on Basile Heparin    LOS: 2 days    Ritchie Klee M. Derrell Lolling, M.D., [facility] General and Minimally invasive Surgery Breast and Colorectal Surgery Office:   [phone] Pager:   [phone]  06/18/2012  . .prob

## 2012-06-19 LAB — BASIC METABOLIC PANEL
BUN: 10 mg/dL (ref 6–23)
CO2: 20 mEq/L (ref 19–32)
Chloride: 104 mEq/L (ref 96–112)
GFR calc Af Amer: 51 mL/min — ABNORMAL LOW (ref >90)
Potassium: 4.2 mEq/L (ref 3.5–5.1)

## 2012-06-19 LAB — GLUCOSE, CAPILLARY
Glucose-Capillary: 122 mg/dL — ABNORMAL HIGH (ref 70–99)
Glucose-Capillary: 126 mg/dL — ABNORMAL HIGH (ref 70–99)
Glucose-Capillary: 146 mg/dL — ABNORMAL HIGH (ref 70–99)
Glucose-Capillary: 147 mg/dL — ABNORMAL HIGH (ref 70–99)

## 2012-06-19 LAB — CBC
HCT: 31.9 % — ABNORMAL LOW (ref 36.0–46.0)
MCV: 79.6 fL (ref 78.0–100.0)
RBC: 4.01 MIL/uL (ref 3.87–5.11)
RDW: 14.7 % (ref 11.5–15.5)
WBC: 10.5 10*3/uL (ref 4.0–10.5)

## 2012-06-19 MED ORDER — ACETAMINOPHEN 10 MG/ML IV SOLN
1000.0000 mg | Freq: Four times a day (QID) | INTRAVENOUS | Status: AC
  Administered 2012-06-19 – 2012-06-20 (×4): 1000 mg via INTRAVENOUS
  Filled 2012-06-19 (×4): qty 100

## 2012-06-19 MED ORDER — BISACODYL 10 MG RE SUPP
10.0000 mg | Freq: Two times a day (BID) | RECTAL | Status: AC
  Administered 2012-06-19 (×2): 10 mg via RECTAL
  Filled 2012-06-19 (×4): qty 1

## 2012-06-19 NOTE — Progress Notes (Signed)
General surgery attending note:  Patient interviewed and examined. I agree with assessment and treatment plan outlined by Mr. Marlyne Beards.  She's tolerating clear liquids but hasn't passed any flatus yet. Abdomen is soft and appropriately tender.  She seems to be doing well. Hopefully it ileus will resolve in the next 24 hours or so.   Catherine Fowler. Derrell Lolling, M.D., [facility] General and Minimally invasive Surgery Breast and Colorectal Surgery Office:   [phone] Pager:   [phone]

## 2012-06-19 NOTE — Progress Notes (Signed)
Patient has ambulated in the hall x1.

## 2012-06-19 NOTE — Progress Notes (Signed)
2 Days Post-Op  Subjective: Feeling OK, up in chair, ok on clear liquids, no flatus, using IS, and walking some  Objective: Vital signs in last 24 hours: Temp:  [98.1 F (36.7 C)-99.6 F (37.6 C)] 98.1 F (36.7 C) (08/08 0622) Pulse Rate:  [84-136] 84  (08/08 0622) Resp:  [15-35] 22  (08/08 0622) BP: (120-144)/(60-74) 121/74 mmHg (08/08 0622) SpO2:  [73 %-98 %] 98 % (08/08 0622) Last BM Date: 06/17/12  600 ml PO recorded, 43 ml thru drain. Tm 99.6. Labs stable.  Intake/Output from previous day: 08/07 0701 - 08/08 0700 In: 2043.8 [P.O.:600; I.V.:1443.8] Out: 1045 [Urine:1002; Drains:43] Intake/Output this shift:    General appearance: alert, cooperative and no distress Resp: clear to auscultation bilaterally GI: Up in chair, but comfortable with that, few BS, drains show clear serous drainage.  Incsion looks good.  Lab Results:   Basename 06/19/12 0434 06/18/12 0338  WBC 10.5 10.2  HGB 9.7* 10.4*  HCT 31.9* 33.9*  PLT 284 329    BMET  Basename 06/19/12 0434 06/18/12 0338  NA 137 135  K 4.2 4.4  CL 104 103  CO2 20 21  GLUCOSE 158* 134*  BUN 10 9  CREATININE 1.26* 1.33*  CALCIUM 8.4 8.3*   PT/INR No results found for this basename: LABPROT:2,INR:2 in the last 72 hours   Lab 06/16/12 1818  AST 19  ALT 16  ALKPHOS 117  BILITOT 0.1*  PROT 7.0  ALBUMIN 3.7     Lipase     Component Value Date/Time   LIPASE 34 09/23/2011 0858     Studies/Results: No results found.  Medications:    . heparin  5,000 Units Subcutaneous Q8H  . insulin aspart  0-20 Units Subcutaneous TID WC  . morphine   Intravenous Q4H  . pantoprazole (PROTONIX) IV  40 mg Intravenous QHS    Assessment/Plan Incarcerated ventral incisional hernia s/p Exploratory laparotomy, lysis of adhesions(60 minutes), repair incarcerated ventral incisional hernia with Strattice biologic mesh, 20 x 16 cm diameter Dr. Derrell Lolling 06/17/12 S/p jejunoileal bypass, and Gastric bypass with take down of  jejunoileal bypass., appendectomy, cholecystectomy Hospitalized 09/23/11 - 09/27/11 with intra-abdominal abscess. AODM Dr. Lisabeth Devoid Chronic kidney disease creatinine 1.33 now down to 1.26 UPJ stone with chronic hydronephrosis, and renal cortical thinning. Dr. Mena Goes, Urology Morbid obesity, BMI 35.6   Plan:  Continue to mobilize, IS, continue clears till she has flatus. Sliding scale insulin for now.     LOS: 3 days    Catherine Fowler 06/19/2012

## 2012-06-20 LAB — GLUCOSE, CAPILLARY
Glucose-Capillary: 117 mg/dL — ABNORMAL HIGH (ref 70–99)
Glucose-Capillary: 120 mg/dL — ABNORMAL HIGH (ref 70–99)

## 2012-06-20 MED ORDER — ACETAMINOPHEN 325 MG PO TABS: 650.0000 mg | ORAL_TABLET | Freq: Four times a day (QID) | ORAL | Status: DC | PRN

## 2012-06-20 MED ORDER — HYDROMORPHONE HCL PF 1 MG/ML IJ SOLN
0.5000 mg | INTRAMUSCULAR | Status: DC | PRN
  Administered 2012-06-20 – 2012-06-21 (×3): 1 mg via INTRAVENOUS
  Filled 2012-06-20 (×3): qty 1

## 2012-06-20 NOTE — Progress Notes (Signed)
Patient interviewed and examined. I agree with the meds and treatment plan as outlined on Mr. Marlyne Beards.  She is feeling better and looking better. Her abdomen is quite large, but reasonably soft and the wounds look good. JP drainage serous, 70 cc out/24 hours.    .I think she is beginning to get over her ileus.  We will try to advance her diet and activities over the next 2 days, possibly home Sunday.   [name]. Derrell Lolling, M.D., [facility] General and Minimally invasive Surgery Breast and Colorectal Surgery Office:   [phone] Pager:   [phone]

## 2012-06-20 NOTE — Progress Notes (Signed)
3 Days Post-Op  Subjective: Feels better hates the PCA, walking well, would like fried chicken, we have agreed on full liquids.  Says she still isn't having much flatus, but 2 BM's.  Objective: Vital signs in last 24 hours: Temp:  [97.8 F (36.6 C)-98.8 F (37.1 C)] 97.8 F (36.6 C) (08/09 0600) Pulse Rate:  [75-85] 79  (08/09 0600) Resp:  [12-22] 16  (08/09 0749) BP: (118-167)/(55-75) 167/70 mmHg (08/09 0600) SpO2:  [93 %-99 %] 98 % (08/09 0749) FiO2 (%):  [33 %-39 %] 33 % (08/09 0410) Last BM Date: 06/19/12  320 PO recorded, 1 BM recorded, afebrile, VSS, no labs, glucose in good control, Diet: clears   Intake/Output from previous day: 08/08 0701 - 08/09 0700 In: 4747.1 [P.O.:320; I.V.:4427.1] Out: 520 [Urine:450; Drains:70] Intake/Output this shift: Total I/O In: -  Out: 300 [Urine:300]  General appearance: alert, cooperative and no distress Resp: clear to auscultation bilaterally GI: soft, tender still, drainage from each drain is clear, incision looks very good. +BS, but still hypoactive. 40 ml from drain 1 and 30 ml from drain 2 both are clear. Lab Results:   Basename 06/19/12 0434 06/18/12 0338  WBC 10.5 10.2  HGB 9.7* 10.4*  HCT 31.9* 33.9*  PLT 284 329    BMET  Basename 06/19/12 0434 06/18/12 0338  NA 137 135  K 4.2 4.4  CL 104 103  CO2 20 21  GLUCOSE 158* 134*  BUN 10 9  CREATININE 1.26* 1.33*  CALCIUM 8.4 8.3*   PT/INR No results found for this basename: LABPROT:2,INR:2 in the last 72 hours   Lab 06/16/12 1818  AST 19  ALT 16  ALKPHOS 117  BILITOT 0.1*  PROT 7.0  ALBUMIN 3.7     Lipase     Component Value Date/Time   LIPASE 34 09/23/2011 0858     Studies/Results: No results found.  Medications:    . acetaminophen  1,000 mg Intravenous Q6H  . bisacodyl  10 mg Rectal BID  . heparin  5,000 Units Subcutaneous Q8H  . insulin aspart  0-20 Units Subcutaneous TID WC  . morphine   Intravenous Q4H  . pantoprazole (PROTONIX) IV  40 mg  Intravenous QHS    Assessment/Plan Incarcerated ventral incisional hernia s/p Exploratory laparotomy, lysis of adhesions(60 minutes), repair incarcerated ventral incisional hernia with Strattice biologic mesh, 20 x 16 cm diameter Dr. Derrell Lolling 06/17/12  S/p jejunoileal bypass, and Gastric bypass with take down of jejunoileal bypass., appendectomy, cholecystectomy  Hospitalized 09/23/11 - 09/27/11 with intra-abdominal abscess.  AODM Dr. Lisabeth Devoid  Chronic kidney disease creatinine 1.33 now down to 1.26  UPJ stone with chronic hydronephrosis, and renal cortical thinning. Dr. Mena Goes, Urology  Morbid obesity, BMI 35.6   Plan: full liquids, d/c PCA, mobilize more, and see how she does.  Home when diet tolerated, will discuss when to remove drains. Recheck labs in AM     LOS: 4 days    Catherine Fowler 06/20/2012

## 2012-06-21 LAB — BASIC METABOLIC PANEL
BUN: 8 mg/dL (ref 6–23)
Calcium: 8.2 mg/dL — ABNORMAL LOW (ref 8.4–10.5)
GFR calc non Af Amer: 60 mL/min — ABNORMAL LOW (ref >90)
Glucose, Bld: 135 mg/dL — ABNORMAL HIGH (ref 70–99)
Potassium: 3.8 mEq/L (ref 3.5–5.1)

## 2012-06-21 LAB — GLUCOSE, CAPILLARY
Glucose-Capillary: 106 mg/dL — ABNORMAL HIGH (ref 70–99)
Glucose-Capillary: 132 mg/dL — ABNORMAL HIGH (ref 70–99)
Glucose-Capillary: 114 mg/dL — ABNORMAL HIGH (ref 70–99)

## 2012-06-21 LAB — CBC
Hemoglobin: 8.8 g/dL — ABNORMAL LOW (ref 12.0–15.0)
MCH: 24.7 pg — ABNORMAL LOW (ref 26.0–34.0)
MCHC: 31.2 g/dL (ref 30.0–36.0)

## 2012-06-21 MED ORDER — PANTOPRAZOLE SODIUM 40 MG PO TBEC
40.0000 mg | DELAYED_RELEASE_TABLET | Freq: Every day | ORAL | Status: DC
  Administered 2012-06-21: 40 mg via ORAL
  Filled 2012-06-21 (×2): qty 1

## 2012-06-21 NOTE — Progress Notes (Signed)
Pharmacy: IV to PO Protonix  Patient has been receiving IV Protonix. Per Pharmacy and Therapeutics Committee, this patient meets criteria for auto conversion to PO Protonix:   Tolerating a diet of full liquids or better  Tolerating other medications via enteral route  No GIB  Chiyeko Ferre PharmD  [phone] 12/10/2011 8:54 AM    

## 2012-06-21 NOTE — Progress Notes (Signed)
4 Days Post-Op  Subjective: Tolerating liquid diet.  Bowels moving.  Objective: Vital signs in last 24 hours: Temp:  [98 F (36.7 C)-98.3 F (36.8 C)] 98 F (36.7 C) (08/10 0531) Pulse Rate:  [73-82] 73  (08/10 0531) Resp:  [17-18] 18  (08/10 0531) BP: (127-153)/(75-84) 127/75 mmHg (08/10 0531) SpO2:  [93 %-94 %] 93 % (08/10 0531) Last BM Date: 06/21/12  Intake/Output from previous day: 08/09 0701 - 08/10 0700 In: 1791.7 [P.O.:260; I.V.:1531.7] Out: 820 [Urine:600; Drains:20; Stool:200] Intake/Output this shift:    PE: Abd-soft, incision clean and intact, serous drain output  Lab Results:   Basename 06/21/12 0452 06/19/12 0434  WBC 5.7 10.5  HGB 8.8* 9.7*  HCT 28.2* 31.9*  PLT 296 284   BMET  Basename 06/21/12 0452 06/19/12 0434  NA 138 137  K 3.8 4.2  CL 105 104  CO2 21 20  GLUCOSE 135* 158*  BUN 8 10  CREATININE 0.98 1.26*  CALCIUM 8.2* 8.4   PT/INR No results found for this basename: LABPROT:2,INR:2 in the last 72 hours Comprehensive Metabolic Panel:    Component Value Date/Time   NA 138 06/21/2012 0452   K 3.8 06/21/2012 0452   CL 105 06/21/2012 0452   CO2 21 06/21/2012 0452   BUN 8 06/21/2012 0452   CREATININE 0.98 06/21/2012 0452   GLUCOSE 135* 06/21/2012 0452   CALCIUM 8.2* 06/21/2012 0452   AST 19 06/16/2012 1818   ALT 16 06/16/2012 1818   ALKPHOS 117 06/16/2012 1818   BILITOT 0.1* 06/16/2012 1818   PROT 7.0 06/16/2012 1818   ALBUMIN 3.7 06/16/2012 1818     Studies/Results: No results found.  Anti-infectives: Anti-infectives     Start     Dose/Rate Route Frequency Ordered Stop   06/17/12 1830   ceFAZolin (ANCEF) IVPB 1 g/50 mL premix        1 g 100 mL/hr over 30 Minutes Intravenous Every 8 hours 06/17/12 1754 06/18/12 0225   06/17/12 1700   ceFAZolin (ANCEF) 3 g in dextrose 5 % 50 mL IVPB  Status:  Discontinued        3 g 160 mL/hr over 30 Minutes Intravenous Every 8 hours 06/17/12 1339 06/17/12 1752   06/17/12 0614   ceFAZolin (ANCEF) 3 g in  dextrose 5 % 50 mL IVPB        3 g 160 mL/hr over 30 Minutes Intravenous 60 min pre-op 06/17/12 1610 06/17/12 0919          Assessment Principal Problem:  *Incisional hernia with obstruction s/p repair 06/17/12-progressing well    LOS: 5 days   Plan: Advance diet.  Possibly home tomorrow.   Catherine Fowler J 06/21/2012

## 2012-06-22 MED ORDER — OXYCODONE-ACETAMINOPHEN 5-325 MG PO TABS: 1.0000 | ORAL_TABLET | ORAL | Status: DC | PRN

## 2012-06-22 NOTE — Progress Notes (Signed)
5 Days Post-Op  Subjective: Feels good.  Ready to go home.  Objective: Vital signs in last 24 hours: Temp:  [98.4 F (36.9 C)-98.7 F (37.1 C)] 98.7 F (37.1 C) (08/11 0530) Pulse Rate:  [72-75] 75  (08/11 0530) Resp:  [17-18] 18  (08/11 0530) BP: (130-166)/(65-79) 166/72 mmHg (08/11 0530) SpO2:  [94 %-98 %] 98 % (08/11 0530) Last BM Date: 06/21/12  Intake/Output from previous day: 08/10 0701 - 08/11 0700 In: 600 [P.O.:600] Out: 680 [Urine:650; Drains:30] Intake/Output this shift: Total I/O In: 240 [P.O.:240] Out: -   PE: Abd-soft, incision clean and intact, drains removed.  Lab Results:   [facility] 06/21/12 0452  WBC 5.7  HGB 8.8*  HCT 28.2*  PLT 296   BMET  Basename 06/21/12 0452  NA 138  K 3.8  CL 105  CO2 21  GLUCOSE 135*  BUN 8  CREATININE 0.98  CALCIUM 8.2*   PT/INR No results found for this basename: LABPROT:2,INR:2 in the last 72 hours Comprehensive Metabolic Panel:    Component Value Date/Time   NA 138 06/21/2012 0452   K 3.8 06/21/2012 0452   CL 105 06/21/2012 0452   CO2 21 06/21/2012 0452   BUN 8 06/21/2012 0452   CREATININE 0.98 06/21/2012 0452   GLUCOSE 135* 06/21/2012 0452   CALCIUM 8.2* 06/21/2012 0452   AST 19 06/16/2012 1818   ALT 16 06/16/2012 1818   ALKPHOS 117 06/16/2012 1818   BILITOT 0.1* 06/16/2012 1818   PROT 7.0 06/16/2012 1818   ALBUMIN 3.7 06/16/2012 1818     Studies/Results: No results found.  Anti-infectives: Anti-infectives     Start     Dose/Rate Route Frequency Ordered Stop   06/17/12 1830   ceFAZolin (ANCEF) IVPB 1 g/50 mL premix        1 g 100 mL/hr over 30 Minutes Intravenous Every 8 hours 06/17/12 1754 06/18/12 0225   06/17/12 1700   ceFAZolin (ANCEF) 3 g in dextrose 5 % 50 mL IVPB  Status:  Discontinued        3 g 160 mL/hr over 30 Minutes Intravenous Every 8 hours 06/17/12 1339 06/17/12 1752   06/17/12 0614   ceFAZolin (ANCEF) 3 g in dextrose 5 % 50 mL IVPB        3 g 160 mL/hr over 30 Minutes Intravenous 60 min  pre-op 06/17/12 1610 06/17/12 0919          Assessment Principal Problem:  *Incisional hernia with obstruction s/p repair 06/17/12-continues to progress well; drains removed    LOS: 6 days   Plan:  Remove staples.  Discharge.  Instructions given.   Jaslen Adcox J 06/22/2012

## 2012-06-22 NOTE — Progress Notes (Signed)
Discharged pt and script given. Instructions given using teach back to daughter and patient. Able to verbalize home care instructions and when to call md

## 2012-06-23 NOTE — Care Management Note (Signed)
    Page 1 of 2   06/23/2012     12:54:17 PM   CARE MANAGEMENT NOTE 06/23/2012  Patient:  Catherine Fowler, Catherine Fowler   Account Number:  [id number]  Date Initiated:  06/18/2012  Documentation initiated by:  DAVIS,RHONDA  Subjective/Objective Assessment:   pt with ventral hernia repair hx of difficult intubations and difficulty with s/p anesthesia     Action/Plan:   lives at home   Anticipated DC Date:  06/19/2012   Anticipated DC Plan:  HOME/SELF CARE  In-house referral  NA      DC Planning Services  CM consult      PAC Choice  NA   Choice offered to / List presented to:  NA   DME arranged  NA      DME agency  NA     HH arranged  NA      HH agency  NA   Status of service:  Completed, signed off Medicare Important Message given?   (If response is "NO", the following Medicare IM given date fields will be blank) Date Medicare IM given:   Date Additional Medicare IM given:    Discharge Disposition:  HOME/SELF CARE  Per UR Regulation:  Reviewed for med. necessity/level of care/duration of stay  If discussed at Long Length of Stay Meetings, dates discussed:    Comments:  [id number]/Rhonda Earlene Plater, RN, BSN, CCM: CHART REVIEWED AND UPDATED. NO DISCHARGE NEEDS PRESENT AT THIS TIME. CASE MANAGEMENT [phone]

## 2012-07-01 ENCOUNTER — Encounter (INDEPENDENT_AMBULATORY_CARE_PROVIDER_SITE_OTHER): Payer: BC Managed Care – PPO | Admitting: General Surgery

## 2012-07-02 NOTE — Discharge Summary (Signed)
Hadyn Azer, MD, MPH, FACS Pager: [phone]  

## 2012-07-02 NOTE — Discharge Summary (Signed)
Physician Discharge Summary  Patient ID: Catherine Fowler MRN: [id number] DOB/AGE: February 20, 1951 61 y.o.  Admit date: 06/16/2012 Discharge date: 07/02/2012  Admission Diagnoses: Incarcerated ventral incisional hernia S/p jejunoileal bypass, and Gastric bypass with take down of jejunoileal bypass., appendectomy, cholecystectomy  Hospitalized 09/23/11 - 09/27/11 with intra-abdominal abscess.  AODM Dr. Lisabeth Devoid  Chronic kidney disease creatinine 1.33 now down to 1.26  UPJ stone with chronic hydronephrosis, and renal cortical thinning. Dr. Mena Goes, Urology  Morbid obesity, BMI 35.6      Discharge Diagnoses: Same Principal Problem:  *Incisional hernia with obstruction   PROCEDURES: Exploratory laparotomy, lysis of adhesions(60 minutes), repair incarcerated ventral incisional hernia with Strattice biologic mesh, 20 x 16 cm diameter 06/17/12 Dr. Claud Kelp.   Hospital Course: She developed abdominal pain that was very focal in the periumbilical region 4 days ago that has persisted. No nausea or vomiting. She is tolerating a diet. Her bowels are moving normally. No fever or chills. She presented to the emergency department for evaluation. A CT scan demonstrated that she had a ventral hernia with some small intestine in it. The reading remarked that it may be incarcerated with a partial small bowel obstruction, But clinically she has no signs of a small bowel obstruction. She had some morphine for pain and currently she feels fine. She was admitted by Dr. Abbey Chatters, and seen by Dr. Derrell Lolling the following AM. She was taken to the OR that morning for repair of her hernia.  Her diet was slowly advanced as she improved, she was mobilized and by 06/22/12 she was seen by Dr. Abbey Chatters and was ready for discharge. Follow up:  2 weeks Dr. Derrell Lolling  Condition on D/C:  Improved  Disposition: 01-Home or Self Care  Discharge Orders    Future Appointments: Provider: Department: Dept Phone: Center:   07/03/2012  12:00 PM Ernestene Mention, MD [facility] [phone] None     Medication List  As of 07/02/2012 10:27 AM   TAKE these medications         insulin glargine 100 UNIT/ML injection   Commonly known as: LANTUS   Inject 20-72 Units into the skin 2 (two) times daily. Pt takes 72 units at night and 20units in the am      oxyCODONE-acetaminophen 5-325 MG per tablet   Commonly known as: PERCOCET/ROXICET   Take 1-2 tablets by mouth every 4 (four) hours as needed.           Follow-up Information    Follow up with Ernestene Mention, MD. Schedule an appointment as soon as possible for a visit in 2 weeks.   Contact information:   [address] [place] [address] [phone]       Follow up with Dorisann Frames, MD. (Call for follow up and help if your glucose is not  well controlled.)    Contact information:   [address] [place] [address] [phone]          Signed: Sherrie George 07/02/2012, 10:27 AM

## 2012-07-03 ENCOUNTER — Ambulatory Visit (INDEPENDENT_AMBULATORY_CARE_PROVIDER_SITE_OTHER): Payer: BC Managed Care – PPO | Admitting: General Surgery

## 2012-07-03 ENCOUNTER — Encounter (INDEPENDENT_AMBULATORY_CARE_PROVIDER_SITE_OTHER): Payer: Self-pay | Admitting: General Surgery

## 2012-07-03 VITALS — BP 140/70 | HR 72 | Temp 97.8°F | Resp 16 | Ht 66.5 in | Wt 212.1 lb

## 2012-07-03 DIAGNOSIS — K430 Incisional hernia with obstruction, without gangrene: Secondary | ICD-10-CM

## 2012-07-03 NOTE — Patient Instructions (Signed)
Your abdominal incision and hernia repair appear to be healing without any obvious complications.  You may walk an unlimited amount, and I encouraged that. You may drive your car. You may walk up and down stairs. You may walk on a treadmill, but no faster than 2 miles an hour.  No sports or lifting more than 20 pounds until after September 21.  Return to see Dr. Derrell Lolling in 2 months.

## 2012-07-03 NOTE — Progress Notes (Signed)
Patient ID: Catherine Fowler, female   DOB: [birth year], 61 y.o.   MRN: [id number]  History: This patient presented to [facility] emergently with an incarcerated ventral incisional hernia. She had a history of multiple bowel operations including jejunoileal bypass, cholecystectomy, takedown of jejunoileal bypass, mini-gastric bypass. She is diabetic. She had a prior history of a Klebsiella abdominal abscess which was percutaneously drained. This resolved without further problems, although the source was never identified. On 06/17/2012 she was taken to the operating room. She was explored through a long midline incision. She had an incarcerated ventral hernia in the midline with multiple defects. Lysis of adhesions required 60 minutes. There was a 12 cm hernia defect. We repaired this with inlay Strattice  biologic mesh, 20 x 16 cm diameter and we were able to close the fascia on the top of the mesh. She was discharged on postop day 5. Since  discharge she has resumed diet and sedentary activities. She is ambulatory but has not driven her car yet.Marland Kitchen Appetite normal. Bowel function normal.  Exam: Patient looks well. Spirits seem better. In no distress. Abdomen obese. Soft. Minimally tender. Midline incision healing normally. No skin necrosis. No erythema. No drainage. No palpable fluid collections. Hernia repair appears intact.  Assessment: Incarcerated ventral incisional hernia, recovering uneventfully in the early postop period following inlay repair with biologic mesh. History of multiple abdominal operations Morbid obesity Diabetes mellitus  Plan: Diet and activities discussed. No sports or lifting more than 20 pounds until after September 21. I told her she could drive her car and walk on her treadmill at a slow speed. She will return to see me in 2 months to make sure she is doing okay. I discussed her comorbidities and their influence on hernia formation and recurrence risk.   Angelia Fowler.  Derrell Lolling, M.D., [facility] General and Minimally invasive Surgery Breast and Colorectal Surgery Office:   [phone] Pager:   [phone]

## 2012-08-28 ENCOUNTER — Encounter (INDEPENDENT_AMBULATORY_CARE_PROVIDER_SITE_OTHER): Payer: BC Managed Care – PPO | Admitting: General Surgery

## 2013-05-01 ENCOUNTER — Telehealth: Payer: Self-pay | Admitting: Emergency Medicine

## 2013-05-01 ENCOUNTER — Ambulatory Visit: Payer: No Typology Code available for payment source | Attending: Family Medicine | Admitting: Internal Medicine

## 2013-05-01 VITALS — BP 180/72 | HR 89 | Temp 98.4°F | Resp 16 | Ht 66.0 in | Wt 217.4 lb

## 2013-05-01 DIAGNOSIS — E1165 Type 2 diabetes mellitus with hyperglycemia: Secondary | ICD-10-CM

## 2013-05-01 DIAGNOSIS — E119 Type 2 diabetes mellitus without complications: Secondary | ICD-10-CM

## 2013-05-01 DIAGNOSIS — I10 Essential (primary) hypertension: Secondary | ICD-10-CM

## 2013-05-01 MED ORDER — LISINOPRIL-HYDROCHLOROTHIAZIDE 20-12.5 MG PO TABS: 1.0000 | ORAL_TABLET | Freq: Every day | ORAL | Status: DC

## 2013-05-01 MED ORDER — INSULIN GLARGINE 100 UNIT/ML SOLOSTAR PEN: 70.0000 [IU] | PEN_INJECTOR | Freq: Every day | SUBCUTANEOUS | Status: DC

## 2013-05-01 MED ORDER — INSULIN GLARGINE 100 UNIT/ML ~~LOC~~ SOLN: 20.0000 [IU] | Freq: Two times a day (BID) | SUBCUTANEOUS | Status: DC

## 2013-05-01 NOTE — Progress Notes (Signed)
Patient ID: Catherine Fowler, female   DOB: 20-Aug-1951, 62 y.o.   MRN: [id number]  CC: Establishing care  HPI: 62 year old female with past medical history of diabetes who presented to the clinic to establish her care. Patient reports running out of the insulin and needs to have a primary care provider who will prescribe this for her. She reports taking Lantus 70 units at bedtime. She does not have a log of her blood sugar measurements but reports usual numbers are 150 - 250. No complaints of chest pain, shortness of breath or palpitations. She has no complaints of blurry vision or lightheadedness or headaches. Patient reports occasional increased feeling of thirst but at this time she has no such symptoms.  No Known Allergies Past Medical History  Diagnosis Date  . Diabetes mellitus   . Chronic kidney disease    No current outpatient prescriptions on file prior to visit.   No current facility-administered medications on file prior to visit.   Family History  Problem Relation Age of Onset  . Diabetes type II Father   . Diabetes type II Sister   . Diabetes type II Other   . Diabetes type II Maternal Aunt    History   Social History  . Marital Status: Widowed    Spouse Name: N/A    Number of Children: N/A  . Years of Education: N/A   Occupational History  . Not on file.   Social History Main Topics  . Smoking status: Never Smoker   . Smokeless tobacco: Never Used  . Alcohol Use: No  . Drug Use: No  . Sexually Active: No   Other Topics Concern  . Not on file   Social History Narrative  . No narrative on file    Review of Systems  Constitutional: Negative for fever, chills, diaphoresis, activity change, appetite change and fatigue.  HENT: Negative for ear pain, nosebleeds, congestion, facial swelling, rhinorrhea, neck pain, neck stiffness and ear discharge.   Eyes: Negative for pain, discharge, redness, itching and visual disturbance.  Respiratory: Negative for cough,  choking, chest tightness, shortness of breath, wheezing and stridor.   Cardiovascular: Negative for chest pain, palpitations and leg swelling.  Gastrointestinal: Negative for abdominal distention.  Genitourinary: Negative for dysuria, urgency, frequency, hematuria, flank pain, decreased urine volume, difficulty urinating and dyspareunia.  Musculoskeletal: Negative for back pain, joint swelling, arthralgias and gait problem.  Neurological: Negative for dizziness, tremors, seizures, syncope, facial asymmetry, speech difficulty, weakness, light-headedness, numbness and headaches.  Hematological: Negative for adenopathy. Does not bruise/bleed easily.  Psychiatric/Behavioral: Negative for hallucinations, behavioral problems, confusion, dysphoric mood, decreased concentration and agitation.    Objective:   Filed Vitals:   05/01/13 1309  BP: 180/72  Pulse: 89  Temp: 98.4 F (36.9 C)  Resp: 16    Physical Exam  Constitutional: Appears well-developed and well-nourished. No distress.  HENT: Normocephalic. External right and left ear normal. Oropharynx is clear and moist.  Eyes: Conjunctivae and EOM are normal. PERRLA, no scleral icterus.  Neck: Normal ROM. Neck supple. No JVD. No tracheal deviation. No thyromegaly.  CVS: RRR, S1/S2 +, no murmurs, no gallops, no carotid bruit.  Pulmonary: Effort and breath sounds normal, no stridor, rhonchi, wheezes, rales.  Abdominal: Soft. BS +,  no distension, tenderness, rebound or guarding.  Musculoskeletal: Normal range of motion. No edema and no tenderness.  Lymphadenopathy: No lymphadenopathy noted, cervical, inguinal. Neuro: Alert. Normal reflexes, muscle tone coordination. No cranial nerve deficit. Skin: Skin is warm and dry.  No rash noted. Not diaphoretic. No erythema. No pallor.  Psychiatric: Normal mood and affect. Behavior, judgment, thought content normal.   Lab Results  Component Value Date   WBC 5.7 06/21/2012   HGB 8.8* 06/21/2012   HCT  28.2* 06/21/2012   MCV 79.2 06/21/2012   PLT 296 06/21/2012   Lab Results  Component Value Date   CREATININE 0.98 06/21/2012   BUN 8 06/21/2012   NA 138 06/21/2012   K 3.8 06/21/2012   CL 105 06/21/2012   CO2 21 06/21/2012    Lab Results  Component Value Date   HGBA1C 9.4* 06/17/2012   Lipid Panel     Component Value Date/Time   CHOL 151 09/24/2011 0523   TRIG 128 09/24/2011 0523   HDL 26* 09/24/2011 0523   CHOLHDL 5.8 09/24/2011 0523   VLDL 26 09/24/2011 0523   LDLCALC 99 09/24/2011 0523       Assessment and plan:   Patient Active Problem List   Diagnosis Date Noted  . DM (diabetes mellitus), type 2, uncontrolled - Patient is taking Lantus 70 units at bedtime. This was changed under medical reconciliation as previous dosage was additional 25 units in the morning.  - A1c today, lipid panel and TSH  - Check BMP today  09/24/2011  . HTN (hypertension) - Patient refuses to take blood pressure medication today. She reports she will come back in one month, recheck blood pressure and then consider taking blood pressure medication  09/24/2011

## 2013-05-01 NOTE — Telephone Encounter (Signed)
Pt A1C TODAY IS 9.2

## 2013-05-01 NOTE — Patient Instructions (Addendum)
Hypertension As your heart beats, it forces blood through your arteries. This force is your blood pressure. If the pressure is too high, it is called hypertension (HTN) or high blood pressure. HTN is dangerous because you may have it and not know it. High blood pressure may mean that your heart has to work harder to pump blood. Your arteries may be narrow or stiff. The extra work puts you at risk for heart disease, stroke, and other problems.  Blood pressure consists of two numbers, a higher number over a lower, 110/72, for example. It is stated as "110 over 72." The ideal is below 120 for the top number (systolic) and under 80 for the bottom (diastolic). Write down your blood pressure today. You should pay close attention to your blood pressure if you have certain conditions such as:  Heart failure.  Prior heart attack.  Diabetes  Chronic kidney disease.  Prior stroke.  Multiple risk factors for heart disease. To see if you have HTN, your blood pressure should be measured while you are seated with your arm held at the level of the heart. It should be measured at least twice. A one-time elevated blood pressure reading (especially in the Emergency Department) does not mean that you need treatment. There may be conditions in which the blood pressure is different between your right and left arms. It is important to see your caregiver soon for a recheck. Most people have essential hypertension which means that there is not a specific cause. This type of high blood pressure may be lowered by changing lifestyle factors such as:  Stress.  Smoking.  Lack of exercise.  Excessive weight.  Drug/tobacco/alcohol use.  Eating less salt. Most people do not have symptoms from high blood pressure until it has caused damage to the body. Effective treatment can often prevent, delay or reduce that damage. TREATMENT  When a cause has been identified, treatment for high blood pressure is directed at the  cause. There are a large number of medications to treat HTN. These fall into several categories, and your caregiver will help you select the medicines that are best for you. Medications may have side effects. You should review side effects with your caregiver. If your blood pressure stays high after you have made lifestyle changes or started on medicines,   Your medication(s) may need to be changed.  Other problems may need to be addressed.  Be certain you understand your prescriptions, and know how and when to take your medicine.  Be sure to follow up with your caregiver within the time frame advised (usually within two weeks) to have your blood pressure rechecked and to review your medications.  If you are taking more than one medicine to lower your blood pressure, make sure you know how and at what times they should be taken. Taking two medicines at the same time can result in blood pressure that is too low. SEEK IMMEDIATE MEDICAL CARE IF:  You develop a severe headache, blurred or changing vision, or confusion.  You have unusual weakness or numbness, or a faint feeling.  You have severe chest or abdominal pain, vomiting, or breathing problems. MAKE SURE YOU:   Understand these instructions.  Will watch your condition.  Will get help right away if you are not doing well or get worse. Document Released: 10/29/2005 Document Revised: 01/21/2012 Document Reviewed: 06/18/2008 ExitCare Patient Information 2014 [employer]. Blood Sugar Monitoring, Adult GLUCOSE METERS FOR SELF-MONITORING OF BLOOD GLUCOSE  It is important to   be able to correctly measure your blood sugar (glucose). You can use a blood glucose monitor (a small battery-operated device) to check your glucose level at any time. This allows you and your caregiver to monitor your diabetes and to determine how well your treatment plan is working. The process of monitoring your blood glucose with a glucose meter is called  self-monitoring of blood glucose (SMBG). When people with diabetes control their blood sugar, they have better health. To test for glucose with a typical glucose meter, place the disposable strip in the meter. Then place a small sample of blood on the "test strip." The test strip is coated with chemicals that combine with glucose in blood. The meter measures how much glucose is present. The meter displays the glucose level as a number. Several new models can record and store a number of test results. Some models can connect to personal computers to store test results or print them out.  Newer meters are often easier to use than older models. Some meters allow you to get blood from places other than your fingertip. Some new models have automatic timing, error codes, signals, or barcode readers to help with proper adjustment (calibration). Some meters have a large display screen or spoken instructions for people with visual impairments.  INSTRUCTIONS FOR USING GLUCOSE METERS  Wash your hands with soap and warm water, or clean the area with alcohol. Dry your hands completely.  Prick the side of your fingertip with a lancet (a sharp-pointed tool used by hand).  Hold the hand down and gently milk the finger until a small drop of blood appears. Catch the blood with the test strip.  Follow the instructions for inserting the test strip and using the SMBG meter. Most meters require the meter to be turned on and the test strip to be inserted before applying the blood sample.  Record the test result.  Read the instructions carefully for both the meter and the test strips that go with it. Meter instructions are found in the user manual. Keep this manual to help you solve any problems that may arise. Many meters use "error codes" when there is a problem with the meter, the test strip, or the blood sample on the strip. You will need the manual to understand these error codes and fix the problem.  New devices are  available such as laser lancets and meters that can test blood taken from "alternative sites" of the body, other than fingertips. However, you should use standard fingertip testing if your glucose changes rapidly. Also, use standard testing if:  You have eaten, exercised, or taken insulin in the past 2 hours.  You think your glucose is low.  You tend to not feel symptoms of low blood glucose (hypoglycemia).  You are ill or under stress.  Clean the meter as directed by the manufacturer.  Test the meter for accuracy as directed by the manufacturer.  Take your meter with you to your caregiver's office. This way, you can test your glucose in front of your caregiver to make sure you are using the meter correctly. Your caregiver can also take a sample of blood to test using a routine lab method. If values on the glucose meter are close to the lab results, you and your caregiver will see that your meter is working well and you are using good technique. Your caregiver will advise you about what to do if the results do not match. FREQUENCY OF TESTING  Your caregiver will   tell you how often you should check your blood glucose. This will depend on your type of diabetes, your current level of diabetes control, and your types of medicines. The following are general guidelines, but your care plan may be different. Record all your readings and the time of day you took them for review with your caregiver.   Diabetes type 1.  When you are using insulin with good diabetic control (either multiple daily injections or via a pump), you should check your glucose 4 times a day.  If your diabetes is not well controlled, you may need to monitor more frequently, including before meals and 2 hours after meals, at bedtime, and occasionally between 2 a.m. and 3 a.m.  You should always check your glucose before a dose of insulin or before changing the rate on your insulin pump.  Diabetes type 2.  Guidelines for SMBG  in diabetes type 2 are not as well defined.  If you are on insulin, follow the guidelines above.  If you are on medicines, but not insulin, and your glucose is not well controlled, you should test at least twice daily.  If you are not on insulin, and your diabetes is controlled with medicines or diet alone, you should test at least once daily, usually before breakfast.  A weekly profile will help your caregiver advise you on your care plan. The week before your visit, check your glucose before a meal and 2 hours after a meal at least daily. You may want to test before and after a different meal each day so you and your caregiver can tell how well controlled your blood sugars are throughout the course of a 24 hour period.  Gestational diabetes (diabetes during pregnancy).  Frequent testing is often necessary. Accurate timing is important.  If you are not on insulin, check your glucose 4 times a day. Check it before breakfast and 1 hour after the start of each meal.  If you are on insulin, check your glucose 6 times a day. Check it before each meal and 1 hour after the first bite of each meal.  General guidelines.  More frequent testing is required at the start of insulin treatment. Your caregiver will instruct you.  Test your glucose any time you suspect you have low blood sugar (hypoglycemia).  You should test more often when you change medicines, when you have unusual stress or illness, or in other unusual circumstances. OTHER THINGS TO KNOW ABOUT GLUCOSE METERS  Measurement Range. Most glucose meters are able to read glucose levels over a broad range of values from as low as 0 to as high as 600 mg/dL. If you get an extremely high or low reading from your meter, you should first confirm it with another reading. Report very high or very low readings to your caregiver.  Whole Blood Glucose versus Plasma Glucose. Some older home glucose meters measure glucose in your whole blood. In a lab  or when using some newer home glucose meters, the glucose is measured in your plasma (one component of blood). The difference can be important. It is important for you and your caregiver to know whether your meter gives its results as "whole blood equivalent" or "plasma equivalent."  Display of High and Low Glucose Values. Part of learning how to operate a meter is understanding what the meter results mean. Know how high and low glucose concentrations are displayed on your meter.  Factors that Affect Glucose Meter Performance. The accuracy of your test   results depends on many factors and varies depending on the brand and type of meter. These factors include:  Low red blood cell count (anemia).  Substances in your blood (such as uric acid, vitamin C, and others).  Environmental factors (temperature, humidity, altitude).  Name-brand versus generic test strips.  Calibration. Make sure your meter is set up properly. It is a good idea to do a calibration test with a control solution recommended by the manufacturer of your meter whenever you begin using a fresh bottle of test strips. This will help verify the accuracy of your meter.  Improperly stored, expired, or defective test strips. Keep your strips in a dry place with the lid on.  Soiled meter.  Inadequate blood sample. NEW TECHNOLOGIES FOR GLUCOSE TESTING Alternative site testing Some glucose meters allow testing blood from alternative sites. These include the:  Upper arm.  Forearm.  Base of the thumb.  Thigh. Sampling blood from alternative sites may be desirable. However, it may have some limitations. Blood in the fingertips show changes in glucose levels more quickly than blood in other parts of the body. This means that alternative site test results may be different from fingertip test results, not because of the meter's ability to test accurately, but because the actual glucose concentration can be different.  Continuous Glucose  Monitoring Devices to measure your blood glucose continuously are available, and others are in development. These methods can be more expensive than self-monitoring with a glucose meter. However, it is uncertain how effective and reliable these devices are. Your caregiver will advise you if this approach makes sense for you. IF BLOOD SUGARS ARE CONTROLLED, PEOPLE WITH DIABETES REMAIN HEALTHIER.  SMBG is an important part of the treatment plan of patients with diabetes mellitus. Below are reasons for using SMBG:   It confirms that your glucose is at a specific, healthy level.  It detects hypoglycemia and severe hyperglycemia.  It allows you and your caregiver to make adjustments in response to changes in lifestyle for individuals requiring medicine.  It determines the need for starting insulin therapy in temporary diabetes that happens during pregnancy (gestational diabetes). Document Released: 11/01/2003 Document Revised: 01/21/2012 Document Reviewed: 02/22/2011 ExitCare Patient Information 2014 [employer].   

## 2013-05-01 NOTE — Progress Notes (Signed)
New pt. Here to establish medical coverage.Hx. Type 2 Diabetes. Ran out of lantus x 1week ago.Last A1C Feb.1,2014- 9

## 2013-05-06 ENCOUNTER — Telehealth: Payer: Self-pay | Admitting: Emergency Medicine

## 2013-05-06 ENCOUNTER — Encounter: Payer: Self-pay | Admitting: Emergency Medicine

## 2013-05-08 ENCOUNTER — Encounter (HOSPITAL_COMMUNITY): Payer: Self-pay | Admitting: Emergency Medicine

## 2013-05-08 ENCOUNTER — Emergency Department (HOSPITAL_COMMUNITY)
Admission: EM | Admit: 2013-05-08 | Discharge: 2013-05-09 | Disposition: A | Discharge status: Home / Self Care | Payer: No Typology Code available for payment source | Attending: Emergency Medicine | Admitting: Emergency Medicine

## 2013-05-08 ENCOUNTER — Emergency Department (HOSPITAL_COMMUNITY): Payer: No Typology Code available for payment source

## 2013-05-08 DIAGNOSIS — E669 Obesity, unspecified: Secondary | ICD-10-CM | POA: Insufficient documentation

## 2013-05-08 DIAGNOSIS — Z9884 Bariatric surgery status: Secondary | ICD-10-CM | POA: Insufficient documentation

## 2013-05-08 DIAGNOSIS — N189 Chronic kidney disease, unspecified: Secondary | ICD-10-CM | POA: Insufficient documentation

## 2013-05-08 DIAGNOSIS — E119 Type 2 diabetes mellitus without complications: Secondary | ICD-10-CM | POA: Insufficient documentation

## 2013-05-08 DIAGNOSIS — Z9089 Acquired absence of other organs: Secondary | ICD-10-CM | POA: Insufficient documentation

## 2013-05-08 DIAGNOSIS — K529 Noninfective gastroenteritis and colitis, unspecified: Secondary | ICD-10-CM

## 2013-05-08 DIAGNOSIS — K5289 Other specified noninfective gastroenteritis and colitis: Secondary | ICD-10-CM | POA: Insufficient documentation

## 2013-05-08 DIAGNOSIS — Z87442 Personal history of urinary calculi: Secondary | ICD-10-CM | POA: Insufficient documentation

## 2013-05-08 DIAGNOSIS — Z9851 Tubal ligation status: Secondary | ICD-10-CM | POA: Insufficient documentation

## 2013-05-08 DIAGNOSIS — Z794 Long term (current) use of insulin: Secondary | ICD-10-CM | POA: Insufficient documentation

## 2013-05-08 DIAGNOSIS — Z9889 Other specified postprocedural states: Secondary | ICD-10-CM | POA: Insufficient documentation

## 2013-05-08 LAB — URINALYSIS, ROUTINE W REFLEX MICROSCOPIC
Glucose, UA: 100 mg/dL — AB
Hgb urine dipstick: NEGATIVE
Ketones, ur: NEGATIVE mg/dL
Protein, ur: 30 mg/dL — AB
pH: 5 (ref 5.0–8.0)

## 2013-05-08 LAB — URINE MICROSCOPIC-ADD ON

## 2013-05-08 LAB — CBC WITH DIFFERENTIAL/PLATELET
Basophils Absolute: 0.1 10*3/uL (ref 0.0–0.1)
Basophils Relative: 1 % (ref 0–1)
HCT: 31 % — ABNORMAL LOW (ref 36.0–46.0)
Hemoglobin: 8.7 g/dL — ABNORMAL LOW (ref 12.0–15.0)
Lymphocytes Relative: 23 % (ref 12–46)
Lymphs Abs: 2 10*3/uL (ref 0.7–4.0)
MCV: 66.5 fL — ABNORMAL LOW (ref 78.0–100.0)
Monocytes Relative: 8 % (ref 3–12)
Neutro Abs: 5.9 10*3/uL (ref 1.7–7.7)
RDW: 18.1 % — ABNORMAL HIGH (ref 11.5–15.5)
WBC: 8.9 10*3/uL (ref 4.0–10.5)

## 2013-05-08 LAB — BASIC METABOLIC PANEL
BUN: 12 mg/dL (ref 6–23)
CO2: 26 mEq/L (ref 19–32)
Chloride: 102 mEq/L (ref 96–112)
GFR calc non Af Amer: 52 mL/min — ABNORMAL LOW (ref >90)
Glucose, Bld: 122 mg/dL — ABNORMAL HIGH (ref 70–99)
Potassium: 4.1 mEq/L (ref 3.5–5.1)
Sodium: 138 mEq/L (ref 135–145)

## 2013-05-08 MED ORDER — HYDROMORPHONE HCL PF 1 MG/ML IJ SOLN
1.0000 mg | Freq: Once | INTRAMUSCULAR | Status: AC
  Administered 2013-05-08: 1 mg via INTRAVENOUS
  Filled 2013-05-08: qty 1

## 2013-05-08 MED ORDER — IOHEXOL 300 MG/ML  SOLN
100.0000 mL | Freq: Once | INTRAMUSCULAR | Status: AC | PRN
  Administered 2013-05-08: 100 mL via INTRAVENOUS

## 2013-05-08 MED ORDER — IOHEXOL 300 MG/ML  SOLN
50.0000 mL | Freq: Once | INTRAMUSCULAR | Status: AC | PRN
  Administered 2013-05-08: 50 mL via ORAL

## 2013-05-08 MED ORDER — SODIUM CHLORIDE 0.9 % IV SOLN
Freq: Once | INTRAVENOUS | Status: AC
  Administered 2013-05-08: 19:00:00 via INTRAVENOUS

## 2013-05-08 NOTE — Progress Notes (Signed)
[facility] ED Cm consulted by [facility]  Staff, Stacy who spoke with pt who reports receiving an orange card after speaking with a staff member at the [employer] health department. 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  Pt provided with the orange card description sheet Cm left 2 messages for Diamantina Monks at [phone] Pending a return call

## 2013-05-08 NOTE — ED Notes (Signed)
Pt states that she has lower abd pain constant for the past week, denies any n/v.

## 2013-05-08 NOTE — ED Provider Notes (Signed)
History    CSN: [id number] Arrival date & time 05/08/13  1323  First MD Initiated Contact with Patient 05/08/13 1511     Chief Complaint  Patient presents with  . Abdominal Pain   (Consider location/radiation/quality/duration/timing/severity/associated sxs/prior Treatment) Patient is a 62 y.o. female presenting with abdominal pain. The history is provided by the patient. No language interpreter was used.  Abdominal Pain This is a new problem. The current episode started in the past 7 days. The problem occurs constantly. The problem has been gradually worsening. Associated symptoms include abdominal pain. Pertinent negatives include no chest pain, coughing, fever, headaches, nausea, rash or vomiting. Associated symptoms comments: LLQ abdominal pain for the past 5-6 days. It is constant pain that is progressively getting worse. No fever, N, V, diarrhea, bloody stool. No history of diverticulitis. She denies dysuria or worse pain with urination. No frequency or malodor. She reports a history of SBO in 2011 requiring surgery. Also reports history of "large kidney stone that made my left kidney stop working.".   Past Medical History  Diagnosis Date  . Diabetes mellitus   . Chronic kidney disease    Past Surgical History  Procedure Laterality Date  . Abdominal surgery    . Appendectomy    . Cholecystectomy    . Tubal ligation    . Breast surgery  2/97    breast reduction   . Gastric bypass  10/16/2004  . Rhinoplasty    . Ventral hernia repair  06/17/2012    Procedure: HERNIA REPAIR VENTRAL ADULT;  Surgeon: Ernestene Mention, MD;  Location: [facility] ORS;  Service: General;  Laterality: N/A;  . Hernia repair     Family History  Problem Relation Age of Onset  . Diabetes type II Father   . Diabetes type II Sister   . Diabetes type II Other   . Diabetes type II Maternal Aunt    History  Substance Use Topics  . Smoking status: Never Smoker   . Smokeless tobacco: Never Used  . Alcohol Use:  No   OB History   Grav Para Term Preterm Abortions TAB SAB Ect Mult Living                 Review of Systems  Constitutional: Negative for fever and appetite change.  Respiratory: Negative for cough and shortness of breath.   Cardiovascular: Negative for chest pain.  Gastrointestinal: Positive for abdominal pain. Negative for nausea, vomiting, constipation and blood in stool.  Genitourinary: Negative for dysuria, frequency and flank pain.  Musculoskeletal: Negative for back pain.  Skin: Negative for rash.  Neurological: Negative for headaches.    Allergies  Review of patient's allergies indicates no known allergies.  Home Medications   Current Outpatient Rx  Name  Route  Sig  Dispense  Refill  . Insulin Glargine (LANTUS SOLOSTAR) 100 UNIT/ML SOPN   Subcutaneous   Inject 70 Units into the skin at bedtime.   1 pen   3    BP 172/79  Pulse 97  Temp(Src) 97.8 F (36.6 C) (Oral)  Resp 22  SpO2 100% Physical Exam  Constitutional: She is oriented to person, place, and time. She appears well-developed and well-nourished.  HENT:  Head: Normocephalic.  Neck: Normal range of motion. Neck supple.  Cardiovascular: Normal rate and regular rhythm.   Pulmonary/Chest: Effort normal and breath sounds normal.  Abdominal: Soft. There is no rebound and no guarding.  Obese abdomen. Tender LLQ predominantly, with tenderness extending across to mid  lower abdomen and RUQ.  Musculoskeletal: Normal range of motion.  Neurological: She is alert and oriented to person, place, and time.  Skin: Skin is warm and dry. No rash noted.  Psychiatric: She has a normal mood and affect.    ED Course  Procedures (including critical care time) Labs Reviewed  URINALYSIS, ROUTINE W REFLEX MICROSCOPIC - Abnormal; Notable for the following:    APPearance CLOUDY (*)    Specific Gravity, Urine 1.031 (*)    Glucose, UA 100 (*)    Bilirubin Urine SMALL (*)    Protein, ur 30 (*)    Leukocytes, UA SMALL (*)     All other components within normal limits  CBC WITH DIFFERENTIAL - Abnormal; Notable for the following:    Hemoglobin 8.7 (*)    HCT 31.0 (*)    MCV 66.5 (*)    MCH 18.7 (*)    MCHC 28.1 (*)    RDW 18.1 (*)    All other components within normal limits  BASIC METABOLIC PANEL - Abnormal; Notable for the following:    Glucose, Bld 122 (*)    GFR calc non Af Amer 52 (*)    GFR calc Af Amer 61 (*)    All other components within normal limits  URINE MICROSCOPIC-ADD ON - Abnormal; Notable for the following:    Squamous Epithelial / LPF FEW (*)    Bacteria, UA FEW (*)    All other components within normal limits  URINE CULTURE   No results found. No diagnosis found. 1. Colitis  MDM  CT abd/pel shows thickened area of sigmoid bowel wall - DDx infection vs mass/tumor. Findings discussed with patient. Dr. Fredderick Phenix reviewed findings. Will treat with Cipro/Flagyl and stressed importance of follow up for repeat imaging for definitive diagnosis.  Pain is improved/resolved with IV dilaudid. She is allowed to sleep having gotten relief, and she is driving. Stable and comfortable on multiple rechecks.   Arnoldo Hooker, PA-C 05/09/13 0036

## 2013-05-08 NOTE — Progress Notes (Signed)
CM received a return call from [facility] at the [facility] health department who reviewed pt's health department chart and stated that lantus is not on the MAP discounted medication list.  Her recommendation is for the pt to complete the Lantus patient assistance program forms Response from [facility] leadership to also state that the orange card provides co pay for most medications but generally not lantus. Pt provided with a department of public health MAP (medication assistance program) flyer faxed to CM by Dawn. Pt provided with a a [facility] pamphlet stating pt can use the orange card at the health department pharmacy but Lantus generally is not on the list of covered medications.  [facility] liaison discussed that pt concerns with the health department staff will be assessed, addressed and assistance provided to the pt as much as possible regarding her lantus. CM provided copies of the [employer] patient assistance program and the lantus application Pt encouraged to continue to work with the health department, allow [facility] to assist and to complete the lantus application. Pt vocied understanding and appreciation of services rendered

## 2013-05-09 LAB — URINE CULTURE

## 2013-05-09 MED ORDER — OXYCODONE-ACETAMINOPHEN 5-325 MG PO TABS: 1.0000 | ORAL_TABLET | ORAL | Status: DC | PRN

## 2013-05-09 MED ORDER — FLUCONAZOLE 150 MG PO TABS: 150.0000 mg | ORAL_TABLET | Freq: Once | ORAL | Status: DC

## 2013-05-09 MED ORDER — METRONIDAZOLE 500 MG PO TABS: 500.0000 mg | ORAL_TABLET | Freq: Three times a day (TID) | ORAL | Status: DC

## 2013-05-09 MED ORDER — CIPROFLOXACIN HCL 500 MG PO TABS: 500.0000 mg | ORAL_TABLET | Freq: Two times a day (BID) | ORAL | Status: DC

## 2013-05-09 MED ORDER — ONDANSETRON HCL 4 MG PO TABS: 4.0000 mg | ORAL_TABLET | Freq: Four times a day (QID) | ORAL | Status: DC

## 2013-05-09 NOTE — ED Provider Notes (Signed)
Medical screening examination/treatment/procedure(s) were performed by non-physician practitioner and as supervising physician I was immediately available for consultation/collaboration.   Andrick Rust, MD 05/09/13 2327 

## 2013-06-19 ENCOUNTER — Ambulatory Visit: Payer: No Typology Code available for payment source

## 2013-06-24 ENCOUNTER — Telehealth: Payer: Self-pay | Admitting: Internal Medicine

## 2013-07-15 ENCOUNTER — Ambulatory Visit: Payer: No Typology Code available for payment source | Attending: Internal Medicine | Admitting: Internal Medicine

## 2013-07-15 ENCOUNTER — Encounter: Payer: Self-pay | Admitting: Internal Medicine

## 2013-07-15 VITALS — BP 146/69 | HR 93 | Temp 98.7°F | Resp 16 | Ht 66.0 in | Wt 218.0 lb

## 2013-07-15 DIAGNOSIS — D649 Anemia, unspecified: Secondary | ICD-10-CM

## 2013-07-15 DIAGNOSIS — I10 Essential (primary) hypertension: Secondary | ICD-10-CM

## 2013-07-15 DIAGNOSIS — E119 Type 2 diabetes mellitus without complications: Secondary | ICD-10-CM | POA: Insufficient documentation

## 2013-07-15 MED ORDER — FERROUS SULFATE 325 (65 FE) MG PO TBEC: 325.0000 mg | DELAYED_RELEASE_TABLET | Freq: Two times a day (BID) | ORAL | Status: DC

## 2013-07-15 MED ORDER — INSULIN GLARGINE 100 UNIT/ML SOLOSTAR PEN: 70.0000 [IU] | PEN_INJECTOR | Freq: Every day | SUBCUTANEOUS | Status: DC

## 2013-07-15 MED ORDER — LISINOPRIL 10 MG PO TABS: 10.0000 mg | ORAL_TABLET | Freq: Every day | ORAL | Status: DC

## 2013-07-15 NOTE — Addendum Note (Signed)
Addended by: Maretta Bees on: 07/15/2013 12:32 PM   Modules accepted: Orders

## 2013-07-15 NOTE — Progress Notes (Signed)
PT was here today Doctor Ghimire Referred her to a GI spec. Asap.  Pt refused and said that she was going to wait for her sister that is moving from [place] to [place] to take her to [facility].    Catherine Fowler is going to send the referral to [facility] and they will contact the pt to schedule the appointment.

## 2013-07-15 NOTE — Progress Notes (Signed)
Patient Demographics  Catherine Fowler, is a 62 y.o. female  ZOX:[id number]  [id number]:[id number]  DOB - 1951-08-02  Chief Complaint  Patient presents with  . Follow-up  . Diabetes  . Medication Refill        Subjective:   Catherine Fowler today is here for a follow up visit.She was recently seen a few months ago in the ED for abdominal pain. She was found to have sigmoid thickening and given cipro/flagyl.  Patient has No headache, No chest pain, No abdominal pain - No Nausea, No new weakness tingling or numbness, No Cough - SOB.   Objective:    Filed Vitals:   07/15/13 1137  BP: 146/69  Pulse: 93  Temp: 98.7 F (37.1 C)  TempSrc: Oral  Resp: 16  Height: 5\' 6"  (1.676 m)  Weight: 218 lb (98.884 kg)  SpO2: 99%     ALLERGIES:  No Known Allergies  PAST MEDICAL HISTORY: Past Medical History  Diagnosis Date  . Diabetes mellitus   . Chronic kidney disease     MEDICATIONS AT HOME: Prior to Admission medications   Medication Sig Start Date End Date Taking? Authorizing Provider  Insulin Glargine (LANTUS SOLOSTAR) 100 UNIT/ML SOPN Inject 70 Units into the skin at bedtime. 07/15/13  Yes Pho[place]ia Pirie Levora Dredge, MD  ferrous sulfate 325 (65 FE) MG EC tablet Take 1 tablet (325 mg total) by mouth 2 (two) times daily with a meal. 07/15/13   Reha Martinovich Levora Dredge, MD  lisinopril (PRINIVIL,ZESTRIL) 10 MG tablet Take 1 tablet (10 mg total) by mouth daily. 07/15/13   Cherylyn Sundby Levora Dredge, MD  ondansetron (ZOFRAN) 4 MG tablet Take 1 tablet (4 mg total) by mouth every 6 (six) hours. 05/09/13   Shari A Upstill, PA-C  oxyCODONE-acetaminophen (PERCOCET/ROXICET) 5-325 MG per tablet Take 1-2 tablets by mouth every 4 (four) hours as needed for pain. 05/09/13   Arnoldo Hooker, PA-C     Exam  General appearance :Awake, alert, not in any distress. Speech Clear. Not toxic Looking HEENT: Atraumatic and Normocephalic, pupils equally reactive to light and accomodation Neck: supple, no JVD. No cervical lymphadenopathy.   Chest:Good air entry bilaterally, no added sounds  CVS: S1 S2 regular, no murmurs.  Abdomen: Bowel sounds present, Non tender and not distended with no gaurding, rigidity or rebound. Extremities: B/L Lower Ext shows no edema, both legs are warm to touch Neurology: Awake alert, and oriented X 3, CN II-XII intact, Non focal Skin:No Rash Wounds:N/A    Data Review   CBC No results found for this basename: WBC, HGB, HCT, PLT, MCV, MCH, MCHC, RDW, NEUTRABS, LYMPHSABS, MONOABS, EOSABS, BASOSABS, BANDABS, BANDSABD,  in the last 168 hours  Chemistries   No results found for this basename: NA, K, CL, CO2, GLUCOSE, BUN, CREATININE, GFRCGP, CALCIUM, MG, AST, ALT, ALKPHOS, BILITOT,  in the last 168 hours ------------------------------------------------------------------------------------------------------------------  Recent Labs  07/15/13 1151  HGBA1C 8.4   ------------------------------------------------------------------------------------------------------------------ No results found for this basename: CHOL, HDL, LDLCALC, TRIG, CHOLHDL, LDLDIRECT,  in the last 72 hours ------------------------------------------------------------------------------------------------------------------ No results found for this basename: TSH, T4TOTAL, FREET3, T3FREE, THYROIDAB,  in the last 72 hours ------------------------------------------------------------------------------------------------------------------ No results found for this basename: VITAMINB12, FOLATE, FERRITIN, TIBC, IRON, RETICCTPCT,  in the last 72 hours  Coagulation profile  No results found for this basename: INR, PROTIME,  in the last 168 hours    Assessment & Plan  DM -c/w Lantus 70 units qhs  HTN -refused anti-hypertensives last visit-willing to start today-start Lisinopril 10 mg daily -  follow next visit  Anemia -microcytic -suspect this may be from patient being s/p gastric bypass-however needs a colonoscopy-especially  with sigmoid thickening seen on CT Abdomen in June 2014 -check FOBT, anemia panel and start Fe supplementation -referred urgently to GI for a colonoscopy (never had one so far)  Hx of Obesity -s/p gastric bypass 5 years back  Health Maintenance -Colonoscopy:-referred to GI -Pap Smear:referred to GYN clinic -Mammogram:Will order  Follow up in 1 month-please follow anemia panel, TSH and FOBT. She needs to see GI for a colonoscopy!!   The patient was given clear instructions to go to ER or return to medical center if symptoms don't improve, worsen or new problems develop. The patient verbalized understanding. The patient was told to call to get lab results if they haven't heard anything in the next week.

## 2013-07-15 NOTE — Progress Notes (Signed)
Pt here for 3 mnth f/u Diabetes with insulin coverage. Taking 70 units Lantus @ bedtime Need refill on pens more than 3 refills.[facility] Difucan script changed for cheaper med A1C,CBG drawn

## 2013-07-16 LAB — ANEMIA PANEL
ABS Retic: 59.5 10*3/uL (ref 19.0–186.0)
Folate: 11.9 ng/mL
Iron: <10 ug/dL — ABNORMAL LOW (ref 42–145)
RBC.: 4.58 MIL/uL (ref 3.87–5.11)
Retic Ct Pct: 1.3 % (ref 0.4–2.3)
UIBC: 428 ug/dL — ABNORMAL HIGH (ref 125–400)

## 2013-07-17 ENCOUNTER — Telehealth: Payer: Self-pay

## 2013-07-17 NOTE — Telephone Encounter (Signed)
Patient dropped off stool sample today to be tested Sample tested positive for blood

## 2013-07-29 ENCOUNTER — Ambulatory Visit: Payer: No Typology Code available for payment source | Attending: Internal Medicine | Admitting: Internal Medicine

## 2013-07-29 ENCOUNTER — Encounter: Payer: Self-pay | Admitting: Internal Medicine

## 2013-07-29 VITALS — BP 119/73 | HR 98 | Temp 97.2°F | Resp 16

## 2013-07-29 DIAGNOSIS — K922 Gastrointestinal hemorrhage, unspecified: Secondary | ICD-10-CM | POA: Insufficient documentation

## 2013-07-29 DIAGNOSIS — Z0000 Encounter for general adult medical examination without abnormal findings: Secondary | ICD-10-CM

## 2013-07-29 DIAGNOSIS — IMO0001 Reserved for inherently not codable concepts without codable children: Secondary | ICD-10-CM

## 2013-07-29 DIAGNOSIS — Z09 Encounter for follow-up examination after completed treatment for conditions other than malignant neoplasm: Secondary | ICD-10-CM | POA: Insufficient documentation

## 2013-07-29 DIAGNOSIS — D509 Iron deficiency anemia, unspecified: Secondary | ICD-10-CM

## 2013-07-29 DIAGNOSIS — Z79899 Other long term (current) drug therapy: Secondary | ICD-10-CM | POA: Insufficient documentation

## 2013-07-29 DIAGNOSIS — Z23 Encounter for immunization: Secondary | ICD-10-CM

## 2013-07-29 DIAGNOSIS — I10 Essential (primary) hypertension: Secondary | ICD-10-CM

## 2013-07-29 DIAGNOSIS — Z794 Long term (current) use of insulin: Secondary | ICD-10-CM | POA: Insufficient documentation

## 2013-07-29 DIAGNOSIS — R195 Other fecal abnormalities: Secondary | ICD-10-CM

## 2013-07-29 MED ORDER — INSULIN GLARGINE 100 UNIT/ML SOLOSTAR PEN: 75.0000 [IU] | PEN_INJECTOR | Freq: Every day | SUBCUTANEOUS | Status: DC

## 2013-07-29 NOTE — Progress Notes (Signed)
Patient ID: Catherine Fowler, female   DOB: Feb 12, 1951, 62 y.o.   MRN: [id number]   CC:  No specific complaints, here for follow up.  HPI: Catherine Fowler is a 62 year old woman with a past medical history of diabetes and chronic kidney disease who was last seen in the clinic on 07/15/2013. During that visit, she was noted to be taking Lantus 70 units at bedtime. Her hemoglobin A1c and CBG were taken. Hemoglobin A1c was 8.4%. She was advised to continue with her Lantus 70 units at bedtime. She was started on lisinopril for treatment of hypertension. She also was noted to have a microcytic anemia with recommendations for fecal occult blood testing. Anemia panel was checked and iron supplementation started with referral to GI for routine colonoscopy. TSH was normal at 0.920. Anemia panel showed iron less than 10, UIBC 428, TIBC pending, ferritin 2, and serum folate of 11.9. B12 was 492. These findings are certainly consistent with iron deficiency anemia. Her fecal occult blood testing was positive. She is awaiting gastroenterology referral.  No Known Allergies Past Medical History  Diagnosis Date  . Diabetes mellitus   . Chronic kidney disease    Current Outpatient Prescriptions on File Prior to Visit  Medication Sig Dispense Refill  . ferrous sulfate 325 (65 FE) MG EC tablet Take 1 tablet (325 mg total) by mouth 2 (two) times daily with a meal.  60 tablet  3  . Insulin Glargine (LANTUS SOLOSTAR) 100 UNIT/ML SOPN Inject 70 Units into the skin at bedtime.  1 pen  5  . lisinopril (PRINIVIL,ZESTRIL) 10 MG tablet Take 1 tablet (10 mg total) by mouth daily.  30 tablet  3  . ondansetron (ZOFRAN) 4 MG tablet Take 1 tablet (4 mg total) by mouth every 6 (six) hours.  12 tablet  0  . oxyCODONE-acetaminophen (PERCOCET/ROXICET) 5-325 MG per tablet Take 1-2 tablets by mouth every 4 (four) hours as needed for pain.  15 tablet  0   No current facility-administered medications on file prior to visit.   Family History   Problem Relation Age of Onset  . Diabetes type II Father   . Diabetes type II Sister   . Diabetes type II Other   . Diabetes type II Maternal Aunt    History   Social History  . Marital Status: Widowed    Spouse Name: N/A    Number of Children: N/A  . Years of Education: N/A   Occupational History  . Not on file.   Social History Main Topics  . Smoking status: Never Smoker   . Smokeless tobacco: Never Used  . Alcohol Use: No  . Drug Use: No  . Sexual Activity: No   Other Topics Concern  . Not on file   Social History Narrative  . No narrative on file    Review of Systems: Constitutional: No fever, no chills;  Appetite normal; No weight loss. HEENT: No blurry vision, no diplopia, no pharyngitis, no dysphagia CV: No chest pain, no palpitations.  Resp: No SOB, no cough. GI: No N/V, no diarrhea, no melena, no hematochezia.  GU: No dysuria, hematuria, no frequency, no hesitancy.  MSK: no myalgias/arthralgias.  Neuro:  No headache, no focal neurological deficits.  Psych: No depression, no anxiety.  Endo: No heat intolerance, no cold intolerance, no excessive thirst, no excessive urination.  Skin: No rashes, no skin lesions.  Heme: No fatigue, no easy bruising   Objective:   Filed Vitals:   07/29/13 1158  BP: 119/73  Pulse: 98  Temp: 97.2 F (36.2 C)  Resp: 16    Physical Exam  Constitutional: Appears well-developed and well-nourished. No distress.  HENT: Normocephalic. External right and left ear normal. Oropharynx is clear and moist.  Eyes: Conjunctivae and EOM are normal. PERRLA, no scleral icterus.  Neck: Normal ROM. Neck supple. No JVD. No tracheal deviation. No thyromegaly.  CVS: RRR, S1/S2 +, no murmurs, no gallops, no carotid bruit.  Pulmonary: Effort and breath sounds normal, no stridor, rhonchi, wheezes, rales.  Abdominal: Soft. BS +,  no distension, tenderness, rebound or guarding. Musculoskeletal: Normal range of motion. No edema and no tenderness.   Neuro: Alert. Normal reflexes, muscle tone coordination. No cranial nerve deficit. Skin: Skin is warm and dry. No rash noted. Not diaphoretic. No erythema. No pallor.  Extremities: Feet without lesions. Mild callusing to the bottoms of bilaterally. No diabetic ulcers. Sensation intact.  Psychiatric: Normal mood and affect. Behavior, judgment, thought content normal.   Lab Results  Component Value Date   WBC 8.9 05/08/2013   HGB 8.7* 05/08/2013   HCT 31.0* 05/08/2013   MCV 66.5* 05/08/2013   PLT 385 05/08/2013   Lab Results  Component Value Date   CREATININE 1.09 05/08/2013   BUN 12 05/08/2013   NA 138 05/08/2013   K 4.1 05/08/2013   CL 102 05/08/2013   CO2 26 05/08/2013    Lab Results  Component Value Date   HGBA1C 8.4 07/15/2013   Lipid Panel     Component Value Date/Time   CHOL 151 09/24/2011 0523   TRIG 128 09/24/2011 0523   HDL 26* 09/24/2011 0523   CHOLHDL 5.8 09/24/2011 0523   VLDL 26 09/24/2011 0523   LDLCALC 99 09/24/2011 0523       Assessment and plan:  1. Occult GI bleeding: Was referred to gastroenterology during last visit. Followup with schedule her to ensure followup will occur soon. 2. Iron deficiency anemia: Continue iron supplementation. 3. Uncontrolled type 2 diabetes: Increase Lantus to 75 units. 4. Hypertension: Controlled on lisinopril. 5. Routine health maintenance: Flu shot today. Schedule for lipid panel.   Routine Health Maintenance   Ophthalmology Exam: Up-to-date 3/14.  Colon Cancer Screening annually 50-75 with stool cards/Colonoscopy Q 10 years: Scheduling.  Lipid Screening Q 5 years: Scheduled.  Mammogram annually in women > 40: Up-to-date 3/14.  PAP annually 21-30, Q 3 years > 30: Had 2 years ago. Recommend Pap in next year.  Diabetic foot exam: 07/29/2013.  Flu vaccine: 07/29/2013.  Return to the clinic: 3 months.  Signed:  Dr. Trula Ore Ellin Fitzgibbons 07/29/2013 12:14 PM

## 2013-07-29 NOTE — Progress Notes (Signed)
Patient here for follow up on her Blood pressure Would like a flu vaccine

## 2013-07-29 NOTE — Patient Instructions (Signed)

## 2013-08-04 NOTE — Telephone Encounter (Signed)
Pt was seen on 07/15/13.

## 2013-08-14 ENCOUNTER — Ambulatory Visit: Payer: No Typology Code available for payment source | Attending: Internal Medicine | Admitting: Internal Medicine

## 2013-08-14 ENCOUNTER — Encounter: Payer: Self-pay | Admitting: Internal Medicine

## 2013-08-14 VITALS — BP 100/59 | HR 82 | Temp 98.4°F | Resp 18 | Wt 216.0 lb

## 2013-08-14 DIAGNOSIS — Z23 Encounter for immunization: Secondary | ICD-10-CM

## 2013-08-14 DIAGNOSIS — D509 Iron deficiency anemia, unspecified: Secondary | ICD-10-CM | POA: Insufficient documentation

## 2013-08-14 DIAGNOSIS — E119 Type 2 diabetes mellitus without complications: Secondary | ICD-10-CM | POA: Insufficient documentation

## 2013-08-14 DIAGNOSIS — Z85038 Personal history of other malignant neoplasm of large intestine: Secondary | ICD-10-CM

## 2013-08-14 LAB — GLUCOSE, POCT (MANUAL RESULT ENTRY): POC Glucose: 202 mg/dl — AB (ref 70–99)

## 2013-08-14 MED ORDER — FREESTYLE SYSTEM KIT: 1.0000 | PACK | Freq: Three times a day (TID) | Status: AC

## 2013-08-14 MED ORDER — LISINOPRIL 10 MG PO TABS: 10.0000 mg | ORAL_TABLET | Freq: Every day | ORAL | Status: DC

## 2013-08-14 MED ORDER — FERROUS SULFATE 325 (65 FE) MG PO TBEC: 325.0000 mg | DELAYED_RELEASE_TABLET | Freq: Two times a day (BID) | ORAL | Status: DC

## 2013-08-14 MED ORDER — INSULIN GLARGINE 100 UNIT/ML SOLOSTAR PEN: 75.0000 [IU] | PEN_INJECTOR | Freq: Every day | SUBCUTANEOUS | Status: DC

## 2013-08-14 MED ORDER — INSULIN ASPART 100 UNIT/ML ~~LOC~~ SOLN: SUBCUTANEOUS | Status: DC

## 2013-08-14 NOTE — Progress Notes (Signed)
Patient ID: Catherine Fowler, female   DOB: November 01, 1951, 62 y.o.   MRN: [id number] Patient Demographics  Catherine Fowler, is a 62 y.o. female  [id number]  [id number]  DOB - [birth year]  Chief Complaint  Patient presents with  . Follow-up        Subjective:   Catherine Fowler with History of diabetes mellitus type 2, iron deficiency anemia  is here for routine followup visit and to get her medications refilled  Denies any subjective complaints except as above, no active headache, no chest abdominal pain at this time, not short of breath. No focal weakness which is new.   Objective:    Patient Active Problem List   Diagnosis Date Noted  . Anemia, iron deficiency 07/29/2013  . Positive fecal occult blood test 07/29/2013  . DM (diabetes mellitus), type 2, uncontrolled 09/24/2011  . HTN (hypertension) 09/24/2011     Filed Vitals:   08/14/13 0924  BP: 100/59  Pulse: 82  Temp: 98.4 F (36.9 C)  TempSrc: Oral  Resp: 18  Weight: 216 lb (97.977 kg)  SpO2: 99%     Exam   Awake Alert, Oriented X 3, No new F.N deficits, Normal affect [place].AT,PERRAL Supple Neck,No JVD, No cervical lymphadenopathy appriciated.  Symmetrical Chest wall movement, Good air movement bilaterally, CTAB RRR,No Gallops,Rubs or new Murmurs, No Parasternal Heave +ve B.Sounds, Abd Soft, Non tender, No organomegaly appriciated, No rebound - guarding or rigidity. No Cyanosis, Clubbing or edema, No new Rash or bruise       Data Review   CBC No results found for this basename: WBC, HGB, HCT, PLT, MCV, MCH, MCHC, RDW, NEUTRABS, LYMPHSABS, MONOABS, EOSABS, BASOSABS, BANDABS, BANDSABD,  in the last 168 hours  Chemistries   No results found for this basename: NA, K, CL, CO2, GLUCOSE, BUN, CREATININE, GFRCGP, CALCIUM, MG, AST, ALT, ALKPHOS, BILITOT,  in the last 168 hours ------------------------------------------------------------------------------------------------------------------ No results found for  this basename: HGBA1C,  in the last 72 hours ------------------------------------------------------------------------------------------------------------------ No results found for this basename: CHOL, HDL, LDLCALC, TRIG, CHOLHDL, LDLDIRECT,  in the last 72 hours ------------------------------------------------------------------------------------------------------------------ No results found for this basename: TSH, T4TOTAL, FREET3, T3FREE, THYROIDAB,  in the last 72 hours ------------------------------------------------------------------------------------------------------------------ No results found for this basename: VITAMINB12, FOLATE, FERRITIN, TIBC, IRON, RETICCTPCT,  in the last 72 hours  Coagulation profile  No results found for this basename: INR, PROTIME,  in the last 168 hours     Prior to Admission medications   Medication Sig Start Date End Date Taking? Authorizing Provider  ferrous sulfate 325 (65 FE) MG EC tablet Take 1 tablet (325 mg total) by mouth 2 (two) times daily with a meal. 08/14/13  Yes Leroy Sea, MD  Insulin Glargine (LANTUS SOLOSTAR) 100 UNIT/ML SOPN Inject 75 Units into the skin at bedtime. 08/14/13  Yes Leroy Sea, MD  lisinopril (PRINIVIL,ZESTRIL) 10 MG tablet Take 1 tablet (10 mg total) by mouth daily. 08/14/13  Yes Leroy Sea, MD     Assessment & Plan    1. Diabetes mellitus - glycemic control is poor, she is already on 75 units of Lantus, will add NovoLog sliding scale, glucometer with testing instructions will be provided. We'll get her back in 2 months for repeat A1c along with an annual history and physical.  Lab Results  Component Value Date   HGBA1C 8.4 07/15/2013    2. Iron deficiency anemia - she was weakly Hemoccult positive last time, she is getting colonoscopy by GI at [facility] next  week. Continue oral iron supplementation.    Routine health maintenance.  She received flu shot last visit, tetanus shot will be given  today.  Is due for colonoscopy next week  Mammogram done April of this year was stable   OB referral made for Pap smear   Catherine Fowler

## 2013-08-14 NOTE — Patient Instructions (Signed)
Accuchecks 4 times/day, Once in AM empty stomach and then before each meal. Log in all results and show them to your Prim.MD next visit. If any glucose reading is under 80 or above 300 call your Prim MD immidiately. Follow Low glucose instructions for glucose under 80 as instructed.  

## 2013-08-14 NOTE — Progress Notes (Signed)
Pt is here for a f/u on HTN and needing refills on meds Has a colonoscopy appt sched for November CBG this am is 202... Had orange juice before coming in  She is alert w/no signs of acute distress

## 2013-08-16 ENCOUNTER — Emergency Department (HOSPITAL_COMMUNITY): Payer: No Typology Code available for payment source

## 2013-08-16 ENCOUNTER — Encounter (HOSPITAL_COMMUNITY): Payer: Self-pay | Admitting: Emergency Medicine

## 2013-08-16 ENCOUNTER — Emergency Department (HOSPITAL_COMMUNITY)
Admission: EM | Admit: 2013-08-16 | Discharge: 2013-08-16 | Disposition: A | Discharge status: Home / Self Care | Payer: No Typology Code available for payment source | Attending: Emergency Medicine | Admitting: Emergency Medicine

## 2013-08-16 DIAGNOSIS — Z794 Long term (current) use of insulin: Secondary | ICD-10-CM | POA: Insufficient documentation

## 2013-08-16 DIAGNOSIS — N189 Chronic kidney disease, unspecified: Secondary | ICD-10-CM | POA: Insufficient documentation

## 2013-08-16 DIAGNOSIS — E119 Type 2 diabetes mellitus without complications: Secondary | ICD-10-CM | POA: Insufficient documentation

## 2013-08-16 DIAGNOSIS — R109 Unspecified abdominal pain: Secondary | ICD-10-CM

## 2013-08-16 DIAGNOSIS — R1033 Periumbilical pain: Secondary | ICD-10-CM | POA: Insufficient documentation

## 2013-08-16 DIAGNOSIS — Z9884 Bariatric surgery status: Secondary | ICD-10-CM | POA: Insufficient documentation

## 2013-08-16 DIAGNOSIS — Z79899 Other long term (current) drug therapy: Secondary | ICD-10-CM | POA: Insufficient documentation

## 2013-08-16 LAB — URINALYSIS, ROUTINE W REFLEX MICROSCOPIC
Bilirubin Urine: NEGATIVE
Glucose, UA: NEGATIVE mg/dL
Hgb urine dipstick: NEGATIVE
Ketones, ur: NEGATIVE mg/dL
pH: 5.5 (ref 5.0–8.0)

## 2013-08-16 LAB — CBC WITH DIFFERENTIAL/PLATELET
Basophils Absolute: 0.1 10*3/uL (ref 0.0–0.1)
Eosinophils Relative: 2 % (ref 0–5)
HCT: 31.5 % — ABNORMAL LOW (ref 36.0–46.0)
Lymphocytes Relative: 28 % (ref 12–46)
MCV: 71.3 fL — ABNORMAL LOW (ref 78.0–100.0)
Monocytes Relative: 7 % (ref 3–12)
Neutro Abs: 4.6 10*3/uL (ref 1.7–7.7)
RBC: 4.42 MIL/uL (ref 3.87–5.11)
RDW: 24.8 % — ABNORMAL HIGH (ref 11.5–15.5)
WBC: 7.3 10*3/uL (ref 4.0–10.5)

## 2013-08-16 LAB — COMPREHENSIVE METABOLIC PANEL
AST: 13 U/L (ref 0–37)
Albumin: 3.4 g/dL — ABNORMAL LOW (ref 3.5–5.2)
Alkaline Phosphatase: 131 U/L — ABNORMAL HIGH (ref 39–117)
BUN: 13 mg/dL (ref 6–23)
CO2: 21 mEq/L (ref 19–32)
Chloride: 104 mEq/L (ref 96–112)
GFR calc non Af Amer: 49 mL/min — ABNORMAL LOW (ref >90)
Potassium: 4.4 mEq/L (ref 3.5–5.1)
Total Bilirubin: 0.1 mg/dL — ABNORMAL LOW (ref 0.3–1.2)

## 2013-08-16 MED ORDER — IOHEXOL 300 MG/ML  SOLN
50.0000 mL | Freq: Once | INTRAMUSCULAR | Status: AC | PRN
  Administered 2013-08-16: 50 mL via ORAL

## 2013-08-16 MED ORDER — HYDROCODONE-ACETAMINOPHEN 5-325 MG PO TABS: 1.0000 | ORAL_TABLET | Freq: Four times a day (QID) | ORAL | Status: DC | PRN

## 2013-08-16 MED ORDER — IOHEXOL 300 MG/ML  SOLN
100.0000 mL | Freq: Once | INTRAMUSCULAR | Status: AC | PRN
  Administered 2013-08-16: 100 mL via INTRAVENOUS

## 2013-08-16 MED ORDER — MORPHINE SULFATE 4 MG/ML IJ SOLN
4.0000 mg | Freq: Once | INTRAMUSCULAR | Status: AC
  Administered 2013-08-16: 4 mg via INTRAVENOUS
  Filled 2013-08-16: qty 1

## 2013-08-16 MED ORDER — ONDANSETRON HCL 4 MG/2ML IJ SOLN
4.0000 mg | Freq: Once | INTRAMUSCULAR | Status: AC
  Administered 2013-08-16: 4 mg via INTRAVENOUS
  Filled 2013-08-16: qty 2

## 2013-08-16 MED ORDER — SODIUM CHLORIDE 0.9 % IV BOLUS (SEPSIS)
1000.0000 mL | Freq: Once | INTRAVENOUS | Status: AC
  Administered 2013-08-16: 1000 mL via INTRAVENOUS

## 2013-08-16 NOTE — ED Provider Notes (Signed)
CSN: [id number]     Arrival date & time 08/16/13  9528 History   First MD Initiated Contact with Patient 08/16/13 [phone]     Chief Complaint  Patient presents with  . Abdominal Pain   (Consider location/radiation/quality/duration/timing/severity/associated sxs/prior Treatment) HPI Comments: Patient with a history of multiple abdominal surgeries including Gastric Bypass, Appendectomy, Cholecystectomy, and Ventral Hernia repair secondary to incarcerated ventral hernia presents today with periumbilical abdominal pain.  She reports that the pain has been present for two weeks.  The pain has been present for pretty much constantly, but she states that the pain did ease up for a couple of days last week.  Patient describes the pain as a "strong gut ache."  She reports that the pain does not radiate.  She states that the pain became very intense last evening, but has since improved somewhat.  She has taken Aleve for her pain without relief.  She denies nausea, vomiting, or diarrhea.  Last BM was this morning, which she reports was normal.  She denies constipation.  Denies fever or chills.  Denies urinary symptoms.  Denies vaginal discharge.    The history is provided by the patient.    Past Medical History  Diagnosis Date  . Diabetes mellitus   . Chronic kidney disease    Past Surgical History  Procedure Laterality Date  . Abdominal surgery    . Appendectomy    . Cholecystectomy    . Tubal ligation    . Breast surgery  2/97    breast reduction   . Gastric bypass  10/16/2004  . Rhinoplasty    . Ventral hernia repair  06/17/2012    Procedure: HERNIA REPAIR VENTRAL ADULT;  Surgeon: Ernestene Mention, MD;  Location: [facility]S;  Service: General;  Laterality: N/A;  . Hernia repair     Family History  Problem Relation Age of Onset  . Diabetes type II Father   . Diabetes type II Sister   . Diabetes type II Other   . Diabetes type II Maternal Aunt    History  Substance Use Topics  . Smoking status:  Never Smoker   . Smokeless tobacco: Never Used  . Alcohol Use: No   OB History   Grav Para Term Preterm Abortions TAB SAB Ect Mult Living                 Review of Systems  Gastrointestinal: Positive for abdominal pain.  All other systems reviewed and are negative.    Allergies  Review of patient's allergies indicates no known allergies.  Home Medications   Current Outpatient Rx  Name  Route  Sig  Dispense  Refill  . DiphenhydrAMINE HCl, Sleep, (ZZZQUIL PO)   Oral   Take 30 mLs by mouth daily as needed (sleep).         . ferrous sulfate 325 (65 FE) MG EC tablet   Oral   Take 1 tablet (325 mg total) by mouth 2 (two) times daily with a meal.   60 tablet   3   . glucose monitoring kit (FREESTYLE) monitoring kit   Does not apply   1 each by Does not apply route 4 (four) times daily - after meals and at bedtime. 1 month Diabetic Testing Supplies for QAC-QHS accuchecks. Any brand okay   1 each   1   . Insulin Glargine (LANTUS SOLOSTAR) 100 UNIT/ML SOPN   Subcutaneous   Inject 75 Units into the skin at bedtime.   1  pen   5   . lisinopril (PRINIVIL,ZESTRIL) 10 MG tablet   Oral   Take 1 tablet (10 mg total) by mouth daily.   30 tablet   3   . Naproxen Sodium (ALEVE PO)   Oral   Take 2 tablets by mouth daily as needed (pain).         . insulin aspart (NOVOLOG) 100 UNIT/ML injection   Subcutaneous   Inject 1,400,350 Units into the skin 3 (three) times daily before meals. Before each meal 3 times a day, 140-199 - 2 units, 200-250 - 4 units, 251-299 - 6 units,  300-349 - 8 units,  350 or above 10 units. Dispense syringes and needles as needed, Ok to switch to PEN if approved.          BP 116/95  Pulse 79  Temp(Src) 97.9 F (36.6 C) (Oral)  Resp 20  SpO2 99% Physical Exam  Nursing note and vitals reviewed. Constitutional: She appears well-developed and well-nourished.  HENT:  Head: Normocephalic and atraumatic.  Mouth/Throat: Oropharynx is clear and  moist.  Neck: Normal range of motion. Neck supple.  Cardiovascular: Normal rate, regular rhythm and normal heart sounds.   Pulmonary/Chest: Effort normal and breath sounds normal.  Abdominal: Soft. Bowel sounds are normal. She exhibits no distension and no mass. There is tenderness. There is no rebound, no guarding, no tenderness at McBurney's point and negative Murphy's sign.  Periumbilical tenderness to palpation  Musculoskeletal: Normal range of motion.  Neurological: She is alert.  Skin: Skin is warm and dry.  Psychiatric: She has a normal mood and affect.    ED Course  Procedures (including critical care time) Labs Review Labs Reviewed  CBC WITH DIFFERENTIAL - Abnormal; Notable for the following:    Hemoglobin 9.2 (*)    HCT 31.5 (*)    MCV 71.3 (*)    MCH 20.8 (*)    MCHC 29.2 (*)    RDW 24.8 (*)    All other components within normal limits  COMPREHENSIVE METABOLIC PANEL  LIPASE, BLOOD  URINALYSIS, ROUTINE W REFLEX MICROSCOPIC   Imaging Review Ct Abdomen Pelvis W Contrast  08/16/2013   CLINICAL DATA:  Abdominal pain for 2 weeks.  EXAM: CT ABDOMEN AND PELVIS WITH CONTRAST  TECHNIQUE: Multidetector CT imaging of the abdomen and pelvis was performed using the standard protocol following bolus administration of intravenous contrast.  CONTRAST:  50mL OMNIPAQUE IOHEXOL 300 MG/ML SOLN, OMNIPAQUE IOHEXOL 300 MG/ML SOLN  COMPARISON:  CT 05/08/2013 CT 06/16/2012  FINDINGS: Lung bases are clear. No pericardial fluid. No focal hepatic lesion. The gallbladder, pancreas, spleen, adrenal glands, and right kidney are normal. 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 There is chronic hydronephrosis on the left and severe renal cortical thinning and atrophy.  The stomach demonstrates postsurgical change consistent gastric bypass. No evidence of obstruction . Small bowel appears normal. Appendix not identified. Diverticular disease of the  descending colon without acute inflammation.  Abdominal aorta is normal in caliber. No retroperitoneal or periportal lymphadenopathy. Uterus and bladder normal. There is a rounded 11 mm probable lymph node along the right common iliac vessels adjacent to the gonadal vein (image 59) which is increased from 6 mm on CT of 8 /03/2012. Review of bone windows demonstrates no aggressive osseous lesions.  IMPRESSION: 1. No explanation for acute abdominal pain. 2. Gastric bypass surgery without evidence of obstruction or complication. 3. Chronic obstruction of the left kidney with  atrophy and hydronephrosis. 4. Small rounded nodule along the iliac vessels on the right is slightly enlarged compared to exam from 06/2012. In the absence of known malignancy this is likely benign finding. Consider followup CT in 6 months for re-evaluation.   Electronically Signed   By: Genevive Bi M.D.   On: 08/16/2013 12:27    MDM  No diagnosis found. Patient presents today with a chief complaint of abdominal pain that has been present for the past 2 weeks.  Patient is afebrile.  Labs unremarkable.  UA unremarkable.  However, due to patient's history CT ab/pelvis with contrast was ordered.  CT results as above showing no acute findings.  No explanation for abdominal pain.  Pain improved during ED course.  Therefore, feel that the patient is stable for discharge.  Patient instructed to follow up with GI if pain continues.  Patient also instructed to follow up with her PCP.  Return precautions given.      Pascal Lux [place], PA-C 08/16/13 1529

## 2013-08-16 NOTE — ED Provider Notes (Signed)
Medical screening examination/treatment/procedure(s) were performed by non-physician practitioner and as supervising physician I was immediately available for consultation/collaboration.  Shanna Cisco, MD 08/16/13 [phone]

## 2013-08-16 NOTE — ED Notes (Signed)
PA at bedside.

## 2013-08-16 NOTE — ED Notes (Signed)
Pt from home reports abd pain x 2weeks, denies N/V/D. Pt states that she was tx at this facility 2 mths with intestinal infection. Pt is A&O and in NAD

## 2013-08-18 ENCOUNTER — Encounter: Payer: Self-pay | Admitting: Advanced Practice Midwife

## 2013-09-03 ENCOUNTER — Telehealth: Payer: Self-pay | Admitting: Emergency Medicine

## 2013-09-03 NOTE — Telephone Encounter (Signed)
Pt requesting narcotic refill for back pain

## 2013-09-04 ENCOUNTER — Ambulatory Visit: Payer: No Typology Code available for payment source | Attending: Internal Medicine | Admitting: Internal Medicine

## 2013-09-04 ENCOUNTER — Encounter: Payer: Self-pay | Admitting: Internal Medicine

## 2013-09-04 VITALS — BP 94/55 | HR 91 | Temp 97.7°F | Resp 18 | Wt 212.0 lb

## 2013-09-04 DIAGNOSIS — D509 Iron deficiency anemia, unspecified: Secondary | ICD-10-CM

## 2013-09-04 DIAGNOSIS — E1165 Type 2 diabetes mellitus with hyperglycemia: Secondary | ICD-10-CM

## 2013-09-04 DIAGNOSIS — E119 Type 2 diabetes mellitus without complications: Secondary | ICD-10-CM | POA: Insufficient documentation

## 2013-09-04 DIAGNOSIS — Z0000 Encounter for general adult medical examination without abnormal findings: Secondary | ICD-10-CM | POA: Insufficient documentation

## 2013-09-04 DIAGNOSIS — R195 Other fecal abnormalities: Secondary | ICD-10-CM

## 2013-09-04 DIAGNOSIS — I10 Essential (primary) hypertension: Secondary | ICD-10-CM

## 2013-09-04 MED ORDER — HYDROCODONE-ACETAMINOPHEN 10-325 MG PO TABS: 1.0000 | ORAL_TABLET | ORAL | Status: DC | PRN

## 2013-09-04 NOTE — Progress Notes (Signed)
Patient ID: Catherine Fowler, female   DOB: 06/27/1951, 62 y.o.   MRN: [id number]  CC: Follow up  HPI: 62 year old female with past medical history of hypertension, diabetes, iron deficiency anemia who presented to our clinic for followup. Patient reports having constant abdominal pain which usually was in the mid abdominal area but now shifted to left lower quadrant. Patient reports regular bowel movements, no blood in the bowel movements. No constipation or diarrhea. No fevers or chills. No nausea or vomiting.   No Known Allergies Past Medical History  Diagnosis Date  . Diabetes mellitus   . Chronic kidney disease    Current Outpatient Prescriptions on File Prior to Visit  Medication Sig Dispense Refill  . ferrous sulfate 325 (65 FE) MG EC tablet Take 1 tablet (325 mg total) by mouth 2 (two) times daily with a meal.  60 tablet  3  . glucose monitoring kit (FREESTYLE) monitoring kit 1 each by Does not apply route 4 (four) times daily - after meals and at bedtime. 1 month Diabetic Testing Supplies for QAC-QHS accuchecks. Any brand okay  1 each  1  . HYDROcodone-acetaminophen (NORCO/VICODIN) 5-325 MG per tablet Take 1-2 tablets by mouth every 6 (six) hours as needed for pain.  20 tablet  0  . insulin aspart (NOVOLOG) 100 UNIT/ML injection Inject 1,400,350 Units into the skin 3 (three) times daily before meals. Before each meal 3 times a day, 140-199 - 2 units, 200-250 - 4 units, 251-299 - 6 units,  300-349 - 8 units,  350 or above 10 units. Dispense syringes and needles as needed, Ok to switch to PEN if approved.      . Insulin Glargine (LANTUS SOLOSTAR) 100 UNIT/ML SOPN Inject 75 Units into the skin at bedtime.  1 pen  5  . lisinopril (PRINIVIL,ZESTRIL) 10 MG tablet Take 1 tablet (10 mg total) by mouth daily.  30 tablet  3  . Naproxen Sodium (ALEVE PO) Take 2 tablets by mouth daily as needed (pain).      . DiphenhydrAMINE HCl, Sleep, (ZZZQUIL PO) Take 30 mLs by mouth daily as needed (sleep).        No current facility-administered medications on file prior to visit.   Family History  Problem Relation Age of Onset  . Diabetes type II Father   . Diabetes type II Sister   . Diabetes type II Other   . Diabetes type II Maternal Aunt    History   Social History  . Marital Status: Widowed    Spouse Name: N/A    Number of Children: N/A  . Years of Education: N/A   Occupational History  . Not on file.   Social History Main Topics  . Smoking status: Never Smoker   . Smokeless tobacco: Never Used  . Alcohol Use: No  . Drug Use: No  . Sexual Activity: No   Other Topics Concern  . Not on file   Social History Narrative  . No narrative on file    Review of Systems  Constitutional: Negative for fever, chills, diaphoresis, activity change, appetite change and fatigue.  HENT: Negative for ear pain, nosebleeds, congestion, facial swelling, rhinorrhea, neck pain, neck stiffness and ear discharge.   Eyes: Negative for pain, discharge, redness, itching and visual disturbance.  Respiratory: Negative for cough, choking, chest tightness, shortness of breath, wheezing and stridor.   Cardiovascular: Negative for chest pain, palpitations and leg swelling.  Gastrointestinal: Negative for abdominal distention.  positive for left lower quadrant  abdominal pain Genitourinary: Negative for dysuria, urgency, frequency, hematuria, flank pain, decreased urine volume, difficulty urinating and dyspareunia.  Musculoskeletal: Negative for back pain, joint swelling, arthralgias and gait problem.  Neurological: Negative for dizziness, tremors, seizures, syncope, facial asymmetry, speech difficulty, weakness, light-headedness, numbness and headaches.  Hematological: Negative for adenopathy. Does not bruise/bleed easily.  Psychiatric/Behavioral: Negative for hallucinations, behavioral problems, confusion, dysphoric mood, decreased concentration and agitation.    Objective:   Filed Vitals:   09/04/13  0951  BP: 94/55  Pulse: 91  Temp: 97.7 F (36.5 C)  Resp: 18    Physical Exam  Constitutional: Appears well-developed and well-nourished. No distress.  HENT: Normocephalic. External right and left ear normal. Oropharynx is clear and moist.  Eyes: Conjunctivae and EOM are normal. PERRLA, no scleral icterus.  Neck: Normal ROM. Neck supple. No JVD. No tracheal deviation. No thyromegaly.  CVS: RRR, S1/S2 +, no murmurs, no gallops, no carotid bruit.  Pulmonary: Effort and breath sounds normal, no stridor, rhonchi, wheezes, rales.  Abdominal: Soft. BS +,  no distension, tenderness, rebound or guarding.  Musculoskeletal: Normal range of motion. No edema and no tenderness.  Lymphadenopathy: No lymphadenopathy noted, cervical, inguinal. Neuro: Alert. Normal reflexes, muscle tone coordination. No cranial nerve deficit. Skin: Skin is warm and dry. No rash noted. Not diaphoretic. No erythema. No pallor.  Psychiatric: Normal mood and affect. Behavior, judgment, thought content normal.   Lab Results  Component Value Date   WBC 7.3 08/16/2013   HGB 9.2* 08/16/2013   HCT 31.5* 08/16/2013   MCV 71.3* 08/16/2013   PLT 366 08/16/2013   Lab Results  Component Value Date   CREATININE 1.16* 08/16/2013   BUN 13 08/16/2013   NA 138 08/16/2013   K 4.4 08/16/2013   CL 104 08/16/2013   CO2 21 08/16/2013    Lab Results  Component Value Date   HGBA1C 8.4 07/15/2013   Lipid Panel     Component Value Date/Time   CHOL 151 09/24/2011 0523   TRIG 128 09/24/2011 0523   HDL 26* 09/24/2011 0523   CHOLHDL 5.8 09/24/2011 0523   VLDL 26 09/24/2011 0523   LDLCALC 99 09/24/2011 0523       Assessment and plan:   Patient Active Problem List   Diagnosis Date Noted  . Preventative health care 09/04/2013    Priority: Medium - Colonoscopy scheduled for first week in November 2014  . Anemia, iron deficiency 07/29/2013    Priority: Medium - Continue ferrous sulfate supplementation   . DM (diabetes mellitus), type  2, uncontrolled 09/24/2011    Priority: Medium - Next A1c is due in December 2014  - Continue current Lantus dose and sliding scale regimen   . HTN (hypertension) 09/24/2011    Priority: Medium - We have discussed target BP range - I have advised pt to check BP regularly and to call us back if the numbers are higher than 140/90 - discussed the importance of compliance with medical therapy and diet  - Continue lisinopril

## 2013-09-04 NOTE — Patient Instructions (Signed)

## 2013-09-04 NOTE — Progress Notes (Signed)
Pt is here for a f/u... C/o abd pain around [employer] area Hx of abd surgery Recently went to the ER w/normal CT Reports pain is constant w/intermittent sharp shooting pains Pain is 5/10 at the moment Denies: f/v/n/d, constipation... Wants a strong medication to stop the pain??? Pt is alert w/no signs of acute distress... Ambulated well to exam room w/NAD

## 2013-09-04 NOTE — Telephone Encounter (Signed)
Declined

## 2013-09-04 NOTE — Addendum Note (Signed)
Addended by: Alison Murray on: 09/04/2013 10:08 AM   Modules accepted: Orders

## 2013-09-18 DIAGNOSIS — C189 Malignant neoplasm of colon, unspecified: Secondary | ICD-10-CM

## 2013-09-18 HISTORY — DX: Malignant neoplasm of colon, unspecified: C18.9

## 2013-09-21 ENCOUNTER — Telehealth: Payer: Self-pay

## 2013-09-21 NOTE — Telephone Encounter (Signed)
Left messsage on machine Need to schedule ct of chest pelvis and abd Per dr Hyman Hopes

## 2013-09-21 NOTE — Telephone Encounter (Signed)
Patient returned call- Has ct of chest abd and pelvis scheduled for this Friday At [facility]

## 2013-09-28 ENCOUNTER — Other Ambulatory Visit: Payer: Self-pay | Admitting: Emergency Medicine

## 2013-09-28 MED ORDER — INSULIN GLARGINE 100 UNIT/ML SOLOSTAR PEN: 75.0000 [IU] | PEN_INJECTOR | Freq: Every day | SUBCUTANEOUS | Status: DC

## 2013-09-29 ENCOUNTER — Telehealth: Payer: Self-pay | Admitting: Internal Medicine

## 2013-09-29 ENCOUNTER — Telehealth: Payer: Self-pay

## 2013-09-29 ENCOUNTER — Other Ambulatory Visit: Payer: Self-pay

## 2013-09-29 MED ORDER — INSULIN GLARGINE 100 UNIT/ML SOLOSTAR PEN: 75.0000 [IU] | PEN_INJECTOR | Freq: Every day | SUBCUTANEOUS | Status: DC

## 2013-09-29 NOTE — Telephone Encounter (Signed)
Catherine Fowler from health dept called about lantuus order Should be 7 pens to last a  Month Changed order to accomodate  A one month supply

## 2013-09-29 NOTE — Telephone Encounter (Signed)
Rosey Bath from [facility] calling about Lantus script because with current dosage/directions it is only enough for 4 days and would like to give pt a 1 month supply.

## 2013-09-29 NOTE — Telephone Encounter (Signed)
Medication updated and changed

## 2013-10-05 ENCOUNTER — Ambulatory Visit (INDEPENDENT_AMBULATORY_CARE_PROVIDER_SITE_OTHER): Payer: No Typology Code available for payment source | Admitting: Advanced Practice Midwife

## 2013-10-05 ENCOUNTER — Encounter: Payer: Self-pay | Admitting: Advanced Practice Midwife

## 2013-10-05 VITALS — BP 114/71 | HR 87 | Temp 98.3°F | Ht 67.0 in | Wt 208.6 lb

## 2013-10-05 DIAGNOSIS — Z01419 Encounter for gynecological examination (general) (routine) without abnormal findings: Secondary | ICD-10-CM

## 2013-10-05 MED ORDER — NYSTATIN 100000 UNIT/GM EX POWD: CUTANEOUS | Status: DC

## 2013-10-05 NOTE — Addendum Note (Signed)
Addended by: Sharen Counter A on: 10/05/2013 07:31 PM   Modules accepted: Orders

## 2013-10-05 NOTE — Progress Notes (Signed)
  Subjective:    Catherine Fowler is a 62 y.o. female who presents for an annual exam. The patient has no complaints today but reports some skin discoloration in groin areas. She was referred for Pap to [facility] and to GI for colonoscopy.  Colonoscopy recently diagnosed colon cancer and pt has surgery scheduled.  The patient is sexually active. GYN screening history: last pap: approximate date 2010 and was normal. The patient wears seatbelts: yes. The patient participates in regular exercise: yes. Has the patient ever been transfused or tattooed?: yes. The patient reports that there is not domestic violence in her life.   Menstrual History: OB History   Grav Para Term Preterm Abortions TAB SAB Ect Mult Living                  No LMP recorded. Patient is postmenopausal.    The following portions of the patient's history were reviewed and updated as appropriate: allergies, current medications, past family history, past medical history, past social history, past surgical history and problem list.  Review of Systems A comprehensive review of systems was negative.    Objective:    BP 114/71  Pulse 87  Temp(Src) 98.3 F (36.8 C) (Oral)  Ht 5\' 7"  (1.702 m)  Wt 208 lb 9.6 oz (94.62 kg)  BMI 32.66 kg/m2 General appearance: alert, cooperative and no distress Neck: supple, symmetrical, trachea midline and thyroid not enlarged, symmetric, no tenderness/mass/nodules Lungs: clear to auscultation bilaterally Breasts: normal appearance, no masses or tenderness Heart: regular rate and rhythm, S1, S2 normal, no murmur, click, rub or gallop Abdomen: soft, non-tender; bowel sounds normal; no masses,  no organomegaly Pelvic: cervix normal in appearance, external genitalia normal, no adnexal masses or tenderness, no cervical motion tenderness, rectovaginal septum normal, uterus normal size, shape, and consistency and vagina normal without discharge Extremities: extremities normal, atraumatic, no cyanosis or  edema Skin: erythema with some white areas in folds of groin.    Assessment:    Healthy female exam Candida of skin--groin area    Plan:     All questions answered. Await pap smear results.  Nystatin powder topically PRN for yeast in groin area

## 2013-10-07 ENCOUNTER — Telehealth: Payer: Self-pay

## 2013-10-07 NOTE — Telephone Encounter (Signed)
Message copied by Louanna Raw on Wed Oct 07, 2013  8:42 AM ------      Message from: Sharen Counter A      Created: Tue Oct 06, 2013  6:53 PM      Regarding: RE: Prescription needs to be called      Contact: [phone]       Ladona Ridgel,            Thanks:)  Would you call her tomorrow and see if she would prefer a different pharmacy or she can just buy OTC powder for athlete's foot.  It is antifungal powder and will work the same as the prescription. Thanks so much.            ----- Message -----         From: Louanna Raw, RN         Sent: 10/06/2013  10:06 AM           To: Hurshel Party, CNM      Subject: RE: Prescription needs to be called                      Dyanne Carrel,             I called the pharmacy but they do not carry the powder. They only carry the cream. If you'd like to prescribe the cream, let me know, and I can call the pharmacy.             Thanks,       Ladona Ridgel             ----- Message -----         From: Hurshel Party, CNM         Sent: 10/05/2013   8:11 PM           To: [facility]      Subject: Prescription needs to be called                          I saw this pt yesterday in Gyn clinic and told her I would send in prescription for Nystatin powder.  I tried to sent prescription but realized she has [facility] pharmacy and I cannot e-prescribe and it is after hours so I cannot call tonight.  I put the prescription in  Epic under her medications as a phone-in.  Can someone call in her prescription for me tomorrow?  I won't be in to work until 4 pm.  Thanks so much.              ------

## 2013-10-07 NOTE — Telephone Encounter (Signed)
Called pt. And left message stating we are trying to reach her if she can call the clinic today between 8-4.

## 2013-10-07 NOTE — Telephone Encounter (Signed)
Called pt. And informed her that her pharmacy does not cary the nystatin powder that Misty Stanley wanted to prescribe. I told her we could send it to another pharmacy or she could pick up OTC powder for athlete's foot. Pt. Stated she will just get the OTC powder. Pt. Had no other questions or concerns.

## 2013-10-28 ENCOUNTER — Ambulatory Visit: Payer: Self-pay

## 2013-11-02 ENCOUNTER — Telehealth: Payer: Self-pay | Admitting: Emergency Medicine

## 2013-11-02 ENCOUNTER — Telehealth: Payer: Self-pay | Admitting: Internal Medicine

## 2013-11-02 NOTE — Telephone Encounter (Signed)
Spoke with pt. Scheduled to come in tomorrow for nurse visit

## 2013-11-02 NOTE — Telephone Encounter (Signed)
Spoke with pt regarding incision dressing. Pt will come in for dressing change 11/03/13.

## 2013-11-02 NOTE — Telephone Encounter (Signed)
Pt called to let us know that she just had cancer surgery and needs a nurse to call her back because she needs a refill of her bandages and covering for her wound, please contact pt

## 2013-11-03 ENCOUNTER — Ambulatory Visit: Payer: No Typology Code available for payment source | Attending: Internal Medicine

## 2013-11-03 NOTE — Progress Notes (Unsigned)
Pt comes in today for dressing change and wound check s/p abdominal surgery 12/12. Mid line abdominal incision well approx 13 staple line incision intact with minimal drainage.slight redness noted. 1 inch packing removed with abdominal binder dressings Pt took pain meds before dressing change Tolerating well. Vss. afebrile

## 2013-11-03 NOTE — Progress Notes (Unsigned)
Cleaned wound with nss and betadine Packed x 3 puncture site with 1 inch packing and re applied wound binder with mepilex tape Pt to return to post op appt with GI in 2 weeks

## 2013-11-10 NOTE — Progress Notes (Unsigned)
Patient came in today for dressing change 11/10/13 Removed packing from 3 puncture wounds in the incision Cleaned with NSS and betadine Repacked puncture wounds with 1 " packing Applied new abd dressing with medipore tape Wound area dry with no D/C

## 2013-11-17 ENCOUNTER — Ambulatory Visit: Payer: No Typology Code available for payment source | Attending: Internal Medicine

## 2013-11-17 NOTE — Progress Notes (Unsigned)
Pt here post op wound care and dressing change to midline abdomen Soiled yellow with greenish dressing removed with foul odor Minimal redness noted.  Incision well approximated,intact with x 2 puncture site healed Pt denies pain or fever Pt informed to call surgery team Dr. Charma Igo  To check wound

## 2013-11-20 ENCOUNTER — Ambulatory Visit: Payer: No Typology Code available for payment source | Attending: Internal Medicine

## 2013-11-23 ENCOUNTER — Ambulatory Visit: Payer: No Typology Code available for payment source | Attending: Internal Medicine

## 2013-11-23 ENCOUNTER — Ambulatory Visit: Payer: Self-pay | Admitting: Internal Medicine

## 2013-11-23 VITALS — BP 129/76 | HR 79 | Temp 98.2°F | Resp 16

## 2013-11-23 DIAGNOSIS — T814XXA Infection following a procedure, initial encounter: Secondary | ICD-10-CM

## 2013-11-23 NOTE — Progress Notes (Unsigned)
Pt is here for post op abdominal incision Dressing with blood tinged drainage,without foul odor Oozing noted to lower puncture site. Wound cx obtained Denies fever or pain Incision well approximated,intact with drainage

## 2013-11-25 ENCOUNTER — Ambulatory Visit: Payer: No Typology Code available for payment source | Attending: Internal Medicine

## 2013-11-25 MED ORDER — NYSTATIN 100000 UNIT/GM EX POWD: CUTANEOUS | Status: DC

## 2013-11-25 NOTE — Progress Notes (Unsigned)
Pt here for post op abdominal surgical dressing change. Abdominal incision intact with oozing bloody drainage from 2nd puncture site Wound packing and saturated dressing changed,no odor Right groin and abdominal fold redness noted- pt has some nystatin powder at home Denies pain or fever

## 2013-11-25 NOTE — Patient Instructions (Signed)
Pt instructed to keep wound clean and intact Use Nystatin Powder and return Friday for f/u dressing change

## 2013-11-27 ENCOUNTER — Ambulatory Visit: Payer: No Typology Code available for payment source | Attending: Internal Medicine

## 2013-11-27 ENCOUNTER — Telehealth: Payer: Self-pay

## 2013-11-27 LAB — WOUND CULTURE
Gram Stain: NONE SEEN
Gram Stain: NONE SEEN

## 2013-11-27 MED ORDER — CIPROFLOXACIN HCL 500 MG PO TABS: 500.0000 mg | ORAL_TABLET | Freq: Two times a day (BID) | ORAL | Status: DC

## 2013-11-27 NOTE — Progress Notes (Unsigned)
Pt here for abdominal incision dressing change Saturated bloody drainage noted with minimal odor No redness noted. Incision well approximated, healing well with slight oozing to 2nd puncture site Dressing changed with 4x4 gauze,and abdominal pad  Vss. afebrile

## 2013-11-27 NOTE — Telephone Encounter (Signed)
Message copied by Dorothe Pea on Fri Nov 27, 2013  5:16 PM ------      Message from: Tresa Garter      Created: Fri Nov 27, 2013  4:31 PM       Please inform patient that her wound is infected with Staphylococcus aureus sensitive to ciprofloxacin      Please call in prescription for ciprofloxacin 500 mg tablet by mouth twice a day for 10 days      Continue wound care ------

## 2013-11-27 NOTE — Patient Instructions (Signed)
Pt instructed to continue changing dressing daily and keep incision clean,dry Continue using Nystatin powder to right groin area and abdominal fold

## 2013-11-27 NOTE — Telephone Encounter (Signed)
PATIENT is aware of her wound culture result [facility]

## 2013-11-30 ENCOUNTER — Ambulatory Visit: Payer: No Typology Code available for payment source | Attending: Internal Medicine

## 2013-11-30 DIAGNOSIS — E1165 Type 2 diabetes mellitus with hyperglycemia: Principal | ICD-10-CM

## 2013-11-30 DIAGNOSIS — Z0000 Encounter for general adult medical examination without abnormal findings: Secondary | ICD-10-CM

## 2013-11-30 DIAGNOSIS — IMO0001 Reserved for inherently not codable concepts without codable children: Secondary | ICD-10-CM

## 2013-11-30 LAB — LIPID PANEL
Cholesterol: 181 mg/dL (ref 0–200)
HDL: 33 mg/dL — ABNORMAL LOW (ref >39)
LDL Cholesterol: 97 mg/dL (ref 0–99)
Total CHOL/HDL Ratio: 5.5 Ratio
Triglycerides: 256 mg/dL — ABNORMAL HIGH (ref <150)
VLDL: 51 mg/dL — ABNORMAL HIGH (ref 0–40)

## 2013-11-30 LAB — COMPREHENSIVE METABOLIC PANEL
ALT: <8 U/L (ref 0–35)
AST: 9 U/L (ref 0–37)
Albumin: 3.3 g/dL — ABNORMAL LOW (ref 3.5–5.2)
Alkaline Phosphatase: 103 U/L (ref 39–117)
BUN: 16 mg/dL (ref 6–23)
CO2: 25 meq/L (ref 19–32)
Calcium: 8.5 mg/dL (ref 8.4–10.5)
Chloride: 103 mEq/L (ref 96–112)
Creat: 1.12 mg/dL — ABNORMAL HIGH (ref 0.50–1.10)
GLUCOSE: 177 mg/dL — AB (ref 70–99)
POTASSIUM: 4.3 meq/L (ref 3.5–5.3)
SODIUM: 140 meq/L (ref 135–145)
Total Bilirubin: 0.3 mg/dL (ref 0.3–1.2)
Total Protein: 5.9 g/dL — ABNORMAL LOW (ref 6.0–8.3)

## 2013-11-30 NOTE — Progress Notes (Unsigned)
Patient here for dressing change to abd incision Post op Mild drainage Last puncture hole had closed  Has been on antibiotics past three days Dry abd pad placed on incision and intact

## 2013-12-02 ENCOUNTER — Ambulatory Visit: Payer: No Typology Code available for payment source | Attending: Internal Medicine

## 2013-12-02 NOTE — Progress Notes (Unsigned)
Patient here for dressing change Wound is healing nicely Minimal drainage Cleaned with normal saline and betadine Abd pad placed on wound and taped Patient to follow up with surgeon this week

## 2013-12-09 ENCOUNTER — Ambulatory Visit: Payer: No Typology Code available for payment source | Attending: Internal Medicine

## 2013-12-09 NOTE — Progress Notes (Unsigned)
Patient here for post-op dressing change Wound is dry and intact Surgeon recently opened two incision areas that were not healing properly Cleansed with NSS and betadine Used quarter inch packing in both puncture holes Covered with non- ahere gauze bandages and covered entire Wound with ABD pad and tape

## 2013-12-10 ENCOUNTER — Ambulatory Visit: Payer: No Typology Code available for payment source | Attending: Internal Medicine

## 2013-12-11 ENCOUNTER — Ambulatory Visit: Payer: No Typology Code available for payment source | Attending: Internal Medicine

## 2013-12-11 ENCOUNTER — Telehealth: Payer: Self-pay

## 2013-12-11 NOTE — Progress Notes (Unsigned)
Pt here for wound check with dressing change Abdominal incision incision well approximated with x 2 open puncture site packing saturated tan drainage Slight redness noted with pain Denies fever,chills

## 2013-12-11 NOTE — Patient Instructions (Signed)
Dressing cleansed with betadine swab and 1 inch packing placed to both puncture sites. Covered with sterile 4x4 and covered with ABD pad. Pt to return on Monday

## 2013-12-14 ENCOUNTER — Ambulatory Visit: Payer: No Typology Code available for payment source | Attending: Internal Medicine

## 2013-12-14 VITALS — BP 104/70 | HR 78 | Temp 98.4°F | Resp 16 | Wt 193.0 lb

## 2013-12-14 DIAGNOSIS — E119 Type 2 diabetes mellitus without complications: Secondary | ICD-10-CM

## 2013-12-14 LAB — GLUCOSE, POCT (MANUAL RESULT ENTRY): POC Glucose: 219 mg/dl — AB (ref 70–99)

## 2013-12-14 NOTE — Progress Notes (Unsigned)
Pt here for wound care/dressing change Saturated yellow dressing/packing noted Abdominal incision well approximated,intact with redness and x 2 puncture site  Temp 98.4  CBG 219

## 2013-12-14 NOTE — Patient Instructions (Signed)
Dressing cleansed with anti-bacteril and betadine Dressing changed with DuoDerm/4x4 gauze/packing 1 inch to both puncture site and covered with ABD pad

## 2013-12-15 ENCOUNTER — Other Ambulatory Visit: Payer: Self-pay

## 2013-12-16 ENCOUNTER — Ambulatory Visit: Payer: No Typology Code available for payment source | Attending: Internal Medicine

## 2013-12-16 NOTE — Progress Notes (Unsigned)
Pt is here for a dressing change. Wounds are healing nicely. I cleaned the area and changed the dressing.

## 2013-12-18 ENCOUNTER — Ambulatory Visit: Payer: No Typology Code available for payment source | Attending: Internal Medicine

## 2013-12-21 ENCOUNTER — Ambulatory Visit: Payer: No Typology Code available for payment source | Attending: Internal Medicine

## 2013-12-21 VITALS — BP 130/82 | HR 70 | Temp 98.7°F | Resp 16

## 2013-12-21 DIAGNOSIS — E162 Hypoglycemia, unspecified: Secondary | ICD-10-CM

## 2013-12-21 DIAGNOSIS — T8149XA Infection following a procedure, other surgical site, initial encounter: Secondary | ICD-10-CM

## 2013-12-21 LAB — GLUCOSE, POCT (MANUAL RESULT ENTRY): POC GLUCOSE: 134 mg/dL — AB (ref 70–99)

## 2013-12-21 NOTE — Progress Notes (Unsigned)
Pt comes in today for wound care/dressing change Saturated serous drainage removed with packing intact No odor present Abdominal incision well approximated,with pink areas noted. Puncture x 2 holes packed with 18 inch packing and covered with tegraterm/ABD pad

## 2013-12-21 NOTE — Progress Notes (Unsigned)
Patient here for wound change post op abd surgery Large amount of drainage noted to opening near top  Cultured the wound and used 1 inch packing approximately 18 inches to pack the openings Patient tolerated procedure well Covered with abd pad and tape Patient felt a little light headed when she stood up Took her blood sugar(134) and applied cold compress to head Patient states she felt better

## 2013-12-22 ENCOUNTER — Telehealth: Payer: Self-pay

## 2013-12-22 NOTE — Telephone Encounter (Signed)
[facility]ed [facility] regarding preliminary report from wound culture Spoke with Pamala Hurry and that request was faxed to our office As of today No WBC seen No squamous epithelial cells seen No organisms seen

## 2013-12-23 ENCOUNTER — Other Ambulatory Visit: Payer: Self-pay

## 2013-12-24 ENCOUNTER — Other Ambulatory Visit (HOSPITAL_COMMUNITY): Payer: Self-pay | Admitting: General Surgery

## 2013-12-24 ENCOUNTER — Ambulatory Visit: Payer: Self-pay | Admitting: Internal Medicine

## 2013-12-24 ENCOUNTER — Encounter (HOSPITAL_BASED_OUTPATIENT_CLINIC_OR_DEPARTMENT_OTHER): Payer: No Typology Code available for payment source | Attending: Internal Medicine

## 2013-12-24 DIAGNOSIS — E669 Obesity, unspecified: Secondary | ICD-10-CM | POA: Insufficient documentation

## 2013-12-24 DIAGNOSIS — Z9049 Acquired absence of other specified parts of digestive tract: Secondary | ICD-10-CM | POA: Insufficient documentation

## 2013-12-24 DIAGNOSIS — Y838 Other surgical procedures as the cause of abnormal reaction of the patient, or of later complication, without mention of misadventure at the time of the procedure: Secondary | ICD-10-CM | POA: Insufficient documentation

## 2013-12-24 DIAGNOSIS — Z794 Long term (current) use of insulin: Secondary | ICD-10-CM | POA: Insufficient documentation

## 2013-12-24 DIAGNOSIS — L0291 Cutaneous abscess, unspecified: Secondary | ICD-10-CM

## 2013-12-24 DIAGNOSIS — T8189XA Other complications of procedures, not elsewhere classified, initial encounter: Secondary | ICD-10-CM | POA: Insufficient documentation

## 2013-12-24 DIAGNOSIS — Z79899 Other long term (current) drug therapy: Secondary | ICD-10-CM | POA: Insufficient documentation

## 2013-12-24 DIAGNOSIS — I10 Essential (primary) hypertension: Secondary | ICD-10-CM | POA: Insufficient documentation

## 2013-12-24 DIAGNOSIS — Z85038 Personal history of other malignant neoplasm of large intestine: Secondary | ICD-10-CM | POA: Insufficient documentation

## 2013-12-24 DIAGNOSIS — Z9884 Bariatric surgery status: Secondary | ICD-10-CM | POA: Insufficient documentation

## 2013-12-24 DIAGNOSIS — E119 Type 2 diabetes mellitus without complications: Secondary | ICD-10-CM | POA: Insufficient documentation

## 2013-12-24 LAB — GLUCOSE, CAPILLARY: Glucose-Capillary: 175 mg/dL — ABNORMAL HIGH (ref 70–99)

## 2013-12-25 ENCOUNTER — Telehealth: Payer: Self-pay

## 2013-12-25 LAB — WOUND CULTURE
Gram Stain: NONE SEEN
Gram Stain: NONE SEEN
Gram Stain: NONE SEEN

## 2013-12-25 MED ORDER — CLINDAMYCIN HCL 300 MG PO CAPS: 300.0000 mg | ORAL_CAPSULE | Freq: Four times a day (QID) | ORAL | Status: DC

## 2013-12-25 NOTE — Progress Notes (Signed)
[facility]  NAME:  BENISHA, HADAWAY NO.:  [id number]  MEDICAL RECORD NO.:  [id number]      DATE OF BIRTH:  1950/12/07  PHYSICIAN:  Judene Companion, M.D.           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  Now since that time, she has had wound breakdown and has two sinus tracts which are draining purulent material in the midline of her old wound.  When she came here today, her blood pressure was 120/80, her pulse was 70, respiration 16, temperature 98.  Her blood sugar was 175.  Her doctor sent her here for treatment and examination of this wound.  She has two sinus tracts that are about 10 cm deep and go deep into the subcutaneous tissue.  They drained purulent material, which we cultured.  She has been on Cipro in the past.  Other medicines that she takes are Lantus insulin 75 units a day. She takes lisinopril 10 mg a day for her hypertension.  The patient also states that this has been treated by her doctor and they have been putting drains, but apparently they have not been getting them down all the way to the depths of the wound.  So today, I probed this and both of these sinus tracts are 10 cm deep and I packed it with iodoform gauze, and we ordered a CT scan to see if there is any un-drained loculations. I suspect that this may even take surgery to debride out this abscess cavity and packet wide open so that it will heal from inside out.  DIAGNOSES: 1. Persistent drainage from abdominal wound, nonhealing surgical     wound, possible abscess in the anterior abdominal wall. 2. Diabetes. 3. Hypertension. 4. Obesity.     Judene Companion, M.D.     PP/MEDQ  D:  12/24/2013  T:  12/25/2013  Job:  [id number]

## 2013-12-25 NOTE — Telephone Encounter (Signed)
Message copied by Dorothe Pea on Fri Dec 25, 2013  2:22 PM ------      Message from: Tresa Garter      Created: Fri Dec 25, 2013 12:07 PM       Please inform patient that her wound culture grows Staphylococcus aureus, we need to start her on antibiotics            Please call in clindamycin 300 mg tablet by mouth every 6 hour for 10 days ------

## 2013-12-25 NOTE — Telephone Encounter (Signed)
Left message on machine to return our call. 

## 2013-12-28 ENCOUNTER — Other Ambulatory Visit (HOSPITAL_COMMUNITY): Payer: Self-pay | Admitting: General Surgery

## 2013-12-28 ENCOUNTER — Ambulatory Visit (HOSPITAL_COMMUNITY)
Admission: RE | Admit: 2013-12-28 | Discharge: 2013-12-28 | Disposition: A | Discharge status: Home / Self Care | Payer: No Typology Code available for payment source | Source: Ambulatory Visit | Attending: General Surgery | Admitting: General Surgery

## 2013-12-28 ENCOUNTER — Encounter (HOSPITAL_COMMUNITY): Payer: Self-pay

## 2013-12-28 DIAGNOSIS — N201 Calculus of ureter: Secondary | ICD-10-CM | POA: Insufficient documentation

## 2013-12-28 DIAGNOSIS — R109 Unspecified abdominal pain: Secondary | ICD-10-CM | POA: Insufficient documentation

## 2013-12-28 DIAGNOSIS — L0291 Cutaneous abscess, unspecified: Secondary | ICD-10-CM

## 2013-12-28 DIAGNOSIS — Z9049 Acquired absence of other specified parts of digestive tract: Secondary | ICD-10-CM | POA: Insufficient documentation

## 2013-12-28 DIAGNOSIS — C189 Malignant neoplasm of colon, unspecified: Secondary | ICD-10-CM | POA: Insufficient documentation

## 2013-12-28 DIAGNOSIS — Z980 Intestinal bypass and anastomosis status: Secondary | ICD-10-CM | POA: Insufficient documentation

## 2013-12-28 MED ORDER — IOHEXOL 300 MG/ML  SOLN
100.0000 mL | Freq: Once | INTRAMUSCULAR | Status: AC | PRN
  Administered 2013-12-28: 100 mL via INTRAVENOUS

## 2013-12-28 MED ORDER — IOHEXOL 300 MG/ML  SOLN
50.0000 mL | Freq: Once | INTRAMUSCULAR | Status: AC | PRN
  Administered 2013-12-28: 50 mL via ORAL

## 2013-12-30 ENCOUNTER — Telehealth: Payer: Self-pay | Admitting: *Deleted

## 2013-12-30 NOTE — Telephone Encounter (Signed)
Spoke with patient by phone and confirmed appointment with Dr. Benay Spice for 01/12/14.  Contact names, numbers, and directions were provided.

## 2014-01-11 ENCOUNTER — Encounter (HOSPITAL_BASED_OUTPATIENT_CLINIC_OR_DEPARTMENT_OTHER): Payer: No Typology Code available for payment source | Attending: Plastic Surgery

## 2014-01-11 ENCOUNTER — Ambulatory Visit: Payer: Self-pay

## 2014-01-11 DIAGNOSIS — T8183XA Persistent postprocedural fistula, initial encounter: Secondary | ICD-10-CM | POA: Insufficient documentation

## 2014-01-11 DIAGNOSIS — T8189XA Other complications of procedures, not elsewhere classified, initial encounter: Secondary | ICD-10-CM | POA: Insufficient documentation

## 2014-01-11 DIAGNOSIS — Y839 Surgical procedure, unspecified as the cause of abnormal reaction of the patient, or of later complication, without mention of misadventure at the time of the procedure: Secondary | ICD-10-CM | POA: Insufficient documentation

## 2014-01-12 ENCOUNTER — Other Ambulatory Visit: Payer: Self-pay | Admitting: *Deleted

## 2014-01-12 ENCOUNTER — Encounter: Payer: Self-pay | Admitting: Oncology

## 2014-01-12 ENCOUNTER — Ambulatory Visit: Payer: No Typology Code available for payment source

## 2014-01-12 ENCOUNTER — Ambulatory Visit (HOSPITAL_BASED_OUTPATIENT_CLINIC_OR_DEPARTMENT_OTHER): Payer: No Typology Code available for payment source | Admitting: Oncology

## 2014-01-12 VITALS — BP 139/50 | HR 92 | Temp 97.9°F | Resp 18 | Ht 67.0 in | Wt 195.8 lb

## 2014-01-12 DIAGNOSIS — D509 Iron deficiency anemia, unspecified: Secondary | ICD-10-CM

## 2014-01-12 DIAGNOSIS — C187 Malignant neoplasm of sigmoid colon: Secondary | ICD-10-CM

## 2014-01-12 DIAGNOSIS — C189 Malignant neoplasm of colon, unspecified: Secondary | ICD-10-CM | POA: Insufficient documentation

## 2014-01-12 NOTE — Progress Notes (Signed)
Dr. Benay Spice requested MSI/IHC testing performed on the patient's tumor.  She had her surgery done at [facility] on 10/23/2013.  Request sent to pathology, spoke with K. Sharlett Iles, for testing request on ACCESSION NUMBER: [id number].

## 2014-01-12 NOTE — Progress Notes (Signed)
[place] New Patient Consult   Referring EX:BMWUX Catherine Fowler 63 y.o.  1951-02-09    Reason for Referral: Colon cancer     HPI: She underwent a screening colonoscopy at [facility] on 09/18/2013. An 8 mm polyp was removed from the a sending colon. To 1.57 or polyps were found at the hepatic flexure. These were removed. A mass was found in the descending colon suspicious for malignancy. The tumor was fungating and measured 5 cm. Multiple biopsies were performed. A single 15 mm polyp was found in the sigmoid colon. A polypectomy was performed. Numerous other polyps were seen in the transverse colon that were not removed.  The pathology tubular adenomas at the right colon and hepatic flexure. 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  She was referred to Dr. Charma Igo and was taken the operating room on 10/23/2013 for a laparoscopic resection of the sigmoid colon tumor. There was a large adhesion burden between the small bowel and anterior abdominal wall with prosthetic mesh. The palpable sigmoid mass was adherent to the anterior abdominal wall.  The pathology ([id number]) confirmed a moderately differentiated adenocarcinoma invasive into but not through the muscularis propria. 24 pericolonic lymph nodes were negative for tumor. The resection margins were negative. No lymphovascular or perineural invasion. No tumor deposits. No macroscopic tumor perforation.  The abdominal wound remains open and she is now followed at the wound clinic.  She reports feeling well prior to the abdominal surgery.  Past Medical History  Diagnosis Date  . Diabetes mellitus   . Chronic kidney disease-atrophic left kidney    . Colon cancer-stage II (T2 N0)  10/23/2013     .   Multiple colon polyps identified on a colonoscopy                                  09/18/2013   .   left ureteropelvic  junction calculus Past Surgical History  Procedure Laterality Date  .  G1 P1     . Appendectomy    . Cholecystectomy    . Tubal ligation    . Breast surgery  2/97    breast reduction   . Gastric bypass  10/16/2004  . Rhinoplasty    . Ventral hernia repair  06/17/2012    Procedure: HERNIA REPAIR VENTRAL ADULT;  Surgeon: Adin Hector, MD;  Location: [facility] ORS;  Service: General;  Laterality: N/A;  .  sigmoid colectomy    10/23/2013     .   Iron deficiency anemia                                                                                                     2014   .   Right-sided hearing loss  Family History  Problem Relation Age of Onset  . Diabetes type II Father   . Diabetes type II Sister   . Diabetes type II Other   .  Diabetes type II Maternal Aunt     no family history of cancer  Current outpatient prescriptions:DiphenhydrAMINE HCl, Sleep, (ZZZQUIL PO), Take 30 mLs by mouth daily as needed (sleep)., Disp: , Rfl: ;  Insulin Glargine (LANTUS SOLOSTAR) 100 UNIT/ML SOPN, Inject 75 Units into the skin at bedtime., Disp: 7 pen, Rfl: 5;  lisinopril (PRINIVIL,ZESTRIL) 10 MG tablet, Take 1 tablet (10 mg total) by mouth daily., Disp: 30 tablet, Rfl: 3 glucose monitoring kit (FREESTYLE) monitoring kit, 1 each by Does not apply route 4 (four) times daily - after meals and at bedtime. 1 month Diabetic Testing Supplies for QAC-QHS accuchecks. Any brand okay, Disp: 1 each, Rfl: 1;  Naproxen Sodium (ALEVE PO), Take 2 tablets by mouth daily as needed (pain)., Disp: , Rfl:  nystatin (MYCOSTATIN/NYSTOP) 100000 UNIT/GM POWD, Apply enough to cover the affected area twice per day as needed., Disp: 30 g, Rfl: 2;  sulfamethoxazole-trimethoprim (BACTRIM DS) 800-160 MG per tablet, Take 1 tablet by mouth 2 (two) times daily., Disp: , Rfl:   Allergies: No Known Allergies  Social History: She has worked in an office occupation. She does not use tobacco or alcohol. No transfusion history. No risk factor for  HIV or hepatitis.   ROS:   Positives include: 17 pound weight loss and anorexia following colon surgery, nonproductive cough, open abdominal wound  A complete ROS was otherwise negative.  Physical Exam:  Blood pressure 139/50, pulse 92, temperature 97.9 F (36.6 C), temperature source Oral, resp. rate 18, height '5\' 7"'  (1.702 m), weight 195 lb 12.8 oz (88.814 kg), SpO2 97.00%.  HEENT: Oropharynx without visible mass, neck without mass Lungs: Clear bilaterally Cardiac: Regular rate and rhythm Abdomen: No hepatosplenomegaly, small open areas at the midline wound with packing in place and granulation tissue  Vascular: No leg edema Lymph nodes: No cervical, supraclavicular, axillary, or inguinal nodes Neurologic: Alert and oriented, the motor exam appears intact in the upper and lower extremities Skin: Mild erythema in a tape distribution over the abdominal wall Musculoskeletal: No spine tenderness   LAB:  CBC  Lab Results  Component Value Date   WBC 7.3 08/16/2013   HGB 9.2* 08/16/2013   HCT 31.5* 08/16/2013   MCV 71.3* 08/16/2013   PLT 366 08/16/2013   NEUTROABS 4.6 08/16/2013     CMP      Component Value Date/Time   NA 140 11/30/2013 1014   K 4.3 11/30/2013 1014   CL 103 11/30/2013 1014   CO2 25 11/30/2013 1014   GLUCOSE 177* 11/30/2013 1014   BUN 16 11/30/2013 1014   CREATININE 1.12* 11/30/2013 1014   CREATININE 1.16* 08/16/2013 0942   CALCIUM 8.5 11/30/2013 1014   PROT 5.9* 11/30/2013 1014   ALBUMIN 3.3* 11/30/2013 1014   AST 9 11/30/2013 1014   ALT <8 11/30/2013 1014   ALKPHOS 103 11/30/2013 1014   BILITOT 0.3 11/30/2013 1014   GFRNONAA 49* 08/16/2013 0942   GFRAA 56* 08/16/2013 0942   CEA on 10/14/2013-2.5    Assessment/Plan:   1. Stage I (T2 N0) adenocarcinoma of the sigmoid colon, status post a sigmoid colectomy 10/23/2013  2. Diabetes  3. Delayed wound healing following the abdominal surgery-now followed at the wound clinic  4. Multiple colon polyps on the  colonoscopy 09/18/2013 including transverse colon polyps that were not removed  5. Iron deficiency anemia  6. Ventral hernia repair August 2013  7. Left UPJ stone  8. Left renal atrophy/chronic renal insufficiency   Disposition:   Catherine Fowler has  been diagnosed with stage I colon cancer. I discussed the prognosis and reviewed the details of the surgical pathology report with her today. She has a good prognosis for a long-term disease-free survival. There is no indication for adjuvant chemotherapy. We discussed  diet and exercise maneuvers that may decrease the risk of developing colon cancer.  Multiple polyps were noted on the colonoscopy in November of 2014. She should undergo a surveillance colonoscopy in November of this year, especially since there were transverse colon polyps that do not appear to have been removed. We will test the sigmoid colon tumor for microsatellite instability and mismatch repair protein expression.  She will alert her family members of the colon cancer diagnosis so they can receive appropriate screening.  She had iron deficiency anemia last year. We will recommend she resume iron therapy and followup with her primary physician for a repeat CBC.  Catherine Fowler plans to continue followup with Dr. Jerene Pitch and her primary physician. She is not scheduled for a followup appointment at the [facility]. We will see her in the future as needed.  [place], [place] 01/12/2014, 2:25 PM

## 2014-01-12 NOTE — Progress Notes (Signed)
Met with Catherine Fowler.   Explained role of nurse navigator. Educational information provided on colorectal cancer   [place] resources provided to patient, including SW service information.  She requested that SW call her.  Message sent to G. Hock SW.  Contact names and phone numbers were provided for entire [facility] team.  Teach back method was used.  No barriers to care identified.

## 2014-01-13 ENCOUNTER — Telehealth: Payer: Self-pay | Admitting: *Deleted

## 2014-01-13 ENCOUNTER — Other Ambulatory Visit: Payer: Self-pay | Admitting: *Deleted

## 2014-01-13 NOTE — Telephone Encounter (Signed)
Pt returned call, informed her of Dr. Gearldine Shown recommendation to resume iron and F/U with PCP for CBC. She stated she will discuss with her PCP.

## 2014-01-13 NOTE — Telephone Encounter (Signed)
This RN was notified by K. Sharlett Iles in pathology re: request for MSI/IHC.  She stated that since the pathology was done at [facility], the extra testing on tissue needs to be done there.  Will inform Dr. Benay Spice.

## 2014-01-13 NOTE — Telephone Encounter (Signed)
Left message on voicemail requesting pt call office. (Per Dr. Benay Spice: Pt needs to follow up with primary care MD to check HGB and resume iron.)

## 2014-01-14 ENCOUNTER — Encounter: Payer: Self-pay | Admitting: *Deleted

## 2014-01-14 NOTE — Progress Notes (Signed)
[place] Work  Clinical Social Work was referred by patient navigator for assessment of psychosocial needs due to resource concerns and questions.  Clinical Social Worker contacted patient via phone at home and spoke with her at length. Pt just wanted to share her concern with lack of assistance after her surgery. Pt felt like there should be resources available at no cost to assist her and was surprised at the limited options. CSW provided resource education that Pt may find helpful in the future and she was most appreciative. Pt denies current concerns, just wanted someone to be aware of her concerns. No further CSW assistance is needed. Pt agrees to seek out CSW as needed.    Clinical Social Work interventions: Resource education Supportive listening  Loren Racer, LCSW Clinical Social Worker [facility] for [place] Wednesday, Thursday and Friday Phone: [phone] Fax: [phone]

## 2014-01-18 ENCOUNTER — Telehealth: Payer: Self-pay | Admitting: *Deleted

## 2014-01-18 NOTE — Telephone Encounter (Signed)
Request submitted to [facility] to perform MSI/IHC testing on Accession # [id number].  Sent to [employer] in pathology dept. At [facility].

## 2014-01-25 ENCOUNTER — Other Ambulatory Visit: Payer: Self-pay | Admitting: *Deleted

## 2014-01-26 ENCOUNTER — Telehealth: Payer: Self-pay | Admitting: Oncology

## 2014-01-26 NOTE — Telephone Encounter (Signed)
S/w lisa thomas the pa regarding a f/u spot to see this pt since dr sherrill's schedule is full. Gina dixon aware.

## 2014-01-29 ENCOUNTER — Telehealth: Payer: Self-pay | Admitting: Dietician

## 2014-01-29 ENCOUNTER — Telehealth: Payer: Self-pay | Admitting: *Deleted

## 2014-01-29 NOTE — Telephone Encounter (Signed)
Brief Outpatient Oncology Nutrition Note  Patient has been identified to be at risk on malnutrition screen.  Wt Readings from Last 10 Encounters:  01/12/14 195 lb 12.8 oz (88.814 kg)  12/14/13 193 lb (87.544 kg)  10/05/13 208 lb 9.6 oz (94.62 kg)  09/04/13 212 lb (96.163 kg)  08/14/13 216 lb (97.977 kg)  07/15/13 218 lb (98.884 kg)  05/01/13 217 lb 6.4 oz (98.612 kg)  07/03/12 212 lb 2 oz (96.219 kg)  06/18/12 240 lb 4.8 oz (109 kg)  06/18/12 240 lb 4.8 oz (109 kg)    Dx:  Colon Cancer  Called patient due to weight loss.  Patient was unavailable.  Provided Contact information for the [place] RD.  Antonieta Iba, RD, LDN

## 2014-01-29 NOTE — Telephone Encounter (Signed)
Per Dr. Benay Spice, patient does not need a follow-up appointment.

## 2014-02-10 ENCOUNTER — Encounter (HOSPITAL_BASED_OUTPATIENT_CLINIC_OR_DEPARTMENT_OTHER): Payer: Medicare Other | Attending: General Surgery

## 2014-02-10 DIAGNOSIS — T8189XA Other complications of procedures, not elsewhere classified, initial encounter: Secondary | ICD-10-CM | POA: Insufficient documentation

## 2014-02-10 DIAGNOSIS — Y849 Medical procedure, unspecified as the cause of abnormal reaction of the patient, or of later complication, without mention of misadventure at the time of the procedure: Secondary | ICD-10-CM | POA: Insufficient documentation

## 2014-02-11 ENCOUNTER — Telehealth: Payer: Self-pay | Admitting: Internal Medicine

## 2014-02-11 ENCOUNTER — Ambulatory Visit: Payer: No Typology Code available for payment source | Attending: Internal Medicine

## 2014-02-11 NOTE — Telephone Encounter (Signed)
Pt came in today. Will place mammogram referral

## 2014-02-11 NOTE — Telephone Encounter (Signed)
Pt has come in today requesting a referral for a mammogram; pt is in the lobby and would like to discuss the progress from her surgery;

## 2014-02-17 DIAGNOSIS — T8189XA Other complications of procedures, not elsewhere classified, initial encounter: Secondary | ICD-10-CM | POA: Diagnosis not present

## 2014-02-19 DIAGNOSIS — T8189XA Other complications of procedures, not elsewhere classified, initial encounter: Secondary | ICD-10-CM | POA: Diagnosis not present

## 2014-02-19 DIAGNOSIS — C189 Malignant neoplasm of colon, unspecified: Secondary | ICD-10-CM | POA: Diagnosis not present

## 2014-02-24 DIAGNOSIS — T8189XA Other complications of procedures, not elsewhere classified, initial encounter: Secondary | ICD-10-CM | POA: Diagnosis not present

## 2014-02-25 ENCOUNTER — Telehealth: Payer: Self-pay | Admitting: *Deleted

## 2014-02-25 NOTE — Telephone Encounter (Signed)
Received results for  MSI/IHC testing on Accession # [id number].  Results given to Dr. Benay Spice.

## 2014-03-03 DIAGNOSIS — T8189XA Other complications of procedures, not elsewhere classified, initial encounter: Secondary | ICD-10-CM | POA: Diagnosis not present

## 2014-03-12 ENCOUNTER — Encounter (HOSPITAL_BASED_OUTPATIENT_CLINIC_OR_DEPARTMENT_OTHER): Payer: Medicare Other | Attending: General Surgery

## 2014-03-12 DIAGNOSIS — T8189XA Other complications of procedures, not elsewhere classified, initial encounter: Secondary | ICD-10-CM | POA: Insufficient documentation

## 2014-03-12 DIAGNOSIS — Y849 Medical procedure, unspecified as the cause of abnormal reaction of the patient, or of later complication, without mention of misadventure at the time of the procedure: Secondary | ICD-10-CM | POA: Insufficient documentation

## 2014-03-19 DIAGNOSIS — T8189XA Other complications of procedures, not elsewhere classified, initial encounter: Secondary | ICD-10-CM | POA: Diagnosis not present

## 2014-03-26 DIAGNOSIS — Z5189 Encounter for other specified aftercare: Secondary | ICD-10-CM | POA: Diagnosis not present

## 2014-03-26 DIAGNOSIS — T8189XA Other complications of procedures, not elsewhere classified, initial encounter: Secondary | ICD-10-CM | POA: Diagnosis not present

## 2014-03-30 DIAGNOSIS — T8189XA Other complications of procedures, not elsewhere classified, initial encounter: Secondary | ICD-10-CM | POA: Diagnosis not present

## 2014-04-02 DIAGNOSIS — T8189XA Other complications of procedures, not elsewhere classified, initial encounter: Secondary | ICD-10-CM | POA: Diagnosis not present

## 2014-04-08 ENCOUNTER — Telehealth: Payer: Self-pay | Admitting: Emergency Medicine

## 2014-04-09 DIAGNOSIS — T8189XA Other complications of procedures, not elsewhere classified, initial encounter: Secondary | ICD-10-CM | POA: Diagnosis not present

## 2014-04-13 ENCOUNTER — Encounter (HOSPITAL_BASED_OUTPATIENT_CLINIC_OR_DEPARTMENT_OTHER): Payer: Medicare Other | Attending: General Surgery

## 2014-04-13 DIAGNOSIS — T8189XA Other complications of procedures, not elsewhere classified, initial encounter: Secondary | ICD-10-CM | POA: Insufficient documentation

## 2014-04-13 DIAGNOSIS — Y839 Surgical procedure, unspecified as the cause of abnormal reaction of the patient, or of later complication, without mention of misadventure at the time of the procedure: Secondary | ICD-10-CM | POA: Insufficient documentation

## 2014-04-16 DIAGNOSIS — T8189XA Other complications of procedures, not elsewhere classified, initial encounter: Secondary | ICD-10-CM | POA: Diagnosis not present

## 2014-04-23 DIAGNOSIS — T8189XA Other complications of procedures, not elsewhere classified, initial encounter: Secondary | ICD-10-CM | POA: Diagnosis not present

## 2014-04-29 DIAGNOSIS — T8189XA Other complications of procedures, not elsewhere classified, initial encounter: Secondary | ICD-10-CM | POA: Diagnosis not present

## 2014-04-29 NOTE — Telephone Encounter (Signed)
Pt LVM stating she is returning nurse's call. Please f/u with pt.

## 2014-05-04 ENCOUNTER — Ambulatory Visit: Payer: Medicare Other | Attending: Internal Medicine

## 2014-05-04 DIAGNOSIS — E119 Type 2 diabetes mellitus without complications: Secondary | ICD-10-CM | POA: Diagnosis not present

## 2014-05-04 LAB — GLUCOSE, POCT (MANUAL RESULT ENTRY): POC GLUCOSE: 150 mg/dL — AB (ref 70–99)

## 2014-05-04 LAB — POCT GLYCOSYLATED HEMOGLOBIN (HGB A1C): HEMOGLOBIN A1C: 6.6

## 2014-05-05 ENCOUNTER — Telehealth: Payer: Self-pay | Admitting: Emergency Medicine

## 2014-05-05 LAB — LIPID PANEL
Cholesterol: 254 mg/dL — ABNORMAL HIGH (ref 0–200)
HDL: 42 mg/dL (ref >39)
LDL Cholesterol: 139 mg/dL — ABNORMAL HIGH (ref 0–99)
TRIGLYCERIDES: 366 mg/dL — AB (ref <150)
Total CHOL/HDL Ratio: 6 Ratio
VLDL: 73 mg/dL — ABNORMAL HIGH (ref 0–40)

## 2014-05-05 MED ORDER — FENOFIBRATE 145 MG PO TABS: 145.0000 mg | ORAL_TABLET | Freq: Every day | ORAL | Status: DC

## 2014-05-05 MED ORDER — INSULIN GLARGINE 100 UNIT/ML SOLOSTAR PEN: 75.0000 [IU] | PEN_INJECTOR | Freq: Every day | SUBCUTANEOUS | Status: DC

## 2014-05-05 MED ORDER — PRAVASTATIN SODIUM 20 MG PO TABS: 20.0000 mg | ORAL_TABLET | Freq: Every day | ORAL | Status: DC

## 2014-05-05 NOTE — Telephone Encounter (Signed)
Pt given lab results with new medications to start taking for cholesterol. Pt instructed to start taking Pravastatin 20 mg and Fenofibrate 145 mg tab Medications sent to [place] Pt insulin faxed to [facility] for refill

## 2014-05-05 NOTE — Telephone Encounter (Signed)
Message copied by Ricci Barker on Wed May 05, 2014 12:04 PM ------      Message from: Angelica Chessman E      Created: Wed May 05, 2014 10:46 AM       Please inform patient that her hemoglobin A1c is 6.6% which is an improvement compared to previous results, will encourage her to continue present regimen of blood sugar control. Her cholesterol level has gone up significantly and not need to be treated with medication. We also advise nutritional control with low-cholesterol low-fat diet as well as regular physical exercise at least 3 times a week 30 minutes each time            Please call in medication pravastatin 20 mg tablet by mouth daily 90 tablets with 3 refills      Please call in medication fenofibrate on 145 mg tablet by mouth daily, 90 tablets with 3 refills ------

## 2014-05-07 DIAGNOSIS — T8140XA Infection following a procedure, unspecified, initial encounter: Secondary | ICD-10-CM | POA: Diagnosis not present

## 2014-05-07 DIAGNOSIS — Z5189 Encounter for other specified aftercare: Secondary | ICD-10-CM | POA: Diagnosis not present

## 2014-05-10 ENCOUNTER — Other Ambulatory Visit: Payer: Self-pay | Admitting: Emergency Medicine

## 2014-05-10 MED ORDER — INSULIN GLARGINE 100 UNIT/ML SOLOSTAR PEN: 75.0000 [IU] | PEN_INJECTOR | Freq: Every day | SUBCUTANEOUS | Status: DC

## 2014-05-13 ENCOUNTER — Encounter (HOSPITAL_BASED_OUTPATIENT_CLINIC_OR_DEPARTMENT_OTHER): Payer: Medicare Other | Attending: General Surgery

## 2014-05-13 ENCOUNTER — Ambulatory Visit: Payer: Medicare Other

## 2014-05-13 DIAGNOSIS — T8189XA Other complications of procedures, not elsewhere classified, initial encounter: Secondary | ICD-10-CM | POA: Insufficient documentation

## 2014-05-13 DIAGNOSIS — Y839 Surgical procedure, unspecified as the cause of abnormal reaction of the patient, or of later complication, without mention of misadventure at the time of the procedure: Secondary | ICD-10-CM | POA: Diagnosis not present

## 2014-05-13 NOTE — Progress Notes (Unsigned)
Patient presents today with a itch rash on chest and back. Patient states the rash did not appear until after she started on Pravastatin and Fenofibrate. Patient states she went to Wound Care today and was told by the MD there that Pravastatin was causing the rash. Consulted with Dr. Doreene Burke who advise patient stay of Pravastatin for one week and take Benadryl 25 MG as directed on box. Patient will be placed on Crestor in one week. Patient advised if she still has itching and rash after couple days then she may be reacting to Fenofibrate and patient is to come back to office so PCP can change her regimen. Patient verbalized understanding.

## 2014-05-19 DIAGNOSIS — T8189XA Other complications of procedures, not elsewhere classified, initial encounter: Secondary | ICD-10-CM | POA: Diagnosis not present

## 2014-05-26 DIAGNOSIS — T8189XA Other complications of procedures, not elsewhere classified, initial encounter: Secondary | ICD-10-CM | POA: Diagnosis not present

## 2014-06-02 DIAGNOSIS — T8189XA Other complications of procedures, not elsewhere classified, initial encounter: Secondary | ICD-10-CM | POA: Diagnosis not present

## 2014-06-07 ENCOUNTER — Ambulatory Visit: Payer: Medicare Other | Attending: Internal Medicine | Admitting: Internal Medicine

## 2014-06-07 ENCOUNTER — Encounter: Payer: Self-pay | Admitting: Internal Medicine

## 2014-06-07 VITALS — BP 154/79 | HR 75 | Resp 18 | Ht 67.0 in | Wt 218.0 lb

## 2014-06-07 DIAGNOSIS — Z79899 Other long term (current) drug therapy: Secondary | ICD-10-CM | POA: Insufficient documentation

## 2014-06-07 DIAGNOSIS — E782 Mixed hyperlipidemia: Secondary | ICD-10-CM | POA: Insufficient documentation

## 2014-06-07 DIAGNOSIS — IMO0001 Reserved for inherently not codable concepts without codable children: Secondary | ICD-10-CM | POA: Diagnosis not present

## 2014-06-07 DIAGNOSIS — E785 Hyperlipidemia, unspecified: Secondary | ICD-10-CM | POA: Diagnosis not present

## 2014-06-07 DIAGNOSIS — E119 Type 2 diabetes mellitus without complications: Secondary | ICD-10-CM | POA: Diagnosis not present

## 2014-06-07 DIAGNOSIS — E1165 Type 2 diabetes mellitus with hyperglycemia: Secondary | ICD-10-CM

## 2014-06-07 DIAGNOSIS — Z85038 Personal history of other malignant neoplasm of large intestine: Secondary | ICD-10-CM | POA: Insufficient documentation

## 2014-06-07 DIAGNOSIS — I10 Essential (primary) hypertension: Secondary | ICD-10-CM | POA: Insufficient documentation

## 2014-06-07 DIAGNOSIS — Z794 Long term (current) use of insulin: Secondary | ICD-10-CM | POA: Insufficient documentation

## 2014-06-07 DIAGNOSIS — IMO0002 Reserved for concepts with insufficient information to code with codable children: Secondary | ICD-10-CM

## 2014-06-07 MED ORDER — FENOFIBRATE 145 MG PO TABS: 145.0000 mg | ORAL_TABLET | Freq: Every day | ORAL | Status: DC

## 2014-06-07 NOTE — Progress Notes (Signed)
Patient ID: Catherine Fowler, female   DOB: November 28, 1950, 63 y.o.   MRN: [id number]   Catherine Fowler, is a 63 y.o. female  GYI:[id number]  JJK:[id number]  DOB - 1951-07-26  Chief Complaint  Patient presents with  . Follow-up  . Diabetes  . Hypertension  . Hyperlipidemia        Subjective:   Catherine Fowler is a 63 y.o. female here today for a follow up visit. Patient is known to have diabetes mellitus requiring insulin, hypertension, and colon cancer status post resection with complicated wound dehiscent, here today for her routine follow up. She started recently on fenofibrate and pravastatin for dyslipidemia but she had a reaction to either or both of the medications which were both stopped. Today she feels much better and would like to restart the less likely of the 2 which is fenofibrate. She has no new complaint. Her blood pressure and blood sugar under control. She continues to follow with surgical team. Patient does not smoke cigarette she does not drink alcohol. Patient has No headache, No chest pain, No abdominal pain - No Nausea, No new weakness tingling or numbness, No Cough - SOB.  Problem  Dyslipidemia  Essential Hypertension    ALLERGIES: Allergies  Allergen Reactions  . Pravastatin Sodium Itching and Rash    PAST MEDICAL HISTORY: Past Medical History  Diagnosis Date  . Diabetes mellitus   . Chronic kidney disease   . Colon cancer 09/18/13    MEDICATIONS AT HOME: Prior to Admission medications   Medication Sig Start Date End Date Taking? Authorizing Provider  Insulin Glargine (LANTUS SOLOSTAR) 100 UNIT/ML Solostar Pen Inject 75 Units into the skin at bedtime. 05/10/14  Yes Angelica Chessman, MD  sulfamethoxazole-trimethoprim (BACTRIM DS) 800-160 MG per tablet Take 1 tablet by mouth 2 (two) times daily.   Yes Historical Provider, MD  DiphenhydrAMINE HCl, Sleep, (ZZZQUIL PO) Take 30 mLs by mouth daily as needed (sleep).    Historical Provider, MD  fenofibrate (TRICOR)  145 MG tablet Take 1 tablet (145 mg total) by mouth daily. 06/07/14   Angelica Chessman, MD  glucose monitoring kit (FREESTYLE) monitoring kit 1 each by Does not apply route 4 (four) times daily - after meals and at bedtime. 1 month Diabetic Testing Supplies for QAC-QHS accuchecks. Any brand okay 08/14/13   Thurnell Lose, MD  lisinopril (PRINIVIL,ZESTRIL) 10 MG tablet Take 1 tablet (10 mg total) by mouth daily. 08/14/13   Thurnell Lose, MD  Naproxen Sodium (ALEVE PO) Take 2 tablets by mouth daily as needed (pain).    Historical Provider, MD  nystatin (MYCOSTATIN/NYSTOP) 100000 UNIT/GM POWD Apply enough to cover the affected area twice per day as needed. 11/25/13   Angelica Chessman, MD     Objective:   Filed Vitals:   06/07/14 0950  BP: 154/79  Pulse: 75  Resp: 18  Height: 5' 7" (1.702 m)  Weight: 218 lb (98.884 kg)  SpO2: 99%    Exam General appearance : Awake, alert, not in any distress. Speech Clear. Not toxic looking HEENT: Atraumatic and Normocephalic, pupils equally reactive to light and accomodation Neck: supple, no JVD. No cervical lymphadenopathy.  Chest:Good air entry bilaterally, no added sounds  CVS: S1 S2 regular, no murmurs.  Abdomen: Bowel sounds present, Non tender and not distended with no gaurding, rigidity or rebound. Extremities: B/L Lower Ext shows no edema, both legs are warm to touch Neurology: Awake alert, and oriented X 3, CN II-XII intact, Non focal Skin:No Rash Wounds:N/A  Data Review Lab Results  Component Value Date   HGBA1C 6.6 05/04/2014   HGBA1C 8.4 07/15/2013   HGBA1C 9.2 05/01/2013     Assessment & Plan   1. Essential hypertension Continue lisinopril 10 mg tablet by mouth daily  2. DM (diabetes mellitus), type 2, uncontrolled Continue present insulin regimen  3. Dyslipidemia Restart, continue to hold pravastatin - fenofibrate (TRICOR) 145 MG tablet; Take 1 tablet (145 mg total) by mouth daily.  Dispense: 90 tablet; Refill:  3  Patient was counseled extensively on nutrition and exercise   Return in about 3 months (around 09/07/2014) for Hemoglobin A1C and Follow up, DM.  The patient was given clear instructions to go to ER or return to medical center if symptoms don't improve, worsen or new problems develop. The patient verbalized understanding. The patient was told to call to get lab results if they haven't heard anything in the next week.   This note has been created with Surveyor, quantity. Any transcriptional errors are unintentional.    Angelica Chessman, MD, [place], [place], [place] and [facility] [place], [place]   06/07/2014, 11:05 AM

## 2014-06-07 NOTE — Progress Notes (Signed)
Pt here to f/u DM,HTN and cholesterol States she stopped taking Statin s/p allergy reaction.  Pt was prescribed Pravastatin and Fenofibrate when allergy sx occurred;unsure of what med caused reaction Stopped taking BP medication post surgery CBG yesterday- 143

## 2014-06-07 NOTE — Patient Instructions (Signed)
Diabetes and Exercise Exercising regularly is important. It is not just about losing weight. It has many health benefits, such as:  Improving your overall fitness, flexibility, and endurance.  Increasing your bone density.  Helping with weight control.  Decreasing your body fat.  Increasing your muscle strength.  Reducing stress and tension.  Improving your overall health. People with diabetes who exercise gain additional benefits because exercise:  Reduces appetite.  Improves the body's use of blood sugar (glucose).  Helps lower or control blood glucose.  Decreases blood pressure.  Helps control blood lipids (such as cholesterol and triglycerides).  Improves the body's use of the hormone insulin by:  Increasing the body's insulin sensitivity.  Reducing the body's insulin needs.  Decreases the risk for heart disease because exercising:  Lowers cholesterol and triglycerides levels.  Increases the levels of good cholesterol (such as high-density lipoproteins [HDL]) in the body.  Lowers blood glucose levels. YOUR ACTIVITY PLAN  Choose an activity that you enjoy and set realistic goals. Your health care provider or diabetes educator can help you make an activity plan that works for you. Exercise regularly as directed by your health care provider. This includes:  Performing resistance training twice a week such as push-ups, sit-ups, lifting weights, or using resistance bands.  Performing 150 minutes of cardio exercises each week such as walking, running, or playing sports.  Staying active and spending no more than 90 minutes at one time being inactive. Even short bursts of exercise are good for you. Three 10-minute sessions spread throughout the day are just as beneficial as a single 30-minute session. Some exercise ideas include:  Taking the dog for a walk.  Taking the stairs instead of the elevator.  Dancing to your favorite song.  Doing an exercise  video.  Doing your favorite exercise with a friend. RECOMMENDATIONS FOR EXERCISING WITH TYPE 1 OR TYPE 2 DIABETES   Check your blood glucose before exercising. If blood glucose levels are greater than 240 mg/dL, check for urine ketones. Do not exercise if ketones are present.  Avoid injecting insulin into areas of the body that are going to be exercised. For example, avoid injecting insulin into:  The arms when playing tennis.  The legs when jogging.  Keep a record of:  Food intake before and after you exercise.  Expected peak times of insulin action.  Blood glucose levels before and after you exercise.  The type and amount of exercise you have done.  Review your records with your health care provider. Your health care provider will help you to develop guidelines for adjusting food intake and insulin amounts before and after exercising.  If you take insulin or oral hypoglycemic agents, watch for signs and symptoms of hypoglycemia. They include:  Dizziness.  Shaking.  Sweating.  Chills.  Confusion.  Drink plenty of water while you exercise to prevent dehydration or heat stroke. Body water is lost during exercise and must be replaced.  Talk to your health care provider before starting an exercise program to make sure it is safe for you. Remember, almost any type of activity is better than none. Document Released: 01/19/2004 Document Revised: 03/15/2014 Document Reviewed: 04/07/2013 ExitCare Patient Information 2015 [employer]. This information is not intended to replace advice given to you by your health care provider. Make sure you discuss any questions you have with your health care provider.  

## 2014-06-09 DIAGNOSIS — T8189XA Other complications of procedures, not elsewhere classified, initial encounter: Secondary | ICD-10-CM | POA: Diagnosis not present

## 2014-06-14 ENCOUNTER — Encounter (HOSPITAL_BASED_OUTPATIENT_CLINIC_OR_DEPARTMENT_OTHER): Payer: Medicare Other | Attending: General Surgery

## 2014-06-14 DIAGNOSIS — Y839 Surgical procedure, unspecified as the cause of abnormal reaction of the patient, or of later complication, without mention of misadventure at the time of the procedure: Secondary | ICD-10-CM | POA: Insufficient documentation

## 2014-06-14 DIAGNOSIS — T8189XA Other complications of procedures, not elsewhere classified, initial encounter: Secondary | ICD-10-CM | POA: Insufficient documentation

## 2014-06-18 DIAGNOSIS — Z5189 Encounter for other specified aftercare: Secondary | ICD-10-CM | POA: Diagnosis not present

## 2014-06-18 DIAGNOSIS — T8189XA Other complications of procedures, not elsewhere classified, initial encounter: Secondary | ICD-10-CM | POA: Diagnosis not present

## 2014-06-18 DIAGNOSIS — C189 Malignant neoplasm of colon, unspecified: Secondary | ICD-10-CM | POA: Diagnosis not present

## 2014-06-18 DIAGNOSIS — Z483 Aftercare following surgery for neoplasm: Secondary | ICD-10-CM | POA: Diagnosis not present

## 2014-06-23 DIAGNOSIS — T8189XA Other complications of procedures, not elsewhere classified, initial encounter: Secondary | ICD-10-CM | POA: Diagnosis not present

## 2014-06-30 DIAGNOSIS — T8189XA Other complications of procedures, not elsewhere classified, initial encounter: Secondary | ICD-10-CM | POA: Diagnosis not present

## 2014-07-07 DIAGNOSIS — T8189XA Other complications of procedures, not elsewhere classified, initial encounter: Secondary | ICD-10-CM | POA: Diagnosis not present

## 2014-07-13 ENCOUNTER — Encounter (HOSPITAL_BASED_OUTPATIENT_CLINIC_OR_DEPARTMENT_OTHER): Payer: Medicare Other | Attending: General Surgery

## 2014-07-13 DIAGNOSIS — T8189XA Other complications of procedures, not elsewhere classified, initial encounter: Secondary | ICD-10-CM | POA: Diagnosis not present

## 2014-07-13 DIAGNOSIS — Y849 Medical procedure, unspecified as the cause of abnormal reaction of the patient, or of later complication, without mention of misadventure at the time of the procedure: Secondary | ICD-10-CM | POA: Diagnosis not present

## 2014-07-21 DIAGNOSIS — T8189XA Other complications of procedures, not elsewhere classified, initial encounter: Secondary | ICD-10-CM | POA: Diagnosis not present

## 2014-07-27 DIAGNOSIS — T8189XA Other complications of procedures, not elsewhere classified, initial encounter: Secondary | ICD-10-CM | POA: Diagnosis not present

## 2014-07-30 DIAGNOSIS — T8189XA Other complications of procedures, not elsewhere classified, initial encounter: Secondary | ICD-10-CM | POA: Diagnosis not present

## 2014-07-30 DIAGNOSIS — C189 Malignant neoplasm of colon, unspecified: Secondary | ICD-10-CM | POA: Diagnosis not present

## 2014-07-30 DIAGNOSIS — Z5189 Encounter for other specified aftercare: Secondary | ICD-10-CM | POA: Diagnosis not present

## 2014-07-30 DIAGNOSIS — Z483 Aftercare following surgery for neoplasm: Secondary | ICD-10-CM | POA: Diagnosis not present

## 2014-08-04 DIAGNOSIS — T8189XA Other complications of procedures, not elsewhere classified, initial encounter: Secondary | ICD-10-CM | POA: Diagnosis not present

## 2014-08-10 ENCOUNTER — Ambulatory Visit: Payer: Medicare Other | Attending: Internal Medicine | Admitting: *Deleted

## 2014-08-10 DIAGNOSIS — I10 Essential (primary) hypertension: Secondary | ICD-10-CM

## 2014-08-10 DIAGNOSIS — Z0000 Encounter for general adult medical examination without abnormal findings: Secondary | ICD-10-CM

## 2014-08-10 DIAGNOSIS — Z23 Encounter for immunization: Secondary | ICD-10-CM | POA: Diagnosis not present

## 2014-08-10 LAB — CMP AND LIVER
ALBUMIN: 3.9 g/dL (ref 3.5–5.2)
ALT: 16 U/L (ref 0–35)
AST: 17 U/L (ref 0–37)
Alkaline Phosphatase: 95 U/L (ref 39–117)
BUN: 15 mg/dL (ref 6–23)
CALCIUM: 9.2 mg/dL (ref 8.4–10.5)
CO2: 22 meq/L (ref 19–32)
CREATININE: 1.22 mg/dL — AB (ref 0.50–1.10)
Chloride: 105 mEq/L (ref 96–112)
Glucose, Bld: 210 mg/dL — ABNORMAL HIGH (ref 70–99)
POTASSIUM: 4.6 meq/L (ref 3.5–5.3)
Sodium: 141 mEq/L (ref 135–145)
TOTAL PROTEIN: 6.4 g/dL (ref 6.0–8.3)
Total Bilirubin: 0.3 mg/dL (ref 0.2–1.2)

## 2014-08-10 LAB — CBC WITH DIFFERENTIAL/PLATELET
Basophils Absolute: 0.1 10*3/uL (ref 0.0–0.1)
Basophils Relative: 1 % (ref 0–1)
EOS PCT: 2 % (ref 0–5)
Eosinophils Absolute: 0.1 10*3/uL (ref 0.0–0.7)
HEMATOCRIT: 30.6 % — AB (ref 36.0–46.0)
Hemoglobin: 8.9 g/dL — ABNORMAL LOW (ref 12.0–15.0)
LYMPHS ABS: 1.9 10*3/uL (ref 0.7–4.0)
Lymphocytes Relative: 31 % (ref 12–46)
MCH: 19.2 pg — ABNORMAL LOW (ref 26.0–34.0)
MCHC: 29.1 g/dL — ABNORMAL LOW (ref 30.0–36.0)
MCV: 65.9 fL — AB (ref 78.0–100.0)
Monocytes Absolute: 0.5 10*3/uL (ref 0.1–1.0)
Monocytes Relative: 8 % (ref 3–12)
NEUTROS ABS: 3.5 10*3/uL (ref 1.7–7.7)
Neutrophils Relative %: 58 % (ref 43–77)
Platelets: 302 10*3/uL (ref 150–400)
RBC: 4.64 MIL/uL (ref 3.87–5.11)
RDW: 19.6 % — ABNORMAL HIGH (ref 11.5–15.5)
WBC: 6 10*3/uL (ref 4.0–10.5)

## 2014-08-18 ENCOUNTER — Encounter (HOSPITAL_BASED_OUTPATIENT_CLINIC_OR_DEPARTMENT_OTHER): Payer: Medicare Other | Attending: General Surgery

## 2014-08-18 DIAGNOSIS — T8189XD Other complications of procedures, not elsewhere classified, subsequent encounter: Secondary | ICD-10-CM | POA: Insufficient documentation

## 2014-08-26 ENCOUNTER — Other Ambulatory Visit: Payer: Medicare Other

## 2014-09-09 ENCOUNTER — Encounter: Payer: Self-pay | Admitting: Internal Medicine

## 2014-09-09 ENCOUNTER — Ambulatory Visit: Payer: Medicare Other | Attending: Internal Medicine | Admitting: Internal Medicine

## 2014-09-09 VITALS — BP 154/78 | HR 74 | Temp 98.7°F | Resp 16 | Ht 67.0 in | Wt 232.0 lb

## 2014-09-09 DIAGNOSIS — I129 Hypertensive chronic kidney disease with stage 1 through stage 4 chronic kidney disease, or unspecified chronic kidney disease: Secondary | ICD-10-CM | POA: Insufficient documentation

## 2014-09-09 DIAGNOSIS — N189 Chronic kidney disease, unspecified: Secondary | ICD-10-CM | POA: Insufficient documentation

## 2014-09-09 DIAGNOSIS — I10 Essential (primary) hypertension: Secondary | ICD-10-CM

## 2014-09-09 DIAGNOSIS — E119 Type 2 diabetes mellitus without complications: Secondary | ICD-10-CM | POA: Insufficient documentation

## 2014-09-09 DIAGNOSIS — Z85038 Personal history of other malignant neoplasm of large intestine: Secondary | ICD-10-CM | POA: Diagnosis not present

## 2014-09-09 DIAGNOSIS — Z794 Long term (current) use of insulin: Secondary | ICD-10-CM | POA: Insufficient documentation

## 2014-09-09 DIAGNOSIS — E785 Hyperlipidemia, unspecified: Secondary | ICD-10-CM | POA: Insufficient documentation

## 2014-09-09 LAB — GLUCOSE, POCT (MANUAL RESULT ENTRY): POC Glucose: 179 mg/dl — AB (ref 70–99)

## 2014-09-09 LAB — POCT GLYCOSYLATED HEMOGLOBIN (HGB A1C): HEMOGLOBIN A1C: 8.7

## 2014-09-09 NOTE — Progress Notes (Signed)
Pt is here following up on her diabetes. Pt has a history of kidney disease and colon cancer. Pt has no c/o today.

## 2014-09-09 NOTE — Patient Instructions (Signed)
DASH Eating Plan DASH stands for "Dietary Approaches to Stop Hypertension." The DASH eating plan is a healthy eating plan that has been shown to reduce high blood pressure (hypertension). Additional health benefits may include reducing the risk of type 2 diabetes mellitus, heart disease, and stroke. The DASH eating plan may also help with weight loss. WHAT DO I NEED TO KNOW ABOUT THE DASH EATING PLAN? For the DASH eating plan, you will follow these general guidelines:  Choose foods with a percent daily value for sodium of less than 5% (as listed on the food label).  Use salt-free seasonings or herbs instead of table salt or sea salt.  Check with your health care provider or pharmacist before using salt substitutes.  Eat lower-sodium products, often labeled as "lower sodium" or "no salt added."  Eat fresh foods.  Eat more vegetables, fruits, and low-fat dairy products.  Choose whole grains. Look for the word "whole" as the first word in the ingredient list.  Choose fish and skinless chicken or turkey more often than red meat. Limit fish, poultry, and meat to 6 oz (170 g) each day.  Limit sweets, desserts, sugars, and sugary drinks.  Choose heart-healthy fats.  Limit cheese to 1 oz (28 g) per day.  Eat more home-cooked food and less restaurant, buffet, and fast food.  Limit fried foods.  Cook foods using methods other than frying.  Limit canned vegetables. If you do use them, rinse them well to decrease the sodium.  When eating at a restaurant, ask that your food be prepared with less salt, or no salt if possible. WHAT FOODS CAN I EAT? Seek help from a dietitian for individual calorie needs. Grains Whole grain or whole wheat bread. Brown rice. Whole grain or whole wheat pasta. Quinoa, bulgur, and whole grain cereals. Low-sodium cereals. Corn or whole wheat flour tortillas. Whole grain cornbread. Whole grain crackers. Low-sodium crackers. Vegetables Fresh or frozen vegetables  (raw, steamed, roasted, or grilled). Low-sodium or reduced-sodium tomato and vegetable juices. Low-sodium or reduced-sodium tomato sauce and paste. Low-sodium or reduced-sodium canned vegetables.  Fruits All fresh, canned (in natural juice), or frozen fruits. Meat and Other Protein Products Ground beef (85% or leaner), grass-fed beef, or beef trimmed of fat. Skinless chicken or turkey. Ground chicken or turkey. Pork trimmed of fat. All fish and seafood. Eggs. Dried beans, peas, or lentils. Unsalted nuts and seeds. Unsalted canned beans. Dairy Low-fat dairy products, such as skim or 1% milk, 2% or reduced-fat cheeses, low-fat ricotta or cottage cheese, or plain low-fat yogurt. Low-sodium or reduced-sodium cheeses. Fats and Oils Tub margarines without trans fats. Light or reduced-fat mayonnaise and salad dressings (reduced sodium). Avocado. Safflower, olive, or canola oils. Natural peanut or almond butter. Other Unsalted popcorn and pretzels. The items listed above may not be a complete list of recommended foods or beverages. Contact your dietitian for more options. WHAT FOODS ARE NOT RECOMMENDED? Grains White bread. White pasta. White rice. Refined cornbread. Bagels and croissants. Crackers that contain trans fat. Vegetables Creamed or fried vegetables. Vegetables in a cheese sauce. Regular canned vegetables. Regular canned tomato sauce and paste. Regular tomato and vegetable juices. Fruits Dried fruits. Canned fruit in light or heavy syrup. Fruit juice. Meat and Other Protein Products Fatty cuts of meat. Ribs, chicken wings, bacon, sausage, bologna, salami, chitterlings, fatback, hot dogs, bratwurst, and packaged luncheon meats. Salted nuts and seeds. Canned beans with salt. Dairy Whole or 2% milk, cream, half-and-half, and cream cheese. Whole-fat or sweetened yogurt. Full-fat   cheeses or blue cheese. Nondairy creamers and whipped toppings. Processed cheese, cheese spreads, or cheese  curds. Condiments Onion and garlic salt, seasoned salt, table salt, and sea salt. Canned and packaged gravies. Worcestershire sauce. Tartar sauce. Barbecue sauce. Teriyaki sauce. Soy sauce, including reduced sodium. Steak sauce. Fish sauce. Oyster sauce. Cocktail sauce. Horseradish. Ketchup and mustard. Meat flavorings and tenderizers. Bouillon cubes. Hot sauce. Tabasco sauce. Marinades. Taco seasonings. Relishes. Fats and Oils Butter, stick margarine, lard, shortening, ghee, and bacon fat. Coconut, palm kernel, or palm oils. Regular salad dressings. Other Pickles and olives. Salted popcorn and pretzels. The items listed above may not be a complete list of foods and beverages to avoid. Contact your dietitian for more information. WHERE CAN I FIND MORE INFORMATION? [employer], [employer], [employer]: www.nhlbi.nih.gov/health/health-topics/topics/dash/ Document Released: 10/18/2011 Document Revised: 03/15/2014 Document Reviewed: 09/02/2013 Exit[employer] Patient Information 2015 [employer]. This information is not intended to replace advice given to you by your health care provider. Make sure you discuss any questions you have with your health care provider. Diabetes and Exercise Exercising regularly is important. It is not just about losing weight. It has many health benefits, such as:  Improving your overall fitness, flexibility, and endurance.  Increasing your bone density.  Helping with weight control.  Decreasing your body fat.  Increasing your muscle strength.  Reducing stress and tension.  Improving your overall health. People with diabetes who exercise gain additional benefits because exercise:  Reduces appetite.  Improves the body's use of blood sugar (glucose).  Helps lower or control blood glucose.  Decreases blood pressure.  Helps control blood lipids (such as cholesterol and triglycerides).  Improves the body's use of the hormone insulin by:  Increasing the  body's insulin sensitivity.  Reducing the body's insulin needs.  Decreases the risk for heart disease because exercising:  Lowers cholesterol and triglycerides levels.  Increases the levels of good cholesterol (such as high-density lipoproteins [HDL]) in the body.  Lowers blood glucose levels. YOUR ACTIVITY PLAN  Choose an activity that you enjoy and set realistic goals. Your health care provider or diabetes educator can help you make an activity plan that works for you. Exercise regularly as directed by your health care provider. This includes:  Performing resistance training twice a week such as push-ups, sit-ups, lifting weights, or using resistance bands.  Performing 150 minutes of cardio exercises each week such as walking, running, or playing sports.  Staying active and spending no more than 90 minutes at one time being inactive. Even short bursts of exercise are good for you. Three 10-minute sessions spread throughout the day are just as beneficial as a single 30-minute session. Some exercise ideas include:  Taking the dog for a walk.  Taking the stairs instead of the elevator.  Dancing to your favorite song.  Doing an exercise video.  Doing your favorite exercise with a friend. RECOMMENDATIONS FOR EXERCISING WITH TYPE 1 OR TYPE 2 DIABETES   Check your blood glucose before exercising. If blood glucose levels are greater than 240 mg/dL, check for urine ketones. Do not exercise if ketones are present.  Avoid injecting insulin into areas of the body that are going to be exercised. For example, avoid injecting insulin into:  The arms when playing tennis.  The legs when jogging.  Keep a record of:  Food intake before and after you exercise.  Expected peak times of insulin action.  Blood glucose levels before and after you exercise.  The type and amount of exercise   you have done.  Review your records with your health care provider. Your health care provider will  help you to develop guidelines for adjusting food intake and insulin amounts before and after exercising.  If you take insulin or oral hypoglycemic agents, watch for signs and symptoms of hypoglycemia. They include:  Dizziness.  Shaking.  Sweating.  Chills.  Confusion.  Drink plenty of water while you exercise to prevent dehydration or heat stroke. Body water is lost during exercise and must be replaced.  Talk to your health care provider before starting an exercise program to make sure it is safe for you. Remember, almost any type of activity is better than none. Document Released: 01/19/2004 Document Revised: 03/15/2014 Document Reviewed: 04/07/2013 Exit[employer] Patient Information 2015 [employer]. This information is not intended to replace advice given to you by your health care provider. Make sure you discuss any questions you have with your health care provider. Diabetes Mellitus and Food It is important for you to manage your blood sugar (glucose) level. Your blood glucose level can be greatly affected by what you eat. Eating healthier foods in the appropriate amounts throughout the day at about the same time each day will help you control your blood glucose level. It can also help slow or prevent worsening of your diabetes mellitus. Healthy eating may even help you improve the level of your blood pressure and reach or maintain a healthy weight.  HOW CAN FOOD AFFECT ME? Carbohydrates Carbohydrates affect your blood glucose level more than any other type of food. Your dietitian will help you determine how many carbohydrates to eat at each meal and teach you how to count carbohydrates. Counting carbohydrates is important to keep your blood glucose at a healthy level, especially if you are using insulin or taking certain medicines for diabetes mellitus. Alcohol Alcohol can cause sudden decreases in blood glucose (hypoglycemia), especially if you use insulin or take certain medicines for  diabetes mellitus. Hypoglycemia can be a life-threatening condition. Symptoms of hypoglycemia (sleepiness, dizziness, and disorientation) are similar to symptoms of having too much alcohol.  If your health care provider has given you approval to drink alcohol, do so in moderation and use the following guidelines:  Women should not have more than one drink per day, and men should not have more than two drinks per day. One drink is equal to:  12 oz of beer.  5 oz of wine.  1 oz of hard liquor.  Do not drink on an empty stomach.  Keep yourself hydrated. Have water, diet soda, or unsweetened iced tea.  Regular soda, juice, and other mixers might contain a lot of carbohydrates and should be counted. WHAT FOODS ARE NOT RECOMMENDED? As you make food choices, it is important to remember that all foods are not the same. Some foods have fewer nutrients per serving than other foods, even though they might have the same number of calories or carbohydrates. It is difficult to get your body what it needs when you eat foods with fewer nutrients. Examples of foods that you should avoid that are high in calories and carbohydrates but low in nutrients include:  Trans fats (most processed foods list trans fats on the Nutrition Facts label).  Regular soda.  Juice.  Candy.  Sweets, such as cake, pie, doughnuts, and cookies.  Fried foods. WHAT FOODS CAN I EAT? Have nutrient-rich foods, which will nourish your body and keep you healthy. The food you should eat also will depend on several factors,   including:  The calories you need.  The medicines you take.  Your weight.  Your blood glucose level.  Your blood pressure level.  Your cholesterol level. You also should eat a variety of foods, including:  Protein, such as meat, poultry, fish, tofu, nuts, and seeds (lean animal proteins are best).  Fruits.  Vegetables.  Dairy products, such as milk, cheese, and yogurt (low fat is  best).  Breads, grains, pasta, cereal, rice, and beans.  Fats such as olive oil, trans fat-free margarine, canola oil, avocado, and olives. DOES EVERYONE WITH DIABETES MELLITUS HAVE THE SAME MEAL PLAN? Because every person with diabetes mellitus is different, there is not one meal plan that works for everyone. It is very important that you meet with a dietitian who will help you create a meal plan that is just right for you. Document Released: 07/26/2005 Document Revised: 11/03/2013 Document Reviewed: 09/25/2013 Exit[employer] Patient Information 2015 [employer]. This information is not intended to replace advice given to you by your health care provider. Make sure you discuss any questions you have with your health care provider.  

## 2014-09-14 ENCOUNTER — Encounter (HOSPITAL_BASED_OUTPATIENT_CLINIC_OR_DEPARTMENT_OTHER): Payer: Medicare Other | Attending: General Surgery

## 2014-09-14 DIAGNOSIS — Y839 Surgical procedure, unspecified as the cause of abnormal reaction of the patient, or of later complication, without mention of misadventure at the time of the procedure: Secondary | ICD-10-CM | POA: Diagnosis not present

## 2014-09-14 DIAGNOSIS — T8189XD Other complications of procedures, not elsewhere classified, subsequent encounter: Secondary | ICD-10-CM | POA: Insufficient documentation

## 2014-09-14 DIAGNOSIS — S31109D Unspecified open wound of abdominal wall, unspecified quadrant without penetration into peritoneal cavity, subsequent encounter: Secondary | ICD-10-CM | POA: Insufficient documentation

## 2014-09-14 NOTE — H&P (Signed)
[name], Catherine Fowler NO.:  [id number]  MEDICAL RECORD NO.:  [id number]  LOCATION:  FOOT                         FACILITY:  [facility]  PHYSICIAN:  Elesa Hacker, M.D.        DATE OF BIRTH:  12/12/1950  DATE OF ADMISSION:  09/14/2014 DATE OF DISCHARGE:                             HISTORY & PHYSICAL   CHIEF COMPLAINT:  Drainage from abdominal wound.  HISTORY OF PRESENT ILLNESS:  This is a 63 year old woman type 2 diabetic.  She had a large midline incision done many years ago, which time, she had cholecystectomy, tubal ligation, operation for obesity and several other procedures.  This wound was used again approximately year and a half ago when she had cancer of the sigmoid colon and a globe opening was made in the lower part of the wound, this has failed to heal.  It was healed approximately 6 months ago after seeing Dr. Jerline Pain for several months, but broke down today with pussy discharge and some tenderness surrounding the wound.  She has had no systemic symptoms.  PAST MEDICAL HISTORY:  Significant for operations for obesity, anemia, sigmoid cancer.  PAST SURGICAL HISTORY:  Sigmoid cancer surgery, gastric bypass, rhinoplasty, breast reduction and ileum bypass in 1976.  SOCIAL HISTORY:  Cigarettes none.  Alcohol none.  MEDICATIONS:  Cipro, Cleocin, neither of which she is taking now, ZzzQuil, ferrous sulfate, Vicodin, NovoLog, Lantus, Prinivil, and Mycostatin powder.  REVIEW OF SYSTEMS:  As above.  ALLERGIES:  None.  PHYSICAL EXAMINATION:  VITAL SIGNS:  Temperature 98.4, pulse 85, respirations 20, blood pressure 163/61.  Glucose is 156. GENERAL APPEARANCE:  Well developed, well nourished, no distress. CHEST:  Clear. HEART:  Regular rhythm in the lower part of the abdominal surgical wound.  There is a 0.7 x 0.4 opening, which probes the 5 cm.  IMPRESSION:  Unhealed postop surgical wound.  PLAN OF TREATMENT:  We have packed it with iodoform and we will  pack twice a week.  I have restarted Bactrim and a culture was taken.     Elesa Hacker, M.D.     RA/MEDQ  D:  09/14/2014  T:  09/14/2014  Job:  [id number]

## 2014-09-17 DIAGNOSIS — T8189XD Other complications of procedures, not elsewhere classified, subsequent encounter: Secondary | ICD-10-CM | POA: Diagnosis not present

## 2014-09-21 DIAGNOSIS — S31109D Unspecified open wound of abdominal wall, unspecified quadrant without penetration into peritoneal cavity, subsequent encounter: Secondary | ICD-10-CM | POA: Diagnosis not present

## 2014-09-21 DIAGNOSIS — T8189XD Other complications of procedures, not elsewhere classified, subsequent encounter: Secondary | ICD-10-CM | POA: Diagnosis not present

## 2014-09-24 DIAGNOSIS — T8189XD Other complications of procedures, not elsewhere classified, subsequent encounter: Secondary | ICD-10-CM | POA: Diagnosis not present

## 2014-09-28 DIAGNOSIS — T8189XD Other complications of procedures, not elsewhere classified, subsequent encounter: Secondary | ICD-10-CM | POA: Diagnosis not present

## 2014-09-28 DIAGNOSIS — S31109D Unspecified open wound of abdominal wall, unspecified quadrant without penetration into peritoneal cavity, subsequent encounter: Secondary | ICD-10-CM | POA: Diagnosis not present

## 2014-10-01 DIAGNOSIS — T8189XD Other complications of procedures, not elsewhere classified, subsequent encounter: Secondary | ICD-10-CM | POA: Diagnosis not present

## 2014-10-05 DIAGNOSIS — S31109D Unspecified open wound of abdominal wall, unspecified quadrant without penetration into peritoneal cavity, subsequent encounter: Secondary | ICD-10-CM | POA: Diagnosis not present

## 2014-10-05 DIAGNOSIS — T8189XD Other complications of procedures, not elsewhere classified, subsequent encounter: Secondary | ICD-10-CM | POA: Diagnosis not present

## 2014-10-12 ENCOUNTER — Encounter (HOSPITAL_BASED_OUTPATIENT_CLINIC_OR_DEPARTMENT_OTHER): Payer: Medicare Other | Attending: General Surgery

## 2014-10-12 DIAGNOSIS — S31109A Unspecified open wound of abdominal wall, unspecified quadrant without penetration into peritoneal cavity, initial encounter: Secondary | ICD-10-CM | POA: Diagnosis not present

## 2014-10-12 DIAGNOSIS — Y839 Surgical procedure, unspecified as the cause of abnormal reaction of the patient, or of later complication, without mention of misadventure at the time of the procedure: Secondary | ICD-10-CM | POA: Insufficient documentation

## 2014-10-12 DIAGNOSIS — T8189XA Other complications of procedures, not elsewhere classified, initial encounter: Secondary | ICD-10-CM | POA: Insufficient documentation

## 2014-10-15 DIAGNOSIS — T8189XA Other complications of procedures, not elsewhere classified, initial encounter: Secondary | ICD-10-CM | POA: Diagnosis not present

## 2014-10-19 DIAGNOSIS — T8189XA Other complications of procedures, not elsewhere classified, initial encounter: Secondary | ICD-10-CM | POA: Diagnosis not present

## 2014-10-22 DIAGNOSIS — Z9884 Bariatric surgery status: Secondary | ICD-10-CM | POA: Diagnosis not present

## 2014-10-22 DIAGNOSIS — T8131XD Disruption of external operation (surgical) wound, not elsewhere classified, subsequent encounter: Secondary | ICD-10-CM | POA: Diagnosis not present

## 2014-10-22 DIAGNOSIS — Z85038 Personal history of other malignant neoplasm of large intestine: Secondary | ICD-10-CM | POA: Diagnosis not present

## 2014-10-22 DIAGNOSIS — T8189XD Other complications of procedures, not elsewhere classified, subsequent encounter: Secondary | ICD-10-CM | POA: Diagnosis not present

## 2014-10-22 DIAGNOSIS — C189 Malignant neoplasm of colon, unspecified: Secondary | ICD-10-CM | POA: Diagnosis not present

## 2014-10-22 DIAGNOSIS — Z980 Intestinal bypass and anastomosis status: Secondary | ICD-10-CM | POA: Diagnosis not present

## 2014-10-25 DIAGNOSIS — T8189XA Other complications of procedures, not elsewhere classified, initial encounter: Secondary | ICD-10-CM | POA: Diagnosis not present

## 2014-10-27 DIAGNOSIS — S31109A Unspecified open wound of abdominal wall, unspecified quadrant without penetration into peritoneal cavity, initial encounter: Secondary | ICD-10-CM | POA: Diagnosis not present

## 2014-10-27 DIAGNOSIS — T8189XA Other complications of procedures, not elsewhere classified, initial encounter: Secondary | ICD-10-CM | POA: Diagnosis not present

## 2014-10-29 DIAGNOSIS — T8189XA Other complications of procedures, not elsewhere classified, initial encounter: Secondary | ICD-10-CM | POA: Diagnosis not present

## 2014-11-01 DIAGNOSIS — T8189XA Other complications of procedures, not elsewhere classified, initial encounter: Secondary | ICD-10-CM | POA: Diagnosis not present

## 2014-11-03 ENCOUNTER — Other Ambulatory Visit: Payer: Self-pay | Admitting: Emergency Medicine

## 2014-11-03 DIAGNOSIS — T8189XA Other complications of procedures, not elsewhere classified, initial encounter: Secondary | ICD-10-CM | POA: Diagnosis not present

## 2014-11-03 MED ORDER — INSULIN GLARGINE 100 UNIT/ML SOLOSTAR PEN: 75.0000 [IU] | PEN_INJECTOR | Freq: Every day | SUBCUTANEOUS | Status: DC

## 2014-11-10 DIAGNOSIS — T8189XA Other complications of procedures, not elsewhere classified, initial encounter: Secondary | ICD-10-CM | POA: Diagnosis not present

## 2014-11-15 ENCOUNTER — Encounter (HOSPITAL_BASED_OUTPATIENT_CLINIC_OR_DEPARTMENT_OTHER): Payer: PPO | Attending: General Surgery

## 2014-11-15 DIAGNOSIS — S31109D Unspecified open wound of abdominal wall, unspecified quadrant without penetration into peritoneal cavity, subsequent encounter: Secondary | ICD-10-CM | POA: Diagnosis not present

## 2014-11-15 DIAGNOSIS — T8189XD Other complications of procedures, not elsewhere classified, subsequent encounter: Secondary | ICD-10-CM | POA: Insufficient documentation

## 2014-11-15 DIAGNOSIS — Y839 Surgical procedure, unspecified as the cause of abnormal reaction of the patient, or of later complication, without mention of misadventure at the time of the procedure: Secondary | ICD-10-CM | POA: Insufficient documentation

## 2014-11-17 DIAGNOSIS — S31109A Unspecified open wound of abdominal wall, unspecified quadrant without penetration into peritoneal cavity, initial encounter: Secondary | ICD-10-CM | POA: Diagnosis not present

## 2014-11-17 DIAGNOSIS — S31109D Unspecified open wound of abdominal wall, unspecified quadrant without penetration into peritoneal cavity, subsequent encounter: Secondary | ICD-10-CM | POA: Diagnosis not present

## 2014-11-17 DIAGNOSIS — T8189XD Other complications of procedures, not elsewhere classified, subsequent encounter: Secondary | ICD-10-CM | POA: Diagnosis not present

## 2014-11-23 DIAGNOSIS — S31109D Unspecified open wound of abdominal wall, unspecified quadrant without penetration into peritoneal cavity, subsequent encounter: Secondary | ICD-10-CM | POA: Diagnosis not present

## 2014-11-23 DIAGNOSIS — T8189XD Other complications of procedures, not elsewhere classified, subsequent encounter: Secondary | ICD-10-CM | POA: Diagnosis not present

## 2014-11-26 ENCOUNTER — Telehealth: Payer: Self-pay | Admitting: Internal Medicine

## 2014-11-26 DIAGNOSIS — C189 Malignant neoplasm of colon, unspecified: Secondary | ICD-10-CM | POA: Diagnosis not present

## 2014-11-26 DIAGNOSIS — T8189XD Other complications of procedures, not elsewhere classified, subsequent encounter: Secondary | ICD-10-CM | POA: Diagnosis not present

## 2014-11-26 NOTE — Telephone Encounter (Signed)
Left voice message to return call 

## 2014-11-26 NOTE — Telephone Encounter (Signed)
Patient called stating that she is having surgery soon and was told that she needed to get her A1C under control before she could have surgery. Please f/u with pt.

## 2014-11-30 DIAGNOSIS — T8189XD Other complications of procedures, not elsewhere classified, subsequent encounter: Secondary | ICD-10-CM | POA: Diagnosis not present

## 2014-11-30 DIAGNOSIS — S31109D Unspecified open wound of abdominal wall, unspecified quadrant without penetration into peritoneal cavity, subsequent encounter: Secondary | ICD-10-CM | POA: Diagnosis not present

## 2014-12-06 ENCOUNTER — Encounter: Payer: Self-pay | Admitting: Internal Medicine

## 2014-12-06 ENCOUNTER — Ambulatory Visit: Payer: PPO | Attending: Internal Medicine | Admitting: Internal Medicine

## 2014-12-06 VITALS — BP 166/89 | HR 85 | Temp 99.6°F | Ht 67.0 in | Wt 233.0 lb

## 2014-12-06 DIAGNOSIS — Z794 Long term (current) use of insulin: Secondary | ICD-10-CM | POA: Insufficient documentation

## 2014-12-06 DIAGNOSIS — E139 Other specified diabetes mellitus without complications: Secondary | ICD-10-CM | POA: Diagnosis not present

## 2014-12-06 DIAGNOSIS — I129 Hypertensive chronic kidney disease with stage 1 through stage 4 chronic kidney disease, or unspecified chronic kidney disease: Secondary | ICD-10-CM | POA: Diagnosis not present

## 2014-12-06 DIAGNOSIS — N189 Chronic kidney disease, unspecified: Secondary | ICD-10-CM | POA: Diagnosis not present

## 2014-12-06 LAB — COMPLETE METABOLIC PANEL WITH GFR
ALBUMIN: 4.1 g/dL (ref 3.5–5.2)
ALT: 27 U/L (ref 0–35)
AST: 27 U/L (ref 0–37)
Alkaline Phosphatase: 126 U/L — ABNORMAL HIGH (ref 39–117)
BUN: 23 mg/dL (ref 6–23)
CALCIUM: 9.6 mg/dL (ref 8.4–10.5)
CO2: 24 meq/L (ref 19–32)
Chloride: 100 mEq/L (ref 96–112)
Creat: 1.14 mg/dL — ABNORMAL HIGH (ref 0.50–1.10)
GFR, EST AFRICAN AMERICAN: 58 mL/min — AB
GFR, Est Non African American: 51 mL/min — ABNORMAL LOW
Glucose, Bld: 280 mg/dL — ABNORMAL HIGH (ref 70–99)
Potassium: 3.9 mEq/L (ref 3.5–5.3)
Sodium: 137 mEq/L (ref 135–145)
Total Bilirubin: 0.3 mg/dL (ref 0.2–1.2)
Total Protein: 7.1 g/dL (ref 6.0–8.3)

## 2014-12-06 LAB — LIPID PANEL
CHOL/HDL RATIO: 6 ratio
CHOLESTEROL: 263 mg/dL — AB (ref 0–200)
HDL: 44 mg/dL (ref >39)
TRIGLYCERIDES: 934 mg/dL — AB (ref <150)

## 2014-12-06 LAB — CBC WITH DIFFERENTIAL/PLATELET
BASOS PCT: 0 % (ref 0–1)
Basophils Absolute: 0 10*3/uL (ref 0.0–0.1)
EOS ABS: 0.1 10*3/uL (ref 0.0–0.7)
Eosinophils Relative: 1 % (ref 0–5)
HEMATOCRIT: 36.2 % (ref 36.0–46.0)
HEMOGLOBIN: 11.1 g/dL — AB (ref 12.0–15.0)
Lymphocytes Relative: 25 % (ref 12–46)
Lymphs Abs: 2.5 10*3/uL (ref 0.7–4.0)
MCH: 21.1 pg — ABNORMAL LOW (ref 26.0–34.0)
MCHC: 30.7 g/dL (ref 30.0–36.0)
MCV: 69 fL — AB (ref 78.0–100.0)
MONOS PCT: 4 % (ref 3–12)
MPV: 9.7 fL (ref 8.6–12.4)
Monocytes Absolute: 0.4 10*3/uL (ref 0.1–1.0)
Neutro Abs: 6.9 10*3/uL (ref 1.7–7.7)
Neutrophils Relative %: 70 % (ref 43–77)
Platelets: 359 10*3/uL (ref 150–400)
RBC: 5.25 MIL/uL — ABNORMAL HIGH (ref 3.87–5.11)
RDW: 18.9 % — ABNORMAL HIGH (ref 11.5–15.5)
WBC: 9.8 10*3/uL (ref 4.0–10.5)

## 2014-12-06 LAB — POCT URINALYSIS DIPSTICK
Bilirubin, UA: NEGATIVE
Glucose, UA: 500
Leukocytes, UA: NEGATIVE
NITRITE UA: NEGATIVE
PH UA: 5.5
PROTEIN UA: 100
RBC UA: NEGATIVE
Spec Grav, UA: 1.02
Urobilinogen, UA: 0.2

## 2014-12-06 LAB — GLUCOSE, POCT (MANUAL RESULT ENTRY): POC GLUCOSE: 311 mg/dL — AB (ref 70–99)

## 2014-12-06 LAB — POCT GLYCOSYLATED HEMOGLOBIN (HGB A1C): Hemoglobin A1C: 9.7

## 2014-12-06 MED ORDER — INSULIN ASPART 100 UNIT/ML ~~LOC~~ SOLN
10.0000 [IU] | Freq: Once | SUBCUTANEOUS | Status: AC
  Administered 2014-12-06: 10 [IU] via SUBCUTANEOUS

## 2014-12-06 NOTE — Progress Notes (Signed)
Pt is here to discuss issues with wound healing. She is needing additional surgery for her wound, but needs clearance for this.  Pt glucose was 311.

## 2014-12-06 NOTE — Patient Instructions (Signed)
DASH Eating Plan DASH stands for "Dietary Approaches to Stop Hypertension." The DASH eating plan is a healthy eating plan that has been shown to reduce high blood pressure (hypertension). Additional health benefits may include reducing the risk of type 2 diabetes mellitus, heart disease, and stroke. The DASH eating plan may also help with weight loss. WHAT DO I NEED TO KNOW ABOUT THE DASH EATING PLAN? For the DASH eating plan, you will follow these general guidelines:  Choose foods with a percent daily value for sodium of less than 5% (as listed on the food label).  Use salt-free seasonings or herbs instead of table salt or sea salt.  Check with your health care provider or pharmacist before using salt substitutes.  Eat lower-sodium products, often labeled as "lower sodium" or "no salt added."  Eat fresh foods.  Eat more vegetables, fruits, and low-fat dairy products.  Choose whole grains. Look for the word "whole" as the first word in the ingredient list.  Choose fish and skinless chicken or turkey more often than red meat. Limit fish, poultry, and meat to 6 oz (170 g) each day.  Limit sweets, desserts, sugars, and sugary drinks.  Choose heart-healthy fats.  Limit cheese to 1 oz (28 g) per day.  Eat more home-cooked food and less restaurant, buffet, and fast food.  Limit fried foods.  Cook foods using methods other than frying.  Limit canned vegetables. If you do use them, rinse them well to decrease the sodium.  When eating at a restaurant, ask that your food be prepared with less salt, or no salt if possible. WHAT FOODS CAN I EAT? Seek help from a dietitian for individual calorie needs. Grains Whole grain or whole wheat bread. Brown rice. Whole grain or whole wheat pasta. Quinoa, bulgur, and whole grain cereals. Low-sodium cereals. Corn or whole wheat flour tortillas. Whole grain cornbread. Whole grain crackers. Low-sodium crackers. Vegetables Fresh or frozen vegetables  (raw, steamed, roasted, or grilled). Low-sodium or reduced-sodium tomato and vegetable juices. Low-sodium or reduced-sodium tomato sauce and paste. Low-sodium or reduced-sodium canned vegetables.  Fruits All fresh, canned (in natural juice), or frozen fruits. Meat and Other Protein Products Ground beef (85% or leaner), grass-fed beef, or beef trimmed of fat. Skinless chicken or turkey. Ground chicken or turkey. Pork trimmed of fat. All fish and seafood. Eggs. Dried beans, peas, or lentils. Unsalted nuts and seeds. Unsalted canned beans. Dairy Low-fat dairy products, such as skim or 1% milk, 2% or reduced-fat cheeses, low-fat ricotta or cottage cheese, or plain low-fat yogurt. Low-sodium or reduced-sodium cheeses. Fats and Oils Tub margarines without trans fats. Light or reduced-fat mayonnaise and salad dressings (reduced sodium). Avocado. Safflower, olive, or canola oils. Natural peanut or almond butter. Other Unsalted popcorn and pretzels. The items listed above may not be a complete list of recommended foods or beverages. Contact your dietitian for more options. WHAT FOODS ARE NOT RECOMMENDED? Grains White bread. White pasta. White rice. Refined cornbread. Bagels and croissants. Crackers that contain trans fat. Vegetables Creamed or fried vegetables. Vegetables in a cheese sauce. Regular canned vegetables. Regular canned tomato sauce and paste. Regular tomato and vegetable juices. Fruits Dried fruits. Canned fruit in light or heavy syrup. Fruit juice. Meat and Other Protein Products Fatty cuts of meat. Ribs, chicken wings, bacon, sausage, bologna, salami, chitterlings, fatback, hot dogs, bratwurst, and packaged luncheon meats. Salted nuts and seeds. Canned beans with salt. Dairy Whole or 2% milk, cream, half-and-half, and cream cheese. Whole-fat or sweetened yogurt. Full-fat   cheeses or blue cheese. Nondairy creamers and whipped toppings. Processed cheese, cheese spreads, or cheese  curds. Condiments Onion and garlic salt, seasoned salt, table salt, and sea salt. Canned and packaged gravies. Worcestershire sauce. Tartar sauce. Barbecue sauce. Teriyaki sauce. Soy sauce, including reduced sodium. Steak sauce. Fish sauce. Oyster sauce. Cocktail sauce. Horseradish. Ketchup and mustard. Meat flavorings and tenderizers. Bouillon cubes. Hot sauce. Tabasco sauce. Marinades. Taco seasonings. Relishes. Fats and Oils Butter, stick margarine, lard, shortening, ghee, and bacon fat. Coconut, palm kernel, or palm oils. Regular salad dressings. Other Pickles and olives. Salted popcorn and pretzels. The items listed above may not be a complete list of foods and beverages to avoid. Contact your dietitian for more information. WHERE CAN I FIND MORE INFORMATION? National Heart, Lung, and Blood Institute: travelstabloid.com Document Released: 10/18/2011 Document Revised: 03/15/2014 Document Reviewed: 09/02/2013 [facility] Patient Information 2015 [place], [place]. This information is not intended to replace advice given to you by your health care provider. Make sure you discuss any questions you have with your health care provider. Diabetes and Exercise Exercising regularly is important. It is not just about losing weight. It has many health benefits, such as:  Improving your overall fitness, flexibility, and endurance.  Increasing your bone density.  Helping with weight control.  Decreasing your body fat.  Increasing your muscle strength.  Reducing stress and tension.  Improving your overall health. People with diabetes who exercise gain additional benefits because exercise:  Reduces appetite.  Improves the body's use of blood sugar (glucose).  Helps lower or control blood glucose.  Decreases blood pressure.  Helps control blood lipids (such as cholesterol and triglycerides).  Improves the body's use of the hormone insulin by:  Increasing the  body's insulin sensitivity.  Reducing the body's insulin needs.  Decreases the risk for heart disease because exercising:  Lowers cholesterol and triglycerides levels.  Increases the levels of good cholesterol (such as high-density lipoproteins [HDL]) in the body.  Lowers blood glucose levels. YOUR ACTIVITY PLAN  Choose an activity that you enjoy and set realistic goals. Your health care provider or diabetes educator can help you make an activity plan that works for you. Exercise regularly as directed by your health care provider. This includes:  Performing resistance training twice a week such as push-ups, sit-ups, lifting weights, or using resistance bands.  Performing 150 minutes of cardio exercises each week such as walking, running, or playing sports.  Staying active and spending no more than 90 minutes at one time being inactive. Even short bursts of exercise are good for you. Three 10-minute sessions spread throughout the day are just as beneficial as a single 30-minute session. Some exercise ideas include:  Taking the dog for a walk.  Taking the stairs instead of the elevator.  Dancing to your favorite song.  Doing an exercise video.  Doing your favorite exercise with a friend. RECOMMENDATIONS FOR EXERCISING WITH TYPE 1 OR TYPE 2 DIABETES   Check your blood glucose before exercising. If blood glucose levels are greater than 240 mg/dL, check for urine ketones. Do not exercise if ketones are present.  Avoid injecting insulin into areas of the body that are going to be exercised. For example, avoid injecting insulin into:  The arms when playing tennis.  The legs when jogging.  Keep a record of:  Food intake before and after you exercise.  Expected peak times of insulin action.  Blood glucose levels before and after you exercise.  The type and amount of exercise  you have done.  Review your records with your health care provider. Your health care provider will  help you to develop guidelines for adjusting food intake and insulin amounts before and after exercising.  If you take insulin or oral hypoglycemic agents, watch for signs and symptoms of hypoglycemia. They include:  Dizziness.  Shaking.  Sweating.  Chills.  Confusion.  Drink plenty of water while you exercise to prevent dehydration or heat stroke. Body water is lost during exercise and must be replaced.  Talk to your health care provider before starting an exercise program to make sure it is safe for you. Remember, almost any type of activity is better than none. Document Released: 01/19/2004 Document Revised: 03/15/2014 Document Reviewed: 04/07/2013 [facility] Patient Information 2015 [place], [place]. This information is not intended to replace advice given to you by your health care provider. Make sure you discuss any questions you have with your health care provider. Basic Carbohydrate Counting for Diabetes Mellitus Carbohydrate counting is a method for keeping track of the amount of carbohydrates you eat. Eating carbohydrates naturally increases the level of sugar (glucose) in your blood, so it is important for you to know the amount that is okay for you to have in every meal. Carbohydrate counting helps keep the level of glucose in your blood within normal limits. The amount of carbohydrates allowed is different for every person. A dietitian can help you calculate the amount that is right for you. Once you know the amount of carbohydrates you can have, you can count the carbohydrates in the foods you want to eat. Carbohydrates are found in the following foods:  Grains, such as breads and cereals.  Dried beans and soy products.  Starchy vegetables, such as potatoes, peas, and corn.  Fruit and fruit juices.  Milk and yogurt.  Sweets and snack foods, such as cake, cookies, candy, chips, soft drinks, and fruit drinks. CARBOHYDRATE COUNTING There are two ways to count the  carbohydrates in your food. You can use either of the methods or a combination of both. Reading the "Nutrition Facts" on [place] The "Nutrition Facts" is an area that is included on the labels of almost all packaged food and beverages in the [place]. It includes the serving size of that food or beverage and information about the nutrients in each serving of the food, including the grams (g) of carbohydrate per serving.  Decide the number of servings of this food or beverage that you will be able to eat or drink. Multiply that number of servings by the number of grams of carbohydrate that is listed on the label for that serving. The total will be the amount of carbohydrates you will be having when you eat or drink this food or beverage. Learning Standard Serving Sizes of Food When you eat food that is not packaged or does not include "Nutrition Facts" on the label, you need to measure the servings in order to count the amount of carbohydrates.A serving of most carbohydrate-rich foods contains about 15 g of carbohydrates. The following list includes serving sizes of carbohydrate-rich foods that provide 15 g ofcarbohydrate per serving:   1 slice of bread (1 oz) or 1 six-inch tortilla.    of a hamburger bun or English muffin.  4-6 crackers.   cup unsweetened dry cereal.    cup hot cereal.   cup rice or pasta.    cup mashed potatoes or  of a large baked potato.  1 cup fresh fruit or one  small piece of fruit.    cup canned or frozen fruit or fruit juice.  1 cup milk.   cup plain fat-free yogurt or yogurt sweetened with artificial sweeteners.   cup cooked dried beans or starchy vegetable, such as peas, corn, or potatoes.  Decide the number of standard-size servings that you will eat. Multiply that number of servings by 15 (the grams of carbohydrates in that serving). For example, if you eat 2 cups of strawberries, you will have eaten 2 servings and 30 g of  carbohydrates (2 servings x 15 g = 30 g). For foods such as soups and casseroles, in which more than one food is mixed in, you will need to count the carbohydrates in each food that is included. EXAMPLE OF CARBOHYDRATE COUNTING Sample Dinner  3 oz chicken breast.   cup of brown rice.   cup of corn.  1 cup milk.   1 cup strawberries with sugar-free whipped topping.  Carbohydrate Calculation Step 1: Identify the foods that contain carbohydrates:   Rice.   Corn.   Milk.   Strawberries. Step 2:Calculate the number of servings eaten of each:   2 servings of rice.   1 serving of corn.   1 serving of milk.   1 serving of strawberries. Step 3: Multiply each of those number of servings by 15 g:   2 servings of rice x 15 g = 30 g.   1 serving of corn x 15 g = 15 g.   1 serving of milk x 15 g = 15 g.   1 serving of strawberries x 15 g = 15 g. Step 4: Add together all of the amounts to find the total grams of carbohydrates eaten: 30 g + 15 g + 15 g + 15 g = 75 g. Document Released: 10/29/2005 Document Revised: 03/15/2014 Document Reviewed: 09/25/2013 [facility] Patient Information 2015 [place], [place]. This information is not intended to replace advice given to you by your health care provider. Make sure you discuss any questions you have with your health care provider.

## 2014-12-07 DIAGNOSIS — S31109D Unspecified open wound of abdominal wall, unspecified quadrant without penetration into peritoneal cavity, subsequent encounter: Secondary | ICD-10-CM | POA: Diagnosis not present

## 2014-12-07 DIAGNOSIS — T8189XD Other complications of procedures, not elsewhere classified, subsequent encounter: Secondary | ICD-10-CM | POA: Diagnosis not present

## 2014-12-14 ENCOUNTER — Telehealth: Payer: Self-pay | Admitting: Emergency Medicine

## 2014-12-14 ENCOUNTER — Encounter (HOSPITAL_BASED_OUTPATIENT_CLINIC_OR_DEPARTMENT_OTHER): Payer: Medicare Other | Attending: General Surgery

## 2014-12-14 DIAGNOSIS — E785 Hyperlipidemia, unspecified: Secondary | ICD-10-CM

## 2014-12-14 MED ORDER — GLIPIZIDE 5 MG PO TABS
5.0000 mg | ORAL_TABLET | Freq: Two times a day (BID) | ORAL | Status: DC
Start: 2014-12-14 — End: 2015-04-07

## 2014-12-14 MED ORDER — FENOFIBRATE 145 MG PO TABS: 145.0000 mg | ORAL_TABLET | Freq: Every day | ORAL | Status: DC

## 2014-12-14 MED ORDER — FERROUS SULFATE 325 (65 FE) MG PO TABS: 325.0000 mg | ORAL_TABLET | Freq: Two times a day (BID) | ORAL | Status: DC

## 2014-12-14 NOTE — Telephone Encounter (Signed)
-----   Message from Tresa Garter, MD sent at 12/08/2014  4:40 PM EST ----- Please inform patient that her triglyceride level is very high, her hemoglobin concentration is slightly low most likely from iron deficiency, her kidney function has been stable over the past year and her hemoglobin A1c is about 1% higher than previous results. We will start patient back on fenofibrate, prescribe iron supplement, add low dose oral medication to insulin for diabetic control, advise low-cholesterol, low-fat diet and regular physical exercise as tolerated.  Please call in the following prescriptions:  Fenofibrate 145 mg tablet by mouth daily, 90 tablets with 3 refills. Ferrous sulfate 325 mg tablet by mouth 2 times a day, 180 tablets with 3 refills Glipizide 5 mg tablet by mouth daily, 90 tablets with 3 refills.  Educate patient on Diabetic Diet Aim for 2-3 Carb Choices per meal (30-45 grams) +/- 1 either way  Aim for 0-15 Carbs per snack if hungry  Include protein in moderation with your meals and snacks  Consider reading food labels for Total Carbohydrate and Fat Grams of foods  Consider checking BG at alternate times per day  Continue taking medication as directed Fruit Punch - find one with no sugar  Measure and decrease portions of carbohydrate foods  Make your plate and don't go back for seconds

## 2014-12-14 NOTE — Telephone Encounter (Signed)
Pt given lab results with diabetes education and medication instructions Medications; Fenofibrate,Ferrous sulfate, and Glipizide e-scribed to pharmacy

## 2014-12-15 NOTE — Progress Notes (Signed)
Patient ID: Catherine Fowler, female   DOB: [birth year], 64 y.o.   MRN: [id number]   Catherine Fowler, is a 64 y.o. female  [id number]:[id number]  [id number]  DOB - 1951/02/22  Chief Complaint  Patient presents with  . Follow-up        Subjective:   Catherine Fowler is a 64 y.o. female here today for a follow up visit. Patient has history of hypertension, diabetes mellitus requiring insulin, colon cancer status post resection with complicated wound dehiscent, here today for routine follow-up. She is being scheduled for surgery for secondary wound closure, but the surgical team wants her hemoglobin A1c checked, and blood sugar controlled before surgery. Patient has not been seen since October 2015. No ED visit since last visit. Patient currently not taking any medication for hyperlipidemia. She has no complaint today. She is currently on 75 units of Lantus insulin at bedtime. She claims her blood pressure is controlled at home. She is also attempting to lose weight. Patient has No headache, No chest pain, No abdominal pain - No Nausea, No new weakness tingling or numbness, No Cough - SOB.  No problems updated.  ALLERGIES: Allergies  Allergen Reactions  . Pravastatin Sodium Itching and Rash    PAST MEDICAL HISTORY: Past Medical History  Diagnosis Date  . Diabetes mellitus   . Chronic kidney disease   . Colon cancer 09/18/13    MEDICATIONS AT HOME: Prior to Admission medications   Medication Sig Start Date End Date Taking? Authorizing Provider  glucose monitoring kit (FREESTYLE) monitoring kit 1 each by Does not apply route 4 (four) times daily - after meals and at bedtime. 1 month Diabetic Testing Supplies for QAC-QHS accuchecks. Any brand okay 08/14/13  Yes Thurnell Lose, MD  Insulin Glargine (LANTUS SOLOSTAR) 100 UNIT/ML Solostar Pen Inject 75 Units into the skin at bedtime. 11/03/14  Yes Tresa Garter, MD  fenofibrate (TRICOR) 145 MG tablet Take 1 tablet (145 mg total) by mouth daily.  12/14/14   Tresa Garter, MD  ferrous sulfate 325 (65 FE) MG tablet Take 1 tablet (325 mg total) by mouth 2 (two) times daily with a meal. 12/14/14   Tresa Garter, MD  glipiZIDE (GLUCOTROL) 5 MG tablet Take 1 tablet (5 mg total) by mouth 2 (two) times daily before a meal. 12/14/14   Tresa Garter, MD  lisinopril (PRINIVIL,ZESTRIL) 10 MG tablet Take 1 tablet (10 mg total) by mouth daily. Patient not taking: Reported on 12/06/2014 08/14/13   Thurnell Lose, MD  nystatin (MYCOSTATIN/NYSTOP) 100000 UNIT/GM POWD Apply enough to cover the affected area twice per day as needed. Patient not taking: Reported on 12/06/2014 11/25/13   Tresa Garter, MD     Objective:   Filed Vitals:   12/06/14 1230  BP: 166/89  Pulse: 85  Temp: 99.6 F (37.6 C)  TempSrc: Oral  Height: _0  (1.702 m)  Weight: 233 lb (105.688 kg)  SpO2: 97%    Exam General appearance : Awake, alert, not in any distress. Speech Clear. Not toxic looking, obese HEENT: Atraumatic and Normocephalic, pupils equally reactive to light and accomodation Neck: supple, no JVD. No cervical lymphadenopathy.  Chest:Good air entry bilaterally, no added sounds  CVS: S1 S2 regular, no murmurs.  Abdomen: Bowel sounds present, Non tender and not distended with no gaurding, rigidity or rebound. Extremities: B/L Lower Ext shows no edema, both legs are warm to touch Neurology: Awake alert, and oriented X 3, CN II-XII intact, Non focal  Skin:No Rash Wounds:N/A  Data Review Lab Results  Component Value Date   HGBA1C 9.70 12/06/2014   HGBA1C 8.7 09/09/2014   HGBA1C 6.6 05/04/2014     Assessment & Plan   1. Other specified diabetes mellitus without complications  - Glucose (CBG) - HgB A1c - Urinalysis Dipstick - insulin aspart (novoLOG) injection 10 Units; Inject 0.1 mLs (10 Units total) into the skin once.  - CBC with Differential/Platelet - COMPLETE METABOLIC PANEL WITH GFR - Lipid panel  - Continue current  insulin regimen - We'll consider adding oral hypoglycemic  Patient was counseled extensively about nutrition and exercise   Aim for 2-3 Carb Choices per meal (30-45 grams) +/- 1 either way  Aim for 0-15 Carbs per snack if hungry  Include protein in moderation with your meals and snacks  Consider reading food labels for Total Carbohydrate and Fat Grams of foods  Consider checking BG at alternate times per day  Continue taking medication as directed Fruit Punch - find one with no sugar  Measure and decrease portions of carbohydrate foods  Make your plate and don't go back for seconds   Return in about 2 weeks (around 12/20/2014) for BP Check, Nurse Visit, CBG, Lab/Nurse Visit.  The patient was given clear instructions to go to ER or return to medical center if symptoms don't improve, worsen or new problems develop. The patient verbalized understanding. The patient was told to call to get lab results if they haven't heard anything in the next week.   This note has been created with Surveyor, quantity. Any transcriptional errors are unintentional.    Angelica Chessman, MD, [place], [place], [place] and [facility] [place], [place]

## 2014-12-20 ENCOUNTER — Ambulatory Visit: Payer: Medicare Other | Attending: Internal Medicine | Admitting: *Deleted

## 2014-12-20 VITALS — BP 115/57 | HR 78 | Temp 98.3°F | Resp 18

## 2014-12-20 DIAGNOSIS — I10 Essential (primary) hypertension: Secondary | ICD-10-CM | POA: Insufficient documentation

## 2014-12-20 DIAGNOSIS — E1165 Type 2 diabetes mellitus with hyperglycemia: Secondary | ICD-10-CM

## 2014-12-20 DIAGNOSIS — E119 Type 2 diabetes mellitus without complications: Secondary | ICD-10-CM | POA: Insufficient documentation

## 2014-12-20 DIAGNOSIS — Z794 Long term (current) use of insulin: Secondary | ICD-10-CM | POA: Insufficient documentation

## 2014-12-20 LAB — POCT CBG (FASTING - GLUCOSE)-MANUAL ENTRY: Glucose Fasting, POC: 177 mg/dL — AB (ref 70–99)

## 2014-12-20 NOTE — Progress Notes (Signed)
Patient presents for BP check and CBG and record review, however, pt did not bring record or meter Med list reviewed; patient reports taking all meds as directed Patient reports AM fasting blood sugars ranging 146-154  CBG 177 fasting  BP 115/57 P 78 R 18 T  98.3 oral SpO2 98%  Patient advised to call for med refills at least 7 days before running out so as not to go without. Patient aware that she is to f/u with PCP 3 months from last visit. Due 03/07/2015

## 2015-01-04 DIAGNOSIS — T8189XA Other complications of procedures, not elsewhere classified, initial encounter: Secondary | ICD-10-CM | POA: Diagnosis not present

## 2015-01-04 DIAGNOSIS — C189 Malignant neoplasm of colon, unspecified: Secondary | ICD-10-CM | POA: Diagnosis not present

## 2015-01-04 DIAGNOSIS — S31109A Unspecified open wound of abdominal wall, unspecified quadrant without penetration into peritoneal cavity, initial encounter: Secondary | ICD-10-CM | POA: Diagnosis not present

## 2015-01-04 DIAGNOSIS — T814XXD Infection following a procedure, subsequent encounter: Secondary | ICD-10-CM | POA: Diagnosis not present

## 2015-01-06 ENCOUNTER — Telehealth: Payer: Self-pay | Admitting: Emergency Medicine

## 2015-01-06 NOTE — Telephone Encounter (Signed)
Patient dropped off a Living Will, It was given to me by Ander Purpura RN. There was not enough Pt-Identifiers thus I needed to call and confirm the document with the patient before adding to chart.

## 2015-01-11 ENCOUNTER — Encounter (HOSPITAL_BASED_OUTPATIENT_CLINIC_OR_DEPARTMENT_OTHER): Payer: Medicare Other | Attending: General Surgery

## 2015-01-11 DIAGNOSIS — Y839 Surgical procedure, unspecified as the cause of abnormal reaction of the patient, or of later complication, without mention of misadventure at the time of the procedure: Secondary | ICD-10-CM | POA: Diagnosis not present

## 2015-01-11 DIAGNOSIS — T8189XD Other complications of procedures, not elsewhere classified, subsequent encounter: Secondary | ICD-10-CM | POA: Insufficient documentation

## 2015-01-11 DIAGNOSIS — S31109D Unspecified open wound of abdominal wall, unspecified quadrant without penetration into peritoneal cavity, subsequent encounter: Secondary | ICD-10-CM | POA: Diagnosis not present

## 2015-01-11 DIAGNOSIS — R601 Generalized edema: Secondary | ICD-10-CM | POA: Diagnosis not present

## 2015-01-11 DIAGNOSIS — Z85038 Personal history of other malignant neoplasm of large intestine: Secondary | ICD-10-CM | POA: Insufficient documentation

## 2015-01-21 DIAGNOSIS — S31109D Unspecified open wound of abdominal wall, unspecified quadrant without penetration into peritoneal cavity, subsequent encounter: Secondary | ICD-10-CM | POA: Diagnosis not present

## 2015-01-21 DIAGNOSIS — Z85038 Personal history of other malignant neoplasm of large intestine: Secondary | ICD-10-CM | POA: Diagnosis not present

## 2015-01-21 DIAGNOSIS — T8131XS Disruption of external operation (surgical) wound, not elsewhere classified, sequela: Secondary | ICD-10-CM | POA: Diagnosis not present

## 2015-01-21 DIAGNOSIS — T8189XD Other complications of procedures, not elsewhere classified, subsequent encounter: Secondary | ICD-10-CM | POA: Diagnosis not present

## 2015-01-21 DIAGNOSIS — R601 Generalized edema: Secondary | ICD-10-CM | POA: Diagnosis not present

## 2015-01-25 DIAGNOSIS — S31109D Unspecified open wound of abdominal wall, unspecified quadrant without penetration into peritoneal cavity, subsequent encounter: Secondary | ICD-10-CM | POA: Diagnosis not present

## 2015-01-25 DIAGNOSIS — R601 Generalized edema: Secondary | ICD-10-CM | POA: Diagnosis not present

## 2015-01-25 DIAGNOSIS — T8189XD Other complications of procedures, not elsewhere classified, subsequent encounter: Secondary | ICD-10-CM | POA: Diagnosis not present

## 2015-01-25 DIAGNOSIS — Z85038 Personal history of other malignant neoplasm of large intestine: Secondary | ICD-10-CM | POA: Diagnosis not present

## 2015-01-27 NOTE — Progress Notes (Signed)
Patient ID: Catherine Fowler, female   DOB: [birth year], 64 y.o.   MRN: [id number]   Catherine Fowler, is a 64 y.o. female  [id number]  [id number]  DOB - [birth year]  Chief Complaint  Patient presents with  . Follow-up        Subjective:   Catherine Fowler is a 64 y.o. female here today for a follow up visit. Patient has history of hypertension, dyslipidemia and diabetes mellitus, she has no new complaint today, she claims compliant with medications. She is on insulin Lantus, lisinopril, glipizide and fenofibrate. She does not need to refill her medications today. Patient has No headache, No chest pain, No abdominal pain - No Nausea, No new weakness tingling or numbness, No Cough - SOB.  No problems updated.  ALLERGIES: Allergies  Allergen Reactions  . Pravastatin Sodium Itching and Rash  . Simvastatin Rash    PAST MEDICAL HISTORY: Past Medical History  Diagnosis Date  . Diabetes mellitus   . Chronic kidney disease   . Colon cancer 09/18/13    MEDICATIONS AT HOME: Prior to Admission medications   Medication Sig Start Date End Date Taking? Authorizing Provider  glucose monitoring kit (FREESTYLE) monitoring kit 1 each by Does not apply route 4 (four) times daily - after meals and at bedtime. 1 month Diabetic Testing Supplies for QAC-QHS accuchecks. Any brand okay 08/14/13  Yes Thurnell Lose, MD  fenofibrate (TRICOR) 145 MG tablet Take 1 tablet (145 mg total) by mouth daily. 12/14/14   Tresa Garter, MD  ferrous sulfate 325 (65 FE) MG tablet Take 1 tablet (325 mg total) by mouth 2 (two) times daily with a meal. Patient not taking: Reported on 12/20/2014 12/14/14   Tresa Garter, MD  glipiZIDE (GLUCOTROL) 5 MG tablet Take 1 tablet (5 mg total) by mouth 2 (two) times daily before a meal. Patient not taking: Reported on 12/20/2014 12/14/14   Tresa Garter, MD  Insulin Glargine (LANTUS SOLOSTAR) 100 UNIT/ML Solostar Pen Inject 75 Units into the skin at bedtime. 11/03/14    Tresa Garter, MD  lisinopril (PRINIVIL,ZESTRIL) 10 MG tablet Take 1 tablet (10 mg total) by mouth daily. Patient not taking: Reported on 12/06/2014 08/14/13   Thurnell Lose, MD  nystatin (MYCOSTATIN/NYSTOP) 100000 UNIT/GM POWD Apply enough to cover the affected area twice per day as needed. Patient not taking: Reported on 12/06/2014 11/25/13   Tresa Garter, MD     Objective:   Filed Vitals:   09/09/14 0909  BP: 154/78  Pulse: 74  Temp: 98.7 F (37.1 C)  TempSrc: Oral  Resp: 16  Height: _0  (1.702 m)  Weight: 232 lb (105.235 kg)  SpO2: 99%    Exam General appearance : Awake, alert, not in any distress. Speech Clear. Not toxic looking HEENT: Atraumatic and Normocephalic, pupils equally reactive to light and accomodation Neck: supple, no JVD. No cervical lymphadenopathy.  Chest:Good air entry bilaterally, no added sounds  CVS: S1 S2 regular, no murmurs.  Abdomen: Bowel sounds present, Non tender and not distended with no gaurding, rigidity or rebound. Extremities: B/L Lower Ext shows no edema, both legs are warm to touch Neurology: Awake alert, and oriented X 3, CN II-XII intact, Non focal Skin:No Rash  Assessment & Plan   1. Type 2 diabetes mellitus without complication  - Glucose (CBG) - HgB A1c   Aim for 2-3 Carb Choices per meal (30-45 grams) +/- 1 either way  Aim for 0-15 Carbs per snack if hungry  Include protein in moderation with your meals and snacks  Consider reading food labels for Total Carbohydrate and Fat Grams of foods  Consider checking BG at alternate times per day  Continue taking medication as directed Fruit Punch - find one with no sugar  Measure and decrease portions of carbohydrate foods  Make your plate and don't go back for seconds   2. Essential hypertension  - We have discussed target BP range and blood pressure goal - I have advised patient to check BP regularly and to call us back or report to clinic if the numbers are  consistently higher than 140/90  - We discussed the importance of compliance with medical therapy and DASH diet recommended, consequences of uncontrolled hypertension discussed.  - continue current BP medications   Patient have been counseled extensively about nutrition and exercise  Return in about 3 months (around 12/10/2014), or if symptoms worsen or fail to improve, for Hemoglobin A1C and Follow up, DM, Annual Physical.  The patient was given clear instructions to go to ER or return to medical center if symptoms don't improve, worsen or new problems develop. The patient verbalized understanding. The patient was told to call to get lab results if they haven't heard anything in the next week.   This note has been created with Surveyor, quantity. Any transcriptional errors are unintentional.    Angelica Chessman, MD, [place], [place], [place], [place] and [facility] [place], [place]

## 2015-02-01 DIAGNOSIS — R601 Generalized edema: Secondary | ICD-10-CM | POA: Diagnosis not present

## 2015-02-01 DIAGNOSIS — Z85038 Personal history of other malignant neoplasm of large intestine: Secondary | ICD-10-CM | POA: Diagnosis not present

## 2015-02-01 DIAGNOSIS — S31109D Unspecified open wound of abdominal wall, unspecified quadrant without penetration into peritoneal cavity, subsequent encounter: Secondary | ICD-10-CM | POA: Diagnosis not present

## 2015-02-01 DIAGNOSIS — T8189XD Other complications of procedures, not elsewhere classified, subsequent encounter: Secondary | ICD-10-CM | POA: Diagnosis not present

## 2015-02-02 DIAGNOSIS — Z794 Long term (current) use of insulin: Secondary | ICD-10-CM | POA: Diagnosis not present

## 2015-02-02 DIAGNOSIS — E118 Type 2 diabetes mellitus with unspecified complications: Secondary | ICD-10-CM | POA: Diagnosis not present

## 2015-02-02 DIAGNOSIS — E669 Obesity, unspecified: Secondary | ICD-10-CM | POA: Diagnosis not present

## 2015-02-02 DIAGNOSIS — Z1211 Encounter for screening for malignant neoplasm of colon: Secondary | ICD-10-CM | POA: Diagnosis not present

## 2015-02-02 DIAGNOSIS — D124 Benign neoplasm of descending colon: Secondary | ICD-10-CM | POA: Diagnosis not present

## 2015-02-02 DIAGNOSIS — D649 Anemia, unspecified: Secondary | ICD-10-CM | POA: Diagnosis not present

## 2015-02-02 DIAGNOSIS — T8189XA Other complications of procedures, not elsewhere classified, initial encounter: Secondary | ICD-10-CM | POA: Diagnosis not present

## 2015-02-02 DIAGNOSIS — Z85038 Personal history of other malignant neoplasm of large intestine: Secondary | ICD-10-CM | POA: Diagnosis not present

## 2015-02-02 DIAGNOSIS — T814XXA Infection following a procedure, initial encounter: Secondary | ICD-10-CM | POA: Diagnosis not present

## 2015-02-02 DIAGNOSIS — D123 Benign neoplasm of transverse colon: Secondary | ICD-10-CM | POA: Diagnosis not present

## 2015-02-02 DIAGNOSIS — E119 Type 2 diabetes mellitus without complications: Secondary | ICD-10-CM | POA: Diagnosis not present

## 2015-02-02 DIAGNOSIS — C189 Malignant neoplasm of colon, unspecified: Secondary | ICD-10-CM | POA: Diagnosis not present

## 2015-02-08 DIAGNOSIS — R601 Generalized edema: Secondary | ICD-10-CM | POA: Diagnosis not present

## 2015-02-08 DIAGNOSIS — Z85038 Personal history of other malignant neoplasm of large intestine: Secondary | ICD-10-CM | POA: Diagnosis not present

## 2015-02-08 DIAGNOSIS — S31109D Unspecified open wound of abdominal wall, unspecified quadrant without penetration into peritoneal cavity, subsequent encounter: Secondary | ICD-10-CM | POA: Diagnosis not present

## 2015-02-08 DIAGNOSIS — T8189XD Other complications of procedures, not elsewhere classified, subsequent encounter: Secondary | ICD-10-CM | POA: Diagnosis not present

## 2015-02-15 ENCOUNTER — Encounter (HOSPITAL_BASED_OUTPATIENT_CLINIC_OR_DEPARTMENT_OTHER): Payer: Medicare Other | Attending: General Surgery

## 2015-02-15 DIAGNOSIS — S31109S Unspecified open wound of abdominal wall, unspecified quadrant without penetration into peritoneal cavity, sequela: Secondary | ICD-10-CM | POA: Diagnosis not present

## 2015-02-15 DIAGNOSIS — T8131XS Disruption of external operation (surgical) wound, not elsewhere classified, sequela: Secondary | ICD-10-CM | POA: Insufficient documentation

## 2015-02-15 DIAGNOSIS — Y838 Other surgical procedures as the cause of abnormal reaction of the patient, or of later complication, without mention of misadventure at the time of the procedure: Secondary | ICD-10-CM | POA: Insufficient documentation

## 2015-02-18 DIAGNOSIS — S31109S Unspecified open wound of abdominal wall, unspecified quadrant without penetration into peritoneal cavity, sequela: Secondary | ICD-10-CM | POA: Diagnosis not present

## 2015-02-18 DIAGNOSIS — T8131XS Disruption of external operation (surgical) wound, not elsewhere classified, sequela: Secondary | ICD-10-CM | POA: Diagnosis not present

## 2015-02-22 DIAGNOSIS — S31109S Unspecified open wound of abdominal wall, unspecified quadrant without penetration into peritoneal cavity, sequela: Secondary | ICD-10-CM | POA: Diagnosis not present

## 2015-02-22 DIAGNOSIS — T8131XS Disruption of external operation (surgical) wound, not elsewhere classified, sequela: Secondary | ICD-10-CM | POA: Diagnosis not present

## 2015-02-22 DIAGNOSIS — T8189XS Other complications of procedures, not elsewhere classified, sequela: Secondary | ICD-10-CM | POA: Diagnosis not present

## 2015-03-01 DIAGNOSIS — T8131XS Disruption of external operation (surgical) wound, not elsewhere classified, sequela: Secondary | ICD-10-CM | POA: Diagnosis not present

## 2015-03-01 DIAGNOSIS — S31109S Unspecified open wound of abdominal wall, unspecified quadrant without penetration into peritoneal cavity, sequela: Secondary | ICD-10-CM | POA: Diagnosis not present

## 2015-03-03 DIAGNOSIS — T8579XA Infection and inflammatory reaction due to other internal prosthetic devices, implants and grafts, initial encounter: Secondary | ICD-10-CM | POA: Diagnosis not present

## 2015-03-03 DIAGNOSIS — E785 Hyperlipidemia, unspecified: Secondary | ICD-10-CM | POA: Diagnosis not present

## 2015-03-03 DIAGNOSIS — E669 Obesity, unspecified: Secondary | ICD-10-CM | POA: Diagnosis not present

## 2015-03-03 DIAGNOSIS — T8189XS Other complications of procedures, not elsewhere classified, sequela: Secondary | ICD-10-CM | POA: Diagnosis not present

## 2015-03-03 DIAGNOSIS — E109 Type 1 diabetes mellitus without complications: Secondary | ICD-10-CM | POA: Diagnosis not present

## 2015-03-03 DIAGNOSIS — E781 Pure hyperglyceridemia: Secondary | ICD-10-CM | POA: Diagnosis not present

## 2015-03-03 DIAGNOSIS — Z85038 Personal history of other malignant neoplasm of large intestine: Secondary | ICD-10-CM | POA: Diagnosis not present

## 2015-03-03 DIAGNOSIS — Z794 Long term (current) use of insulin: Secondary | ICD-10-CM | POA: Diagnosis not present

## 2015-03-08 DIAGNOSIS — S31109S Unspecified open wound of abdominal wall, unspecified quadrant without penetration into peritoneal cavity, sequela: Secondary | ICD-10-CM | POA: Diagnosis not present

## 2015-03-08 DIAGNOSIS — T8131XS Disruption of external operation (surgical) wound, not elsewhere classified, sequela: Secondary | ICD-10-CM | POA: Diagnosis not present

## 2015-03-15 ENCOUNTER — Encounter (HOSPITAL_BASED_OUTPATIENT_CLINIC_OR_DEPARTMENT_OTHER): Payer: Medicare Other | Attending: General Surgery

## 2015-03-15 DIAGNOSIS — Z85038 Personal history of other malignant neoplasm of large intestine: Secondary | ICD-10-CM | POA: Insufficient documentation

## 2015-03-15 DIAGNOSIS — Z951 Presence of aortocoronary bypass graft: Secondary | ICD-10-CM | POA: Insufficient documentation

## 2015-03-15 DIAGNOSIS — T8131XD Disruption of external operation (surgical) wound, not elsewhere classified, subsequent encounter: Secondary | ICD-10-CM | POA: Diagnosis not present

## 2015-03-15 DIAGNOSIS — S31109D Unspecified open wound of abdominal wall, unspecified quadrant without penetration into peritoneal cavity, subsequent encounter: Secondary | ICD-10-CM | POA: Diagnosis not present

## 2015-03-22 DIAGNOSIS — Z951 Presence of aortocoronary bypass graft: Secondary | ICD-10-CM | POA: Diagnosis not present

## 2015-03-22 DIAGNOSIS — S31109D Unspecified open wound of abdominal wall, unspecified quadrant without penetration into peritoneal cavity, subsequent encounter: Secondary | ICD-10-CM | POA: Diagnosis not present

## 2015-03-22 DIAGNOSIS — Z85038 Personal history of other malignant neoplasm of large intestine: Secondary | ICD-10-CM | POA: Diagnosis not present

## 2015-03-22 DIAGNOSIS — T8131XD Disruption of external operation (surgical) wound, not elsewhere classified, subsequent encounter: Secondary | ICD-10-CM | POA: Diagnosis not present

## 2015-03-29 DIAGNOSIS — S31109D Unspecified open wound of abdominal wall, unspecified quadrant without penetration into peritoneal cavity, subsequent encounter: Secondary | ICD-10-CM | POA: Diagnosis not present

## 2015-03-29 DIAGNOSIS — T8131XD Disruption of external operation (surgical) wound, not elsewhere classified, subsequent encounter: Secondary | ICD-10-CM | POA: Diagnosis not present

## 2015-03-29 DIAGNOSIS — Z951 Presence of aortocoronary bypass graft: Secondary | ICD-10-CM | POA: Diagnosis not present

## 2015-03-29 DIAGNOSIS — Z85038 Personal history of other malignant neoplasm of large intestine: Secondary | ICD-10-CM | POA: Diagnosis not present

## 2015-04-04 ENCOUNTER — Ambulatory Visit: Payer: Medicare Other | Admitting: Internal Medicine

## 2015-04-05 DIAGNOSIS — S31109D Unspecified open wound of abdominal wall, unspecified quadrant without penetration into peritoneal cavity, subsequent encounter: Secondary | ICD-10-CM | POA: Diagnosis not present

## 2015-04-05 DIAGNOSIS — Z85038 Personal history of other malignant neoplasm of large intestine: Secondary | ICD-10-CM | POA: Diagnosis not present

## 2015-04-05 DIAGNOSIS — T8131XD Disruption of external operation (surgical) wound, not elsewhere classified, subsequent encounter: Secondary | ICD-10-CM | POA: Diagnosis not present

## 2015-04-05 DIAGNOSIS — Z951 Presence of aortocoronary bypass graft: Secondary | ICD-10-CM | POA: Diagnosis not present

## 2015-04-07 ENCOUNTER — Encounter: Payer: Self-pay | Admitting: Internal Medicine

## 2015-04-07 ENCOUNTER — Ambulatory Visit: Payer: Medicare Other | Attending: Internal Medicine | Admitting: Internal Medicine

## 2015-04-07 VITALS — BP 136/69 | HR 77 | Temp 98.7°F | Resp 16 | Ht 67.0 in | Wt 238.0 lb

## 2015-04-07 DIAGNOSIS — IMO0002 Reserved for concepts with insufficient information to code with codable children: Secondary | ICD-10-CM

## 2015-04-07 DIAGNOSIS — I10 Essential (primary) hypertension: Secondary | ICD-10-CM | POA: Diagnosis not present

## 2015-04-07 DIAGNOSIS — N760 Acute vaginitis: Secondary | ICD-10-CM | POA: Diagnosis not present

## 2015-04-07 DIAGNOSIS — Z794 Long term (current) use of insulin: Secondary | ICD-10-CM | POA: Diagnosis not present

## 2015-04-07 DIAGNOSIS — E1165 Type 2 diabetes mellitus with hyperglycemia: Secondary | ICD-10-CM | POA: Insufficient documentation

## 2015-04-07 LAB — POCT URINALYSIS DIPSTICK
BILIRUBIN UA: NEGATIVE
Glucose, UA: 500
Ketones, UA: NEGATIVE
Nitrite, UA: NEGATIVE
PH UA: 5.5
Protein, UA: NEGATIVE
RBC UA: NEGATIVE
SPEC GRAV UA: 1.02
Urobilinogen, UA: 0.2

## 2015-04-07 LAB — POCT GLYCOSYLATED HEMOGLOBIN (HGB A1C): HEMOGLOBIN A1C: 10.9

## 2015-04-07 LAB — GLUCOSE, POCT (MANUAL RESULT ENTRY): POC GLUCOSE: 289 mg/dL — AB (ref 70–99)

## 2015-04-07 MED ORDER — NYSTATIN 100000 UNIT/GM EX CREA: 1.0000 "application " | TOPICAL_CREAM | Freq: Two times a day (BID) | CUTANEOUS | Status: DC

## 2015-04-07 MED ORDER — INSULIN GLARGINE 100 UNIT/ML SOLOSTAR PEN: 80.0000 [IU] | PEN_INJECTOR | Freq: Every day | SUBCUTANEOUS | Status: DC

## 2015-04-07 MED ORDER — GLIPIZIDE 10 MG PO TABS
10.0000 mg | ORAL_TABLET | Freq: Two times a day (BID) | ORAL | Status: DC
Start: 2015-04-07 — End: 2015-09-15

## 2015-04-07 NOTE — Patient Instructions (Signed)
Diabetes and Exercise Exercising regularly is important. It is not just about losing weight. It has many health benefits, such as:  Improving your overall fitness, flexibility, and endurance.  Increasing your bone density.  Helping with weight control.  Decreasing your body fat.  Increasing your muscle strength.  Reducing stress and tension.  Improving your overall health. People with diabetes who exercise gain additional benefits because exercise:  Reduces appetite.  Improves the body's use of blood sugar (glucose).  Helps lower or control blood glucose.  Decreases blood pressure.  Helps control blood lipids (such as cholesterol and triglycerides).  Improves the body's use of the hormone insulin by:  Increasing the body's insulin sensitivity.  Reducing the body's insulin needs.  Decreases the risk for heart disease because exercising:  Lowers cholesterol and triglycerides levels.  Increases the levels of good cholesterol (such as high-density lipoproteins [HDL]) in the body.  Lowers blood glucose levels. YOUR ACTIVITY PLAN  Choose an activity that you enjoy and set realistic goals. Your health care provider or diabetes educator can help you make an activity plan that works for you. Exercise regularly as directed by your health care provider. This includes:  Performing resistance training twice a week such as push-ups, sit-ups, lifting weights, or using resistance bands.  Performing 150 minutes of cardio exercises each week such as walking, running, or playing sports.  Staying active and spending no more than 90 minutes at one time being inactive. Even short bursts of exercise are good for you. Three 10-minute sessions spread throughout the day are just as beneficial as a single 30-minute session. Some exercise ideas include:  Taking the dog for a walk.  Taking the stairs instead of the elevator.  Dancing to your favorite song.  Doing an exercise  video.  Doing your favorite exercise with a friend. RECOMMENDATIONS FOR EXERCISING WITH TYPE 1 OR TYPE 2 DIABETES   Check your blood glucose before exercising. If blood glucose levels are greater than 240 mg/dL, check for urine ketones. Do not exercise if ketones are present.  Avoid injecting insulin into areas of the body that are going to be exercised. For example, avoid injecting insulin into:  The arms when playing tennis.  The legs when jogging.  Keep a record of:  Food intake before and after you exercise.  Expected peak times of insulin action.  Blood glucose levels before and after you exercise.  The type and amount of exercise you have done.  Review your records with your health care provider. Your health care provider will help you to develop guidelines for adjusting food intake and insulin amounts before and after exercising.  If you take insulin or oral hypoglycemic agents, watch for signs and symptoms of hypoglycemia. They include:  Dizziness.  Shaking.  Sweating.  Chills.  Confusion.  Drink plenty of water while you exercise to prevent dehydration or heat stroke. Body water is lost during exercise and must be replaced.  Talk to your health care provider before starting an exercise program to make sure it is safe for you. Remember, almost any type of activity is better than none. Document Released: 01/19/2004 Document Revised: 03/15/2014 Document Reviewed: 04/07/2013 ExitCare Patient Information 2015 [employer]. This information is not intended to replace advice given to you by your health care provider. Make sure you discuss any questions you have with your health care provider. Basic Carbohydrate Counting for Diabetes Mellitus Carbohydrate counting is a method for keeping track of the amount of carbohydrates you eat.   Eating carbohydrates naturally increases the level of sugar (glucose) in your blood, so it is important for you to know the amount that is  okay for you to have in every meal. Carbohydrate counting helps keep the level of glucose in your blood within normal limits. The amount of carbohydrates allowed is different for every person. A dietitian can help you calculate the amount that is right for you. Once you know the amount of carbohydrates you can have, you can count the carbohydrates in the foods you want to eat. Carbohydrates are found in the following foods:  Grains, such as breads and cereals.  Dried beans and soy products.  Starchy vegetables, such as potatoes, peas, and corn.  Fruit and fruit juices.  Milk and yogurt.  Sweets and snack foods, such as cake, cookies, candy, chips, soft drinks, and fruit drinks. CARBOHYDRATE COUNTING There are two ways to count the carbohydrates in your food. You can use either of the methods or a combination of both. Reading the "Nutrition Facts" on Packaged Food The "Nutrition Facts" is an area that is included on the labels of almost all packaged food and beverages in the [place]. It includes the serving size of that food or beverage and information about the nutrients in each serving of the food, including the grams (g) of carbohydrate per serving.  Decide the number of servings of this food or beverage that you will be able to eat or drink. Multiply that number of servings by the number of grams of carbohydrate that is listed on the label for that serving. The total will be the amount of carbohydrates you will be having when you eat or drink this food or beverage. Learning Standard Serving Sizes of Food When you eat food that is not packaged or does not include "Nutrition Facts" on the label, you need to measure the servings in order to count the amount of carbohydrates.A serving of most carbohydrate-rich foods contains about 15 g of carbohydrates. The following list includes serving sizes of carbohydrate-rich foods that provide 15 g ofcarbohydrate per serving:   1 slice of bread  (1 oz) or 1 six-inch tortilla.    of a hamburger bun or English muffin.  4-6 crackers.   cup unsweetened dry cereal.    cup hot cereal.   cup rice or pasta.    cup mashed potatoes or  of a large baked potato.  1 cup fresh fruit or one small piece of fruit.    cup canned or frozen fruit or fruit juice.  1 cup milk.   cup plain fat-free yogurt or yogurt sweetened with artificial sweeteners.   cup cooked dried beans or starchy vegetable, such as peas, corn, or potatoes.  Decide the number of standard-size servings that you will eat. Multiply that number of servings by 15 (the grams of carbohydrates in that serving). For example, if you eat 2 cups of strawberries, you will have eaten 2 servings and 30 g of carbohydrates (2 servings x 15 g = 30 g). For foods such as soups and casseroles, in which more than one food is mixed in, you will need to count the carbohydrates in each food that is included. EXAMPLE OF CARBOHYDRATE COUNTING Sample Dinner  3 oz chicken breast.   cup of brown rice.   cup of corn.  1 cup milk.   1 cup strawberries with sugar-free whipped topping.  Carbohydrate Calculation Step 1: Identify the foods that contain carbohydrates:   Rice.   Corn.     Milk.   Strawberries. Step 2:Calculate the number of servings eaten of each:   2 servings of rice.   1 serving of corn.   1 serving of milk.   1 serving of strawberries. Step 3: Multiply each of those number of servings by 15 g:   2 servings of rice x 15 g = 30 g.   1 serving of corn x 15 g = 15 g.   1 serving of milk x 15 g = 15 g.   1 serving of strawberries x 15 g = 15 g. Step 4: Add together all of the amounts to find the total grams of carbohydrates eaten: 30 g + 15 g + 15 g + 15 g = 75 g. Document Released: 10/29/2005 Document Revised: 03/15/2014 Document Reviewed: 09/25/2013 ExitCare Patient Information 2015 [employer]. This information is not intended to  replace advice given to you by your health care provider. Make sure you discuss any questions you have with your health care provider.  

## 2015-04-07 NOTE — Progress Notes (Signed)
F/U DM  Glucose running 140-190 Stated maintaining a good low carbohydrate diet

## 2015-04-07 NOTE — Progress Notes (Signed)
Patient ID: Catherine Fowler, female   DOB: Aug 04, 1951, 64 y.o.   MRN: [id number]   Catherine Fowler, is a 64 y.o. female  [id number]  [id number]  DOB - 1951/05/07  Chief Complaint  Patient presents with  . Follow-up  . Diabetes        Subjective:   Catherine Fowler is a 64 y.o. female here today for a follow up visit. Patient is here today for follow-up of diabetes mellitus. She claims her blood sugar runs between 140-190 at home, she is compliant with medications, she reports no side effects, no hypoglycemic episode. She is also maintaining a very good look of high-grade diet and trying some physical exercise regimen. Her medical history is significant for hypertension, diabetes mellitus requiring insulin, colon cancer status post resection with complicated wound dehiscent. She is awaiting secondary closure of anterior abdominal wall surgical wound, surgery will only be done after good blood sugar control with hemoglobin A1c below 8 per patient. Patient has No headache, No chest pain, No new weakness tingling or numbness, No Cough - SOB.  Problem  Vaginitis and Vulvovaginitis    ALLERGIES: Allergies  Allergen Reactions  . Pravastatin Sodium Itching and Rash  . Simvastatin Rash    PAST MEDICAL HISTORY: Past Medical History  Diagnosis Date  . Diabetes mellitus   . Chronic kidney disease   . Colon cancer 09/18/13    MEDICATIONS AT HOME: Prior to Admission medications   Medication Sig Start Date End Date Taking? Authorizing Provider  glipiZIDE (GLUCOTROL) 10 MG tablet Take 1 tablet (10 mg total) by mouth 2 (two) times daily before a meal. 04/07/15  Yes Tresa Garter, MD  Insulin Glargine (LANTUS SOLOSTAR) 100 UNIT/ML Solostar Pen Inject 80 Units into the skin at bedtime. 04/07/15  Yes Tresa Garter, MD  lisinopril (PRINIVIL,ZESTRIL) 10 MG tablet Take 1 tablet (10 mg total) by mouth daily. 08/14/13  Yes Thurnell Lose, MD  fenofibrate (TRICOR) 145 MG tablet Take 1  tablet (145 mg total) by mouth daily. Patient not taking: Reported on 04/07/2015 12/14/14   Tresa Garter, MD  ferrous sulfate 325 (65 FE) MG tablet Take 1 tablet (325 mg total) by mouth 2 (two) times daily with a meal. Patient not taking: Reported on 12/20/2014 12/14/14   Tresa Garter, MD  glucose monitoring kit (FREESTYLE) monitoring kit 1 each by Does not apply route 4 (four) times daily - after meals and at bedtime. 1 month Diabetic Testing Supplies for QAC-QHS accuchecks. Any brand okay Patient not taking: Reported on 04/07/2015 08/14/13   Thurnell Lose, MD  nystatin cream (MYCOSTATIN) Apply 1 application topically 2 (two) times daily. 04/07/15   Tresa Garter, MD     Objective:   Filed Vitals:   04/07/15 1235  BP: 136/69  Pulse: 77  Temp: 98.7 F (37.1 C)  TempSrc: Oral  Resp: 16  Height: $Remove'5\' 7"'ELTVaUd$  (1.702 m)  Weight: 238 lb (107.956 kg)  SpO2: 95%    Exam General appearance : Awake, alert, not in any distress. Speech Clear. Not toxic looking HEENT: Atraumatic and Normocephalic, pupils equally reactive to light and accomodation Neck: supple, no JVD. No cervical lymphadenopathy.  Chest:Good air entry bilaterally, no added sounds  CVS: S1 S2 regular, no murmurs.  Abdomen: Bowel sounds present, Non tender and not distended with no gaurding, rigidity or rebound. Extremities: B/L Lower Ext shows no edema, both legs are warm to touch Neurology: Awake alert, and oriented X 3, CN II-XII intact,  Non focal Skin:No Rash  Data Review Lab Results  Component Value Date   HGBA1C 10.90 04/07/2015   HGBA1C 9.70 12/06/2014   HGBA1C 8.7 09/09/2014     Assessment & Plan   1. DM (diabetes mellitus), type 2, uncontrolled  - POCT glycosylated hemoglobin (Hb A1C) - Glucose (CBG) - POCT urinalysis dipstick - Insulin Glargine (LANTUS SOLOSTAR) 100 UNIT/ML Solostar Pen; Inject 80 Units into the skin at bedtime.  Dispense: 5 pen; Refill: 3 - glipiZIDE (GLUCOTROL) 10 MG tablet;  Take 1 tablet (10 mg total) by mouth 2 (two) times daily before a meal.  Dispense: 180 tablet; Refill: 3   Aim for 2-3 Carb Choices per meal (30-45 grams) +/- 1 either way  Aim for 0-15 Carbs per snack if hungry  Include protein in moderation with your meals and snacks  Consider reading food labels for Total Carbohydrate and Fat Grams of foods  Consider checking BG at alternate times per day  Continue taking medication as directed Fruit Punch - find one with no sugar  Measure and decrease portions of carbohydrate foods  Make your plate and don't go back for seconds   2. Vaginitis and vulvovaginitis  - nystatin cream (MYCOSTATIN); Apply 1 application topically 2 (two) times daily.  Dispense: 30 g; Refill: 3  3. Essential hypertension  - We have discussed target BP range and blood pressure goal - I have advised patient to check BP regularly and to call us back or report to clinic if the numbers are consistently higher than 140/90  - We discussed the importance of compliance with medical therapy and DASH diet recommended, consequences of uncontrolled hypertension discussed.  - continue current BP medications  Patient have been counseled extensively about nutrition and exercise Return in about 2 weeks (around 04/21/2015), or if symptoms worsen or fail to improve, for CBG, Lab/Nurse Visit.  The patient was given clear instructions to go to ER or return to medical center if symptoms don't improve, worsen or new problems develop. The patient verbalized understanding. The patient was told to call to get lab results if they haven't heard anything in the next week.   This note has been created with Surveyor, quantity. Any transcriptional errors are unintentional.    Angelica Chessman, MD, [place], [place], [place], [place] and [place], [place]   04/07/2015, 1:01 PM

## 2015-04-12 DIAGNOSIS — Z85038 Personal history of other malignant neoplasm of large intestine: Secondary | ICD-10-CM | POA: Diagnosis not present

## 2015-04-12 DIAGNOSIS — Z951 Presence of aortocoronary bypass graft: Secondary | ICD-10-CM | POA: Diagnosis not present

## 2015-04-12 DIAGNOSIS — S31109D Unspecified open wound of abdominal wall, unspecified quadrant without penetration into peritoneal cavity, subsequent encounter: Secondary | ICD-10-CM | POA: Diagnosis not present

## 2015-04-12 DIAGNOSIS — T8131XD Disruption of external operation (surgical) wound, not elsewhere classified, subsequent encounter: Secondary | ICD-10-CM | POA: Diagnosis not present

## 2015-04-19 ENCOUNTER — Encounter (HOSPITAL_BASED_OUTPATIENT_CLINIC_OR_DEPARTMENT_OTHER): Payer: Medicare Other | Attending: General Surgery

## 2015-04-19 DIAGNOSIS — Y832 Surgical operation with anastomosis, bypass or graft as the cause of abnormal reaction of the patient, or of later complication, without mention of misadventure at the time of the procedure: Secondary | ICD-10-CM | POA: Insufficient documentation

## 2015-04-19 DIAGNOSIS — E785 Hyperlipidemia, unspecified: Secondary | ICD-10-CM | POA: Diagnosis not present

## 2015-04-19 DIAGNOSIS — Z85038 Personal history of other malignant neoplasm of large intestine: Secondary | ICD-10-CM | POA: Insufficient documentation

## 2015-04-19 DIAGNOSIS — E1165 Type 2 diabetes mellitus with hyperglycemia: Secondary | ICD-10-CM | POA: Insufficient documentation

## 2015-04-19 DIAGNOSIS — Z9884 Bariatric surgery status: Secondary | ICD-10-CM | POA: Insufficient documentation

## 2015-04-19 DIAGNOSIS — T8131XS Disruption of external operation (surgical) wound, not elsewhere classified, sequela: Secondary | ICD-10-CM | POA: Insufficient documentation

## 2015-04-19 DIAGNOSIS — E781 Pure hyperglyceridemia: Secondary | ICD-10-CM | POA: Diagnosis not present

## 2015-04-21 ENCOUNTER — Ambulatory Visit: Payer: Medicare Other | Attending: Internal Medicine | Admitting: Internal Medicine

## 2015-04-21 VITALS — BP 156/76 | HR 80 | Temp 98.1°F | Ht 67.0 in | Wt 236.0 lb

## 2015-04-21 DIAGNOSIS — E1165 Type 2 diabetes mellitus with hyperglycemia: Secondary | ICD-10-CM | POA: Insufficient documentation

## 2015-04-21 DIAGNOSIS — I10 Essential (primary) hypertension: Secondary | ICD-10-CM | POA: Diagnosis not present

## 2015-04-21 LAB — GLUCOSE, POCT (MANUAL RESULT ENTRY): POC Glucose: 210 mg/dl — AB (ref 70–99)

## 2015-04-21 NOTE — Patient Instructions (Signed)
Diabetes and Exercise Exercising regularly is important. It is not just about losing weight. It has many health benefits, such as:  Improving your overall fitness, flexibility, and endurance.  Increasing your bone density.  Helping with weight control.  Decreasing your body fat.  Increasing your muscle strength.  Reducing stress and tension.  Improving your overall health. People with diabetes who exercise gain additional benefits because exercise:  Reduces appetite.  Improves the body's use of blood sugar (glucose).  Helps lower or control blood glucose.  Decreases blood pressure.  Helps control blood lipids (such as cholesterol and triglycerides).  Improves the body's use of the hormone insulin by:  Increasing the body's insulin sensitivity.  Reducing the body's insulin needs.  Decreases the risk for heart disease because exercising:  Lowers cholesterol and triglycerides levels.  Increases the levels of good cholesterol (such as high-density lipoproteins [HDL]) in the body.  Lowers blood glucose levels. YOUR ACTIVITY PLAN  Choose an activity that you enjoy and set realistic goals. Your health care provider or diabetes educator can help you make an activity plan that works for you. Exercise regularly as directed by your health care provider. This includes:  Performing resistance training twice a week such as push-ups, sit-ups, lifting weights, or using resistance bands.  Performing 150 minutes of cardio exercises each week such as walking, running, or playing sports.  Staying active and spending no more than 90 minutes at one time being inactive. Even short bursts of exercise are good for you. Three 10-minute sessions spread throughout the day are just as beneficial as a single 30-minute session. Some exercise ideas include:  Taking the dog for a walk.  Taking the stairs instead of the elevator.  Dancing to your favorite song.  Doing an exercise  video.  Doing your favorite exercise with a friend. RECOMMENDATIONS FOR EXERCISING WITH TYPE 1 OR TYPE 2 DIABETES   Check your blood glucose before exercising. If blood glucose levels are greater than 240 mg/dL, check for urine ketones. Do not exercise if ketones are present.  Avoid injecting insulin into areas of the body that are going to be exercised. For example, avoid injecting insulin into:  The arms when playing tennis.  The legs when jogging.  Keep a record of:  Food intake before and after you exercise.  Expected peak times of insulin action.  Blood glucose levels before and after you exercise.  The type and amount of exercise you have done.  Review your records with your health care provider. Your health care provider will help you to develop guidelines for adjusting food intake and insulin amounts before and after exercising.  If you take insulin or oral hypoglycemic agents, watch for signs and symptoms of hypoglycemia. They include:  Dizziness.  Shaking.  Sweating.  Chills.  Confusion.  Drink plenty of water while you exercise to prevent dehydration or heat stroke. Body water is lost during exercise and must be replaced.  Talk to your health care provider before starting an exercise program to make sure it is safe for you. Remember, almost any type of activity is better than none. Document Released: 01/19/2004 Document Revised: 03/15/2014 Document Reviewed: 04/07/2013 ExitCare Patient Information 2015 [employer]. This information is not intended to replace advice given to you by your health care provider. Make sure you discuss any questions you have with your health care provider.  

## 2015-04-21 NOTE — Progress Notes (Signed)
Patient ID: Catherine Fowler, female   DOB: [birth year], 64 y.o.   MRN: [id number]   Catherine Fowler, is a 64 y.o. female  [id number]  [id number]  DOB - February 12, 1951  Chief Complaint  Patient presents with  . Diabetes        Subjective:   Catherine Fowler is a 64 y.o. female here today for a follow up visit. Patient was seen here 2 weeks ago with increasing hemoglobin A1c, changes were made to her regimen and scheduled to come for blood sugar log for review today. Patient's blood sugar has ranged between 140 fasting and 250 postprandial at home based on the current regimen. Patient reports no hypoglycemic episode. She is also trying to lose weight and to also change her diet in attempts to improve her glycemic control. Patient has no complaint today. Patient has No headache, No chest pain, No abdominal pain - No Nausea, No new weakness tingling or numbness, No Cough - SOB.  Problem  Type 2 Diabetes Mellitus With Hyperglycemia    ALLERGIES: Allergies  Allergen Reactions  . Pravastatin Sodium Itching and Rash  . Simvastatin Rash    PAST MEDICAL HISTORY: Past Medical History  Diagnosis Date  . Diabetes mellitus   . Chronic kidney disease   . Colon cancer 09/18/13    MEDICATIONS AT HOME: Prior to Admission medications   Medication Sig Start Date End Date Taking? Authorizing Provider  fenofibrate (TRICOR) 145 MG tablet Take 1 tablet (145 mg total) by mouth daily. 12/14/14  Yes Tresa Garter, MD  glipiZIDE (GLUCOTROL) 10 MG tablet Take 1 tablet (10 mg total) by mouth 2 (two) times daily before a meal. 04/07/15  Yes Shley Dolby E Doreene Burke, MD  glucose monitoring kit (FREESTYLE) monitoring kit 1 each by Does not apply route 4 (four) times daily - after meals and at bedtime. 1 month Diabetic Testing Supplies for QAC-QHS accuchecks. Any brand okay 08/14/13  Yes Thurnell Lose, MD  Insulin Glargine (LANTUS SOLOSTAR) 100 UNIT/ML Solostar Pen Inject 80 Units into the skin at bedtime. 04/07/15   Yes Tresa Garter, MD  nystatin cream (MYCOSTATIN) Apply 1 application topically 2 (two) times daily. 04/07/15  Yes Tresa Garter, MD     Objective:   Filed Vitals:   04/21/15 1444  BP: 156/76  Pulse: 80  Temp: 98.1 F (36.7 C)  Height: 5' 7" (1.702 m)  Weight: 236 lb (107.049 kg)  SpO2: 95%    Exam General appearance : Awake, alert, not in any distress. Speech Clear. Not toxic looking, morbidly obese HEENT: Atraumatic and Normocephalic, pupils equally reactive to light and accomodation Neck: supple, no JVD. No cervical lymphadenopathy.  Chest:Good air entry bilaterally, no added sounds  CVS: S1 S2 regular, no murmurs.  Abdomen: Bowel sounds present, Non tender and not distended with no gaurding, rigidity or rebound. Extremities: B/L Lower Ext shows no edema, both legs are warm to touch Neurology: Awake alert, and oriented X 3, CN II-XII intact, Non focal Skin:No Rash  Data Review Lab Results  Component Value Date   HGBA1C 10.90 04/07/2015   HGBA1C 9.70 12/06/2014   HGBA1C 8.7 09/09/2014     Assessment & Plan   1. Type 2 diabetes mellitus with hyperglycemia  - Glucose (CBG) Aim for 2-3 Carb Choices per meal (30-45 grams) +/- 1 either way  Aim for 0-15 Carbs per snack if hungry  Include protein in moderation with your meals and snacks  Consider reading food labels for Total Carbohydrate and  Fat Grams of foods  Consider checking BG at alternate times per day  Continue taking medication as directed Fruit Punch - find one with no sugar  Measure and decrease portions of carbohydrate foods  Make your plate and don't go back for seconds   2. Essential hypertension - We have discussed target BP range and blood pressure goal - I have advised patient to check BP regularly and to call us back or report to clinic if the numbers are consistently higher than 140/90  - We discussed the importance of compliance with medical therapy and DASH diet recommended,  consequences of uncontrolled hypertension discussed.  - continue current BP medications  Patient have been counseled extensively about nutrition and exercise Return in about 4 weeks (around 05/19/2015) for CBG, Follow up HTN, Routine Follow Up.  The patient was given clear instructions to go to ER or return to medical center if symptoms don't improve, worsen or new problems develop. The patient verbalized understanding. The patient was told to call to get lab results if they haven't heard anything in the next week.   This note has been created with Surveyor, quantity. Any transcriptional errors are unintentional.    Angelica Chessman, MD, [place], [place], [place], [place] and [place], [place]   04/21/2015, 3:13 PM

## 2015-04-21 NOTE — Progress Notes (Signed)
Patient here for diabetes follow u p No other complaints

## 2015-04-22 ENCOUNTER — Encounter: Payer: Self-pay | Admitting: Internal Medicine

## 2015-04-26 DIAGNOSIS — E781 Pure hyperglyceridemia: Secondary | ICD-10-CM | POA: Diagnosis not present

## 2015-04-26 DIAGNOSIS — T8131XS Disruption of external operation (surgical) wound, not elsewhere classified, sequela: Secondary | ICD-10-CM | POA: Diagnosis not present

## 2015-04-26 DIAGNOSIS — Z9884 Bariatric surgery status: Secondary | ICD-10-CM | POA: Diagnosis not present

## 2015-04-26 DIAGNOSIS — Z85038 Personal history of other malignant neoplasm of large intestine: Secondary | ICD-10-CM | POA: Diagnosis not present

## 2015-04-26 DIAGNOSIS — E1165 Type 2 diabetes mellitus with hyperglycemia: Secondary | ICD-10-CM | POA: Diagnosis not present

## 2015-04-26 DIAGNOSIS — E785 Hyperlipidemia, unspecified: Secondary | ICD-10-CM | POA: Diagnosis not present

## 2015-05-03 DIAGNOSIS — Z85038 Personal history of other malignant neoplasm of large intestine: Secondary | ICD-10-CM | POA: Diagnosis not present

## 2015-05-03 DIAGNOSIS — E1165 Type 2 diabetes mellitus with hyperglycemia: Secondary | ICD-10-CM | POA: Diagnosis not present

## 2015-05-03 DIAGNOSIS — E781 Pure hyperglyceridemia: Secondary | ICD-10-CM | POA: Diagnosis not present

## 2015-05-03 DIAGNOSIS — Z9884 Bariatric surgery status: Secondary | ICD-10-CM | POA: Diagnosis not present

## 2015-05-03 DIAGNOSIS — T8131XS Disruption of external operation (surgical) wound, not elsewhere classified, sequela: Secondary | ICD-10-CM | POA: Diagnosis not present

## 2015-05-03 DIAGNOSIS — E785 Hyperlipidemia, unspecified: Secondary | ICD-10-CM | POA: Diagnosis not present

## 2015-05-04 DIAGNOSIS — T8189XS Other complications of procedures, not elsewhere classified, sequela: Secondary | ICD-10-CM | POA: Diagnosis not present

## 2015-05-10 DIAGNOSIS — T8131XS Disruption of external operation (surgical) wound, not elsewhere classified, sequela: Secondary | ICD-10-CM | POA: Diagnosis not present

## 2015-05-10 DIAGNOSIS — Z85038 Personal history of other malignant neoplasm of large intestine: Secondary | ICD-10-CM | POA: Diagnosis not present

## 2015-05-10 DIAGNOSIS — Z9884 Bariatric surgery status: Secondary | ICD-10-CM | POA: Diagnosis not present

## 2015-05-10 DIAGNOSIS — E1165 Type 2 diabetes mellitus with hyperglycemia: Secondary | ICD-10-CM | POA: Diagnosis not present

## 2015-05-10 DIAGNOSIS — E785 Hyperlipidemia, unspecified: Secondary | ICD-10-CM | POA: Diagnosis not present

## 2015-05-10 DIAGNOSIS — E781 Pure hyperglyceridemia: Secondary | ICD-10-CM | POA: Diagnosis not present

## 2015-05-17 ENCOUNTER — Encounter (HOSPITAL_BASED_OUTPATIENT_CLINIC_OR_DEPARTMENT_OTHER): Payer: Medicare Other | Attending: General Surgery

## 2015-05-17 DIAGNOSIS — N200 Calculus of kidney: Secondary | ICD-10-CM | POA: Diagnosis not present

## 2015-05-17 DIAGNOSIS — E785 Hyperlipidemia, unspecified: Secondary | ICD-10-CM | POA: Diagnosis not present

## 2015-05-17 DIAGNOSIS — L03311 Cellulitis of abdominal wall: Secondary | ICD-10-CM | POA: Diagnosis not present

## 2015-05-17 DIAGNOSIS — E669 Obesity, unspecified: Secondary | ICD-10-CM | POA: Diagnosis not present

## 2015-05-17 DIAGNOSIS — E781 Pure hyperglyceridemia: Secondary | ICD-10-CM | POA: Diagnosis not present

## 2015-05-17 DIAGNOSIS — T8189XD Other complications of procedures, not elsewhere classified, subsequent encounter: Secondary | ICD-10-CM | POA: Diagnosis not present

## 2015-05-17 DIAGNOSIS — K760 Fatty (change of) liver, not elsewhere classified: Secondary | ICD-10-CM | POA: Diagnosis not present

## 2015-05-17 DIAGNOSIS — Z9049 Acquired absence of other specified parts of digestive tract: Secondary | ICD-10-CM | POA: Diagnosis not present

## 2015-05-17 DIAGNOSIS — R1032 Left lower quadrant pain: Secondary | ICD-10-CM | POA: Diagnosis not present

## 2015-05-17 DIAGNOSIS — N1339 Other hydronephrosis: Secondary | ICD-10-CM | POA: Diagnosis not present

## 2015-05-17 DIAGNOSIS — Z888 Allergy status to other drugs, medicaments and biological substances status: Secondary | ICD-10-CM | POA: Diagnosis not present

## 2015-05-17 DIAGNOSIS — C189 Malignant neoplasm of colon, unspecified: Secondary | ICD-10-CM | POA: Diagnosis not present

## 2015-05-17 DIAGNOSIS — E119 Type 2 diabetes mellitus without complications: Secondary | ICD-10-CM | POA: Diagnosis not present

## 2015-05-17 DIAGNOSIS — Z794 Long term (current) use of insulin: Secondary | ICD-10-CM | POA: Diagnosis not present

## 2015-05-17 DIAGNOSIS — S31109D Unspecified open wound of abdominal wall, unspecified quadrant without penetration into peritoneal cavity, subsequent encounter: Secondary | ICD-10-CM | POA: Diagnosis not present

## 2015-05-18 DIAGNOSIS — T8189XD Other complications of procedures, not elsewhere classified, subsequent encounter: Secondary | ICD-10-CM | POA: Diagnosis not present

## 2015-05-18 DIAGNOSIS — L03311 Cellulitis of abdominal wall: Secondary | ICD-10-CM | POA: Diagnosis not present

## 2015-05-19 DIAGNOSIS — T8189XD Other complications of procedures, not elsewhere classified, subsequent encounter: Secondary | ICD-10-CM | POA: Diagnosis not present

## 2015-05-19 DIAGNOSIS — L03311 Cellulitis of abdominal wall: Secondary | ICD-10-CM | POA: Diagnosis not present

## 2015-05-21 DIAGNOSIS — N261 Atrophy of kidney (terminal): Secondary | ICD-10-CM | POA: Diagnosis not present

## 2015-05-21 DIAGNOSIS — T8579XD Infection and inflammatory reaction due to other internal prosthetic devices, implants and grafts, subsequent encounter: Secondary | ICD-10-CM | POA: Diagnosis not present

## 2015-05-21 DIAGNOSIS — L03311 Cellulitis of abdominal wall: Secondary | ICD-10-CM | POA: Diagnosis not present

## 2015-05-21 DIAGNOSIS — T8189XD Other complications of procedures, not elsewhere classified, subsequent encounter: Secondary | ICD-10-CM | POA: Diagnosis not present

## 2015-05-21 DIAGNOSIS — E669 Obesity, unspecified: Secondary | ICD-10-CM | POA: Diagnosis not present

## 2015-05-21 DIAGNOSIS — E1165 Type 2 diabetes mellitus with hyperglycemia: Secondary | ICD-10-CM | POA: Diagnosis not present

## 2015-05-23 ENCOUNTER — Encounter: Payer: Self-pay | Admitting: Internal Medicine

## 2015-05-23 ENCOUNTER — Ambulatory Visit: Payer: Medicare Other | Attending: Internal Medicine | Admitting: Internal Medicine

## 2015-05-23 VITALS — BP 150/76 | HR 69 | Temp 98.2°F | Resp 16

## 2015-05-23 DIAGNOSIS — N261 Atrophy of kidney (terminal): Secondary | ICD-10-CM | POA: Diagnosis not present

## 2015-05-23 DIAGNOSIS — L988 Other specified disorders of the skin and subcutaneous tissue: Secondary | ICD-10-CM

## 2015-05-23 DIAGNOSIS — I10 Essential (primary) hypertension: Secondary | ICD-10-CM | POA: Diagnosis not present

## 2015-05-23 DIAGNOSIS — T8579XD Infection and inflammatory reaction due to other internal prosthetic devices, implants and grafts, subsequent encounter: Secondary | ICD-10-CM | POA: Diagnosis not present

## 2015-05-23 DIAGNOSIS — E1165 Type 2 diabetes mellitus with hyperglycemia: Secondary | ICD-10-CM | POA: Diagnosis not present

## 2015-05-23 DIAGNOSIS — T8189XD Other complications of procedures, not elsewhere classified, subsequent encounter: Secondary | ICD-10-CM | POA: Diagnosis not present

## 2015-05-23 DIAGNOSIS — E785 Hyperlipidemia, unspecified: Secondary | ICD-10-CM | POA: Diagnosis not present

## 2015-05-23 DIAGNOSIS — E11628 Type 2 diabetes mellitus with other skin complications: Secondary | ICD-10-CM

## 2015-05-23 DIAGNOSIS — L03311 Cellulitis of abdominal wall: Secondary | ICD-10-CM | POA: Diagnosis not present

## 2015-05-23 DIAGNOSIS — E669 Obesity, unspecified: Secondary | ICD-10-CM | POA: Diagnosis not present

## 2015-05-23 LAB — GLUCOSE, POCT (MANUAL RESULT ENTRY): POC Glucose: 112 mg/dl — AB (ref 70–99)

## 2015-05-23 NOTE — Progress Notes (Signed)
  Pt is here to follow up on Diabetes.

## 2015-05-23 NOTE — Patient Instructions (Signed)
Diabetes and Exercise Exercising regularly is important. It is not just about losing weight. It has many health benefits, such as:  Improving your overall fitness, flexibility, and endurance.  Increasing your bone density.  Helping with weight control.  Decreasing your body fat.  Increasing your muscle strength.  Reducing stress and tension.  Improving your overall health. People with diabetes who exercise gain additional benefits because exercise:  Reduces appetite.  Improves the body's use of blood sugar (glucose).  Helps lower or control blood glucose.  Decreases blood pressure.  Helps control blood lipids (such as cholesterol and triglycerides).  Improves the body's use of the hormone insulin by:  Increasing the body's insulin sensitivity.  Reducing the body's insulin needs.  Decreases the risk for heart disease because exercising:  Lowers cholesterol and triglycerides levels.  Increases the levels of good cholesterol (such as high-density lipoproteins [HDL]) in the body.  Lowers blood glucose levels. YOUR ACTIVITY PLAN  Choose an activity that you enjoy and set realistic goals. Your health care provider or diabetes educator can help you make an activity plan that works for you. Exercise regularly as directed by your health care provider. This includes:  Performing resistance training twice a week such as push-ups, sit-ups, lifting weights, or using resistance bands.  Performing 150 minutes of cardio exercises each week such as walking, running, or playing sports.  Staying active and spending no more than 90 minutes at one time being inactive. Even short bursts of exercise are good for you. Three 10-minute sessions spread throughout the day are just as beneficial as a single 30-minute session. Some exercise ideas include:  Taking the dog for a walk.  Taking the stairs instead of the elevator.  Dancing to your favorite song.  Doing an exercise  video.  Doing your favorite exercise with a friend. RECOMMENDATIONS FOR EXERCISING WITH TYPE 1 OR TYPE 2 DIABETES   Check your blood glucose before exercising. If blood glucose levels are greater than 240 mg/dL, check for urine ketones. Do not exercise if ketones are present.  Avoid injecting insulin into areas of the body that are going to be exercised. For example, avoid injecting insulin into:  The arms when playing tennis.  The legs when jogging.  Keep a record of:  Food intake before and after you exercise.  Expected peak times of insulin action.  Blood glucose levels before and after you exercise.  The type and amount of exercise you have done.  Review your records with your health care provider. Your health care provider will help you to develop guidelines for adjusting food intake and insulin amounts before and after exercising.  If you take insulin or oral hypoglycemic agents, watch for signs and symptoms of hypoglycemia. They include:  Dizziness.  Shaking.  Sweating.  Chills.  Confusion.  Drink plenty of water while you exercise to prevent dehydration or heat stroke. Body water is lost during exercise and must be replaced.  Talk to your health care provider before starting an exercise program to make sure it is safe for you. Remember, almost any type of activity is better than none. Document Released: 01/19/2004 Document Revised: 03/15/2014 Document Reviewed: 04/07/2013 ExitCare Patient Information 2015 [employer]. This information is not intended to replace advice given to you by your health care provider. Make sure you discuss any questions you have with your health care provider. Basic Carbohydrate Counting for Diabetes Mellitus Carbohydrate counting is a method for keeping track of the amount of carbohydrates you eat.   Eating carbohydrates naturally increases the level of sugar (glucose) in your blood, so it is important for you to know the amount that is  okay for you to have in every meal. Carbohydrate counting helps keep the level of glucose in your blood within normal limits. The amount of carbohydrates allowed is different for every person. A dietitian can help you calculate the amount that is right for you. Once you know the amount of carbohydrates you can have, you can count the carbohydrates in the foods you want to eat. Carbohydrates are found in the following foods:  Grains, such as breads and cereals.  Dried beans and soy products.  Starchy vegetables, such as potatoes, peas, and corn.  Fruit and fruit juices.  Milk and yogurt.  Sweets and snack foods, such as cake, cookies, candy, chips, soft drinks, and fruit drinks. CARBOHYDRATE COUNTING There are two ways to count the carbohydrates in your food. You can use either of the methods or a combination of both. Reading the "Nutrition Facts" on Packaged Food The "Nutrition Facts" is an area that is included on the labels of almost all packaged food and beverages in the [place]. It includes the serving size of that food or beverage and information about the nutrients in each serving of the food, including the grams (g) of carbohydrate per serving.  Decide the number of servings of this food or beverage that you will be able to eat or drink. Multiply that number of servings by the number of grams of carbohydrate that is listed on the label for that serving. The total will be the amount of carbohydrates you will be having when you eat or drink this food or beverage. Learning Standard Serving Sizes of Food When you eat food that is not packaged or does not include "Nutrition Facts" on the label, you need to measure the servings in order to count the amount of carbohydrates.A serving of most carbohydrate-rich foods contains about 15 g of carbohydrates. The following list includes serving sizes of carbohydrate-rich foods that provide 15 g ofcarbohydrate per serving:   1 slice of bread  (1 oz) or 1 six-inch tortilla.    of a hamburger bun or English muffin.  4-6 crackers.   cup unsweetened dry cereal.    cup hot cereal.   cup rice or pasta.    cup mashed potatoes or  of a large baked potato.  1 cup fresh fruit or one small piece of fruit.    cup canned or frozen fruit or fruit juice.  1 cup milk.   cup plain fat-free yogurt or yogurt sweetened with artificial sweeteners.   cup cooked dried beans or starchy vegetable, such as peas, corn, or potatoes.  Decide the number of standard-size servings that you will eat. Multiply that number of servings by 15 (the grams of carbohydrates in that serving). For example, if you eat 2 cups of strawberries, you will have eaten 2 servings and 30 g of carbohydrates (2 servings x 15 g = 30 g). For foods such as soups and casseroles, in which more than one food is mixed in, you will need to count the carbohydrates in each food that is included. EXAMPLE OF CARBOHYDRATE COUNTING Sample Dinner  3 oz chicken breast.   cup of brown rice.   cup of corn.  1 cup milk.   1 cup strawberries with sugar-free whipped topping.  Carbohydrate Calculation Step 1: Identify the foods that contain carbohydrates:   Rice.   Corn.     Milk.   Strawberries. Step 2:Calculate the number of servings eaten of each:   2 servings of rice.   1 serving of corn.   1 serving of milk.   1 serving of strawberries. Step 3: Multiply each of those number of servings by 15 g:   2 servings of rice x 15 g = 30 g.   1 serving of corn x 15 g = 15 g.   1 serving of milk x 15 g = 15 g.   1 serving of strawberries x 15 g = 15 g. Step 4: Add together all of the amounts to find the total grams of carbohydrates eaten: 30 g + 15 g + 15 g + 15 g = 75 g. Document Released: 10/29/2005 Document Revised: 03/15/2014 Document Reviewed: 09/25/2013 ExitCare Patient Information 2015 [employer]. This information is not intended to  replace advice given to you by your health care provider. Make sure you discuss any questions you have with your health care provider.  

## 2015-05-23 NOTE — Progress Notes (Signed)
Patient ID: [name], female   DOB: [birth year], 64 y.o.   MRN: [id number]   Patriciann Becht, is a 64 y.o. female  TZG:[id number]  PRF:[id number]  DOB - 11/28/1950  Chief Complaint  Patient presents with  . Diabetes        Subjective:   Lalana Wachter is a 63 y.o. female here today for a follow up visit. Patient brought to the clinic her blood sugar log for review. Fasting blood sugar ranges between 78 and 140, postprandial blood sugar in the high 100's.  Patient is compliant with medications, she has no complaints today. She was recently admitted to the [facility] for cellulitis of her abdominal incision, was treated with antibiotics and daily wound dressing. Currently she has a home nurse visit, 3 times a week for wound dressing and she follows up regularly every 2 weeks. She is doing well at home.Patient has No headache, No chest pain, No abdominal pain - No Nausea, No new weakness tingling or numbness, No Cough - SOB.  Problem  Diabetes With Skin Complication    ALLERGIES: Allergies  Allergen Reactions  . Pravastatin Sodium Itching and Rash  . Simvastatin Rash    PAST MEDICAL HISTORY: Past Medical History  Diagnosis Date  . Diabetes mellitus   . Chronic kidney disease   . Colon cancer 09/18/13    MEDICATIONS AT HOME: Prior to Admission medications   Medication Sig Start Date End Date Taking? Authorizing Provider  fenofibrate (TRICOR) 145 MG tablet Take 1 tablet (145 mg total) by mouth daily. 12/14/14  Yes Tresa Garter, MD  glipiZIDE (GLUCOTROL) 10 MG tablet Take 1 tablet (10 mg total) by mouth 2 (two) times daily before a meal. 04/07/15  Yes [employer] E Doreene Burke, MD  glucose monitoring kit (FREESTYLE) monitoring kit 1 each by Does not apply route 4 (four) times daily - after meals and at bedtime. 1 month Diabetic Testing Supplies for QAC-QHS accuchecks. Any brand okay 08/14/13  Yes Thurnell Lose, MD  Insulin Glargine (LANTUS SOLOSTAR) 100  UNIT/ML Solostar Pen Inject 80 Units into the skin at bedtime. 04/07/15  Yes Tresa Garter, MD  sulfamethoxazole-trimethoprim (BACTRIM DS,SEPTRA DS) 800-160 MG per tablet Take 1 tablet by mouth. 05/19/15 05/27/15 Yes Historical Provider, MD  nystatin cream (MYCOSTATIN) Apply 1 application topically 2 (two) times daily. Patient not taking: Reported on 05/23/2015 04/07/15   Tresa Garter, MD     Objective:   Filed Vitals:   05/23/15 1132  BP: 150/76  Pulse: 69  Temp: 98.2 F (36.8 C)  TempSrc: Oral  Resp: 16  SpO2: 93%    Exam General appearance : Awake, alert, not in any distress. Speech Clear. Not toxic looking, Obese HEENT: Atraumatic and Normocephalic, pupils equally reactive to light and accomodation Neck: supple, no JVD. No cervical lymphadenopathy.  Chest:Good air entry bilaterally, no added sounds  CVS: S1 S2 regular, no murmurs.  Abdomen: Bowel sounds present, Non tender and not distended with no gaurding, rigidity or rebound. Extremities: B/L Lower Ext shows no edema, both legs are warm to touch Neurology: Awake alert, and oriented X 3, CN II-XII intact, Non focal Skin:No Rash  Data Review Lab Results  Component Value Date   HGBA1C 10.90 04/07/2015   HGBA1C 9.70 12/06/2014   HGBA1C 8.7 09/09/2014     Assessment & Plan   1. Diabetes with skin complication  - POCT hemoglobin - Glucose (CBG)  Aim for 30 minutes of exercise most days. Rethink what  you drink. Water is great! Aim for 2-3 Carb Choices per meal (30-45 grams) +/- 1 either way  Aim for 0-15 Carbs per snack if hungry  Include protein in moderation with your meals and snacks  Consider reading food labels for Total Carbohydrate and Fat Grams of foods  Consider checking BG at alternate times per day  Continue taking medication as directed Be mindful about how much sugar you are adding to beverages and other foods. Try to decrease. Consider splenda. Fruit Punch - find one with no sugar    Measure and decrease portions of carbohydrate foods  Make your plate and don't go back for seconds   2. Dyslipidemia To address this please limit saturated fat to no more than 7% of your calories, limit cholesterol to 200 mg/day, increase fiber and exercise as tolerated. If needed we may add another cholesterol lowering medication to your regimen.   3. Essential hypertension - We have discussed target BP range and blood pressure goal - I have advised patient to check BP regularly and to call us back or report to clinic if the numbers are consistently higher than 140/90  - We discussed the importance of compliance with medical therapy and DASH diet recommended, consequences of uncontrolled hypertension discussed.  - continue current BP medications  Patient have been counseled extensively about nutrition and exercise Return in about 6 weeks (around 07/04/2015) for Hemoglobin A1C and Follow up, DM, Follow up HTN, Follow up Pain and comorbidities.  The patient was given clear instructions to go to ER or return to medical center if symptoms don't improve, worsen or new problems develop. The patient verbalized understanding. The patient was told to call to get lab results if they haven't heard anything in the next week.   This note has been created with Surveyor, quantity. Any transcriptional errors are unintentional.    Angelica Chessman, MD, [place], [place], [place], [place] and [place], [place]   05/23/2015, 12:10 PM

## 2015-05-25 DIAGNOSIS — E1165 Type 2 diabetes mellitus with hyperglycemia: Secondary | ICD-10-CM | POA: Diagnosis not present

## 2015-05-25 DIAGNOSIS — T8189XD Other complications of procedures, not elsewhere classified, subsequent encounter: Secondary | ICD-10-CM | POA: Diagnosis not present

## 2015-05-25 DIAGNOSIS — E669 Obesity, unspecified: Secondary | ICD-10-CM | POA: Diagnosis not present

## 2015-05-25 DIAGNOSIS — T8579XD Infection and inflammatory reaction due to other internal prosthetic devices, implants and grafts, subsequent encounter: Secondary | ICD-10-CM | POA: Diagnosis not present

## 2015-05-25 DIAGNOSIS — L03311 Cellulitis of abdominal wall: Secondary | ICD-10-CM | POA: Diagnosis not present

## 2015-05-25 DIAGNOSIS — N261 Atrophy of kidney (terminal): Secondary | ICD-10-CM | POA: Diagnosis not present

## 2015-05-27 DIAGNOSIS — E669 Obesity, unspecified: Secondary | ICD-10-CM | POA: Diagnosis not present

## 2015-05-27 DIAGNOSIS — N261 Atrophy of kidney (terminal): Secondary | ICD-10-CM | POA: Diagnosis not present

## 2015-05-27 DIAGNOSIS — E1165 Type 2 diabetes mellitus with hyperglycemia: Secondary | ICD-10-CM | POA: Diagnosis not present

## 2015-05-27 DIAGNOSIS — T8579XD Infection and inflammatory reaction due to other internal prosthetic devices, implants and grafts, subsequent encounter: Secondary | ICD-10-CM | POA: Diagnosis not present

## 2015-05-27 DIAGNOSIS — L03311 Cellulitis of abdominal wall: Secondary | ICD-10-CM | POA: Diagnosis not present

## 2015-05-27 DIAGNOSIS — T8189XD Other complications of procedures, not elsewhere classified, subsequent encounter: Secondary | ICD-10-CM | POA: Diagnosis not present

## 2015-05-30 DIAGNOSIS — N261 Atrophy of kidney (terminal): Secondary | ICD-10-CM | POA: Diagnosis not present

## 2015-05-30 DIAGNOSIS — L03311 Cellulitis of abdominal wall: Secondary | ICD-10-CM | POA: Diagnosis not present

## 2015-05-30 DIAGNOSIS — E1165 Type 2 diabetes mellitus with hyperglycemia: Secondary | ICD-10-CM | POA: Diagnosis not present

## 2015-05-30 DIAGNOSIS — T8579XD Infection and inflammatory reaction due to other internal prosthetic devices, implants and grafts, subsequent encounter: Secondary | ICD-10-CM | POA: Diagnosis not present

## 2015-05-30 DIAGNOSIS — T8189XD Other complications of procedures, not elsewhere classified, subsequent encounter: Secondary | ICD-10-CM | POA: Diagnosis not present

## 2015-05-30 DIAGNOSIS — E669 Obesity, unspecified: Secondary | ICD-10-CM | POA: Diagnosis not present

## 2015-06-01 DIAGNOSIS — T8189XD Other complications of procedures, not elsewhere classified, subsequent encounter: Secondary | ICD-10-CM | POA: Diagnosis not present

## 2015-06-01 DIAGNOSIS — E669 Obesity, unspecified: Secondary | ICD-10-CM | POA: Diagnosis not present

## 2015-06-01 DIAGNOSIS — E1165 Type 2 diabetes mellitus with hyperglycemia: Secondary | ICD-10-CM | POA: Diagnosis not present

## 2015-06-01 DIAGNOSIS — N261 Atrophy of kidney (terminal): Secondary | ICD-10-CM | POA: Diagnosis not present

## 2015-06-01 DIAGNOSIS — L03311 Cellulitis of abdominal wall: Secondary | ICD-10-CM | POA: Diagnosis not present

## 2015-06-01 DIAGNOSIS — T8579XD Infection and inflammatory reaction due to other internal prosthetic devices, implants and grafts, subsequent encounter: Secondary | ICD-10-CM | POA: Diagnosis not present

## 2015-06-03 DIAGNOSIS — T8579XD Infection and inflammatory reaction due to other internal prosthetic devices, implants and grafts, subsequent encounter: Secondary | ICD-10-CM | POA: Diagnosis not present

## 2015-06-03 DIAGNOSIS — N261 Atrophy of kidney (terminal): Secondary | ICD-10-CM | POA: Diagnosis not present

## 2015-06-03 DIAGNOSIS — E669 Obesity, unspecified: Secondary | ICD-10-CM | POA: Diagnosis not present

## 2015-06-03 DIAGNOSIS — L03311 Cellulitis of abdominal wall: Secondary | ICD-10-CM | POA: Diagnosis not present

## 2015-06-03 DIAGNOSIS — T8189XD Other complications of procedures, not elsewhere classified, subsequent encounter: Secondary | ICD-10-CM | POA: Diagnosis not present

## 2015-06-03 DIAGNOSIS — E1165 Type 2 diabetes mellitus with hyperglycemia: Secondary | ICD-10-CM | POA: Diagnosis not present

## 2015-06-06 DIAGNOSIS — T8579XD Infection and inflammatory reaction due to other internal prosthetic devices, implants and grafts, subsequent encounter: Secondary | ICD-10-CM | POA: Diagnosis not present

## 2015-06-06 DIAGNOSIS — N261 Atrophy of kidney (terminal): Secondary | ICD-10-CM | POA: Diagnosis not present

## 2015-06-06 DIAGNOSIS — L03311 Cellulitis of abdominal wall: Secondary | ICD-10-CM | POA: Diagnosis not present

## 2015-06-06 DIAGNOSIS — E1165 Type 2 diabetes mellitus with hyperglycemia: Secondary | ICD-10-CM | POA: Diagnosis not present

## 2015-06-06 DIAGNOSIS — T8189XD Other complications of procedures, not elsewhere classified, subsequent encounter: Secondary | ICD-10-CM | POA: Diagnosis not present

## 2015-06-06 DIAGNOSIS — E669 Obesity, unspecified: Secondary | ICD-10-CM | POA: Diagnosis not present

## 2015-06-08 DIAGNOSIS — E781 Pure hyperglyceridemia: Secondary | ICD-10-CM | POA: Diagnosis not present

## 2015-06-08 DIAGNOSIS — E1165 Type 2 diabetes mellitus with hyperglycemia: Secondary | ICD-10-CM | POA: Diagnosis not present

## 2015-06-08 DIAGNOSIS — Z794 Long term (current) use of insulin: Secondary | ICD-10-CM | POA: Diagnosis not present

## 2015-06-08 DIAGNOSIS — D649 Anemia, unspecified: Secondary | ICD-10-CM | POA: Diagnosis not present

## 2015-06-08 DIAGNOSIS — L03311 Cellulitis of abdominal wall: Secondary | ICD-10-CM | POA: Diagnosis not present

## 2015-06-08 DIAGNOSIS — S31109D Unspecified open wound of abdominal wall, unspecified quadrant without penetration into peritoneal cavity, subsequent encounter: Secondary | ICD-10-CM | POA: Diagnosis not present

## 2015-06-08 DIAGNOSIS — E669 Obesity, unspecified: Secondary | ICD-10-CM | POA: Diagnosis not present

## 2015-06-10 DIAGNOSIS — E1165 Type 2 diabetes mellitus with hyperglycemia: Secondary | ICD-10-CM | POA: Diagnosis not present

## 2015-06-10 DIAGNOSIS — N261 Atrophy of kidney (terminal): Secondary | ICD-10-CM | POA: Diagnosis not present

## 2015-06-10 DIAGNOSIS — E669 Obesity, unspecified: Secondary | ICD-10-CM | POA: Diagnosis not present

## 2015-06-10 DIAGNOSIS — T8189XD Other complications of procedures, not elsewhere classified, subsequent encounter: Secondary | ICD-10-CM | POA: Diagnosis not present

## 2015-06-10 DIAGNOSIS — L03311 Cellulitis of abdominal wall: Secondary | ICD-10-CM | POA: Diagnosis not present

## 2015-06-10 DIAGNOSIS — T8579XD Infection and inflammatory reaction due to other internal prosthetic devices, implants and grafts, subsequent encounter: Secondary | ICD-10-CM | POA: Diagnosis not present

## 2015-06-13 DIAGNOSIS — T8579XD Infection and inflammatory reaction due to other internal prosthetic devices, implants and grafts, subsequent encounter: Secondary | ICD-10-CM | POA: Diagnosis not present

## 2015-06-13 DIAGNOSIS — L03311 Cellulitis of abdominal wall: Secondary | ICD-10-CM | POA: Diagnosis not present

## 2015-06-13 DIAGNOSIS — E669 Obesity, unspecified: Secondary | ICD-10-CM | POA: Diagnosis not present

## 2015-06-13 DIAGNOSIS — E1165 Type 2 diabetes mellitus with hyperglycemia: Secondary | ICD-10-CM | POA: Diagnosis not present

## 2015-06-13 DIAGNOSIS — T8189XD Other complications of procedures, not elsewhere classified, subsequent encounter: Secondary | ICD-10-CM | POA: Diagnosis not present

## 2015-06-13 DIAGNOSIS — N261 Atrophy of kidney (terminal): Secondary | ICD-10-CM | POA: Diagnosis not present

## 2015-06-15 DIAGNOSIS — T8579XD Infection and inflammatory reaction due to other internal prosthetic devices, implants and grafts, subsequent encounter: Secondary | ICD-10-CM | POA: Diagnosis not present

## 2015-06-15 DIAGNOSIS — E669 Obesity, unspecified: Secondary | ICD-10-CM | POA: Diagnosis not present

## 2015-06-15 DIAGNOSIS — T8189XD Other complications of procedures, not elsewhere classified, subsequent encounter: Secondary | ICD-10-CM | POA: Diagnosis not present

## 2015-06-15 DIAGNOSIS — N261 Atrophy of kidney (terminal): Secondary | ICD-10-CM | POA: Diagnosis not present

## 2015-06-15 DIAGNOSIS — E1165 Type 2 diabetes mellitus with hyperglycemia: Secondary | ICD-10-CM | POA: Diagnosis not present

## 2015-06-15 DIAGNOSIS — L03311 Cellulitis of abdominal wall: Secondary | ICD-10-CM | POA: Diagnosis not present

## 2015-06-17 DIAGNOSIS — L03311 Cellulitis of abdominal wall: Secondary | ICD-10-CM | POA: Diagnosis not present

## 2015-06-17 DIAGNOSIS — E669 Obesity, unspecified: Secondary | ICD-10-CM | POA: Diagnosis not present

## 2015-06-17 DIAGNOSIS — E1165 Type 2 diabetes mellitus with hyperglycemia: Secondary | ICD-10-CM | POA: Diagnosis not present

## 2015-06-17 DIAGNOSIS — T8579XD Infection and inflammatory reaction due to other internal prosthetic devices, implants and grafts, subsequent encounter: Secondary | ICD-10-CM | POA: Diagnosis not present

## 2015-06-17 DIAGNOSIS — T8189XD Other complications of procedures, not elsewhere classified, subsequent encounter: Secondary | ICD-10-CM | POA: Diagnosis not present

## 2015-06-17 DIAGNOSIS — N261 Atrophy of kidney (terminal): Secondary | ICD-10-CM | POA: Diagnosis not present

## 2015-06-20 DIAGNOSIS — T8189XD Other complications of procedures, not elsewhere classified, subsequent encounter: Secondary | ICD-10-CM | POA: Diagnosis not present

## 2015-06-20 DIAGNOSIS — T8579XD Infection and inflammatory reaction due to other internal prosthetic devices, implants and grafts, subsequent encounter: Secondary | ICD-10-CM | POA: Diagnosis not present

## 2015-06-20 DIAGNOSIS — E1165 Type 2 diabetes mellitus with hyperglycemia: Secondary | ICD-10-CM | POA: Diagnosis not present

## 2015-06-20 DIAGNOSIS — E669 Obesity, unspecified: Secondary | ICD-10-CM | POA: Diagnosis not present

## 2015-06-20 DIAGNOSIS — N261 Atrophy of kidney (terminal): Secondary | ICD-10-CM | POA: Diagnosis not present

## 2015-06-20 DIAGNOSIS — L03311 Cellulitis of abdominal wall: Secondary | ICD-10-CM | POA: Diagnosis not present

## 2015-06-21 ENCOUNTER — Telehealth: Payer: Self-pay | Admitting: Internal Medicine

## 2015-06-21 NOTE — Telephone Encounter (Signed)
Insulin Glargine (LANTUS SOLOSTAR) 100 UNIT/ML Solostar Pen qty 31 ml

## 2015-06-22 DIAGNOSIS — E669 Obesity, unspecified: Secondary | ICD-10-CM | POA: Diagnosis not present

## 2015-06-22 DIAGNOSIS — T8579XD Infection and inflammatory reaction due to other internal prosthetic devices, implants and grafts, subsequent encounter: Secondary | ICD-10-CM | POA: Diagnosis not present

## 2015-06-22 DIAGNOSIS — E1165 Type 2 diabetes mellitus with hyperglycemia: Secondary | ICD-10-CM | POA: Diagnosis not present

## 2015-06-22 DIAGNOSIS — N261 Atrophy of kidney (terminal): Secondary | ICD-10-CM | POA: Diagnosis not present

## 2015-06-22 DIAGNOSIS — T8189XD Other complications of procedures, not elsewhere classified, subsequent encounter: Secondary | ICD-10-CM | POA: Diagnosis not present

## 2015-06-22 DIAGNOSIS — L03311 Cellulitis of abdominal wall: Secondary | ICD-10-CM | POA: Diagnosis not present

## 2015-06-24 DIAGNOSIS — L03311 Cellulitis of abdominal wall: Secondary | ICD-10-CM | POA: Diagnosis not present

## 2015-06-24 DIAGNOSIS — T8579XD Infection and inflammatory reaction due to other internal prosthetic devices, implants and grafts, subsequent encounter: Secondary | ICD-10-CM | POA: Diagnosis not present

## 2015-06-24 DIAGNOSIS — E1165 Type 2 diabetes mellitus with hyperglycemia: Secondary | ICD-10-CM | POA: Diagnosis not present

## 2015-06-24 DIAGNOSIS — N261 Atrophy of kidney (terminal): Secondary | ICD-10-CM | POA: Diagnosis not present

## 2015-06-24 DIAGNOSIS — E669 Obesity, unspecified: Secondary | ICD-10-CM | POA: Diagnosis not present

## 2015-06-24 DIAGNOSIS — T8189XD Other complications of procedures, not elsewhere classified, subsequent encounter: Secondary | ICD-10-CM | POA: Diagnosis not present

## 2015-06-27 DIAGNOSIS — T8189XD Other complications of procedures, not elsewhere classified, subsequent encounter: Secondary | ICD-10-CM | POA: Diagnosis not present

## 2015-06-27 DIAGNOSIS — E669 Obesity, unspecified: Secondary | ICD-10-CM | POA: Diagnosis not present

## 2015-06-27 DIAGNOSIS — E1165 Type 2 diabetes mellitus with hyperglycemia: Secondary | ICD-10-CM | POA: Diagnosis not present

## 2015-06-27 DIAGNOSIS — N261 Atrophy of kidney (terminal): Secondary | ICD-10-CM | POA: Diagnosis not present

## 2015-06-27 DIAGNOSIS — L03311 Cellulitis of abdominal wall: Secondary | ICD-10-CM | POA: Diagnosis not present

## 2015-06-27 DIAGNOSIS — T8579XD Infection and inflammatory reaction due to other internal prosthetic devices, implants and grafts, subsequent encounter: Secondary | ICD-10-CM | POA: Diagnosis not present

## 2015-06-29 DIAGNOSIS — L03311 Cellulitis of abdominal wall: Secondary | ICD-10-CM | POA: Diagnosis not present

## 2015-06-29 DIAGNOSIS — T8579XD Infection and inflammatory reaction due to other internal prosthetic devices, implants and grafts, subsequent encounter: Secondary | ICD-10-CM | POA: Diagnosis not present

## 2015-06-29 DIAGNOSIS — E1165 Type 2 diabetes mellitus with hyperglycemia: Secondary | ICD-10-CM | POA: Diagnosis not present

## 2015-06-29 DIAGNOSIS — T8189XD Other complications of procedures, not elsewhere classified, subsequent encounter: Secondary | ICD-10-CM | POA: Diagnosis not present

## 2015-06-29 DIAGNOSIS — E669 Obesity, unspecified: Secondary | ICD-10-CM | POA: Diagnosis not present

## 2015-06-29 DIAGNOSIS — N261 Atrophy of kidney (terminal): Secondary | ICD-10-CM | POA: Diagnosis not present

## 2015-06-30 DIAGNOSIS — T8189XD Other complications of procedures, not elsewhere classified, subsequent encounter: Secondary | ICD-10-CM | POA: Diagnosis not present

## 2015-06-30 DIAGNOSIS — S31109D Unspecified open wound of abdominal wall, unspecified quadrant without penetration into peritoneal cavity, subsequent encounter: Secondary | ICD-10-CM | POA: Diagnosis not present

## 2015-06-30 DIAGNOSIS — E1165 Type 2 diabetes mellitus with hyperglycemia: Secondary | ICD-10-CM | POA: Diagnosis not present

## 2015-06-30 DIAGNOSIS — K6389 Other specified diseases of intestine: Secondary | ICD-10-CM | POA: Diagnosis not present

## 2015-06-30 DIAGNOSIS — Z9889 Other specified postprocedural states: Secondary | ICD-10-CM | POA: Diagnosis not present

## 2015-07-01 DIAGNOSIS — T8189XD Other complications of procedures, not elsewhere classified, subsequent encounter: Secondary | ICD-10-CM | POA: Diagnosis not present

## 2015-07-01 DIAGNOSIS — T8579XD Infection and inflammatory reaction due to other internal prosthetic devices, implants and grafts, subsequent encounter: Secondary | ICD-10-CM | POA: Diagnosis not present

## 2015-07-01 DIAGNOSIS — N261 Atrophy of kidney (terminal): Secondary | ICD-10-CM | POA: Diagnosis not present

## 2015-07-01 DIAGNOSIS — L03311 Cellulitis of abdominal wall: Secondary | ICD-10-CM | POA: Diagnosis not present

## 2015-07-01 DIAGNOSIS — E1165 Type 2 diabetes mellitus with hyperglycemia: Secondary | ICD-10-CM | POA: Diagnosis not present

## 2015-07-01 DIAGNOSIS — E669 Obesity, unspecified: Secondary | ICD-10-CM | POA: Diagnosis not present

## 2015-07-04 ENCOUNTER — Ambulatory Visit: Payer: Medicare Other | Admitting: Internal Medicine

## 2015-07-04 DIAGNOSIS — E1165 Type 2 diabetes mellitus with hyperglycemia: Secondary | ICD-10-CM | POA: Diagnosis not present

## 2015-07-04 DIAGNOSIS — T8579XD Infection and inflammatory reaction due to other internal prosthetic devices, implants and grafts, subsequent encounter: Secondary | ICD-10-CM | POA: Diagnosis not present

## 2015-07-04 DIAGNOSIS — L03311 Cellulitis of abdominal wall: Secondary | ICD-10-CM | POA: Diagnosis not present

## 2015-07-04 DIAGNOSIS — T8189XD Other complications of procedures, not elsewhere classified, subsequent encounter: Secondary | ICD-10-CM | POA: Diagnosis not present

## 2015-07-04 DIAGNOSIS — E669 Obesity, unspecified: Secondary | ICD-10-CM | POA: Diagnosis not present

## 2015-07-04 DIAGNOSIS — N261 Atrophy of kidney (terminal): Secondary | ICD-10-CM | POA: Diagnosis not present

## 2015-07-06 DIAGNOSIS — L03311 Cellulitis of abdominal wall: Secondary | ICD-10-CM | POA: Diagnosis not present

## 2015-07-06 DIAGNOSIS — E669 Obesity, unspecified: Secondary | ICD-10-CM | POA: Diagnosis not present

## 2015-07-06 DIAGNOSIS — T8579XD Infection and inflammatory reaction due to other internal prosthetic devices, implants and grafts, subsequent encounter: Secondary | ICD-10-CM | POA: Diagnosis not present

## 2015-07-06 DIAGNOSIS — N261 Atrophy of kidney (terminal): Secondary | ICD-10-CM | POA: Diagnosis not present

## 2015-07-06 DIAGNOSIS — E1165 Type 2 diabetes mellitus with hyperglycemia: Secondary | ICD-10-CM | POA: Diagnosis not present

## 2015-07-06 DIAGNOSIS — T8189XD Other complications of procedures, not elsewhere classified, subsequent encounter: Secondary | ICD-10-CM | POA: Diagnosis not present

## 2015-07-08 DIAGNOSIS — E1165 Type 2 diabetes mellitus with hyperglycemia: Secondary | ICD-10-CM | POA: Diagnosis not present

## 2015-07-08 DIAGNOSIS — T8579XD Infection and inflammatory reaction due to other internal prosthetic devices, implants and grafts, subsequent encounter: Secondary | ICD-10-CM | POA: Diagnosis not present

## 2015-07-08 DIAGNOSIS — N261 Atrophy of kidney (terminal): Secondary | ICD-10-CM | POA: Diagnosis not present

## 2015-07-08 DIAGNOSIS — L03311 Cellulitis of abdominal wall: Secondary | ICD-10-CM | POA: Diagnosis not present

## 2015-07-08 DIAGNOSIS — T8189XD Other complications of procedures, not elsewhere classified, subsequent encounter: Secondary | ICD-10-CM | POA: Diagnosis not present

## 2015-07-08 DIAGNOSIS — E669 Obesity, unspecified: Secondary | ICD-10-CM | POA: Diagnosis not present

## 2015-07-11 DIAGNOSIS — E1165 Type 2 diabetes mellitus with hyperglycemia: Secondary | ICD-10-CM | POA: Diagnosis not present

## 2015-07-11 DIAGNOSIS — T8579XD Infection and inflammatory reaction due to other internal prosthetic devices, implants and grafts, subsequent encounter: Secondary | ICD-10-CM | POA: Diagnosis not present

## 2015-07-11 DIAGNOSIS — L03311 Cellulitis of abdominal wall: Secondary | ICD-10-CM | POA: Diagnosis not present

## 2015-07-11 DIAGNOSIS — N261 Atrophy of kidney (terminal): Secondary | ICD-10-CM | POA: Diagnosis not present

## 2015-07-11 DIAGNOSIS — E669 Obesity, unspecified: Secondary | ICD-10-CM | POA: Diagnosis not present

## 2015-07-11 DIAGNOSIS — T8189XD Other complications of procedures, not elsewhere classified, subsequent encounter: Secondary | ICD-10-CM | POA: Diagnosis not present

## 2015-07-13 ENCOUNTER — Other Ambulatory Visit: Payer: Self-pay | Admitting: Internal Medicine

## 2015-07-13 DIAGNOSIS — E669 Obesity, unspecified: Secondary | ICD-10-CM | POA: Diagnosis not present

## 2015-07-13 DIAGNOSIS — N261 Atrophy of kidney (terminal): Secondary | ICD-10-CM | POA: Diagnosis not present

## 2015-07-13 DIAGNOSIS — E1165 Type 2 diabetes mellitus with hyperglycemia: Secondary | ICD-10-CM | POA: Diagnosis not present

## 2015-07-13 DIAGNOSIS — L03311 Cellulitis of abdominal wall: Secondary | ICD-10-CM | POA: Diagnosis not present

## 2015-07-13 DIAGNOSIS — T8189XD Other complications of procedures, not elsewhere classified, subsequent encounter: Secondary | ICD-10-CM | POA: Diagnosis not present

## 2015-07-13 DIAGNOSIS — T8579XD Infection and inflammatory reaction due to other internal prosthetic devices, implants and grafts, subsequent encounter: Secondary | ICD-10-CM | POA: Diagnosis not present

## 2015-07-13 NOTE — Telephone Encounter (Signed)
Patient called requesting to speak to nurse regarding medication refill for Insulin Glargine (LANTUS SOLOSTAR) 100 UNIT/ML Solostar Pen, Patient is under M.A.P program with health dept to receive free medication. Patient provided ph number [phone] and they need renewal of rx. Please f/u with patient

## 2015-07-14 ENCOUNTER — Other Ambulatory Visit: Payer: Self-pay | Admitting: *Deleted

## 2015-07-14 DIAGNOSIS — IMO0002 Reserved for concepts with insufficient information to code with codable children: Secondary | ICD-10-CM

## 2015-07-14 DIAGNOSIS — E1165 Type 2 diabetes mellitus with hyperglycemia: Secondary | ICD-10-CM

## 2015-07-14 MED ORDER — INSULIN GLARGINE 100 UNIT/ML SOLOSTAR PEN: 80.0000 [IU] | PEN_INJECTOR | Freq: Every day | SUBCUTANEOUS | Status: DC

## 2015-07-15 DIAGNOSIS — T8189XD Other complications of procedures, not elsewhere classified, subsequent encounter: Secondary | ICD-10-CM | POA: Diagnosis not present

## 2015-07-15 DIAGNOSIS — T8579XD Infection and inflammatory reaction due to other internal prosthetic devices, implants and grafts, subsequent encounter: Secondary | ICD-10-CM | POA: Diagnosis not present

## 2015-07-15 DIAGNOSIS — L03311 Cellulitis of abdominal wall: Secondary | ICD-10-CM | POA: Diagnosis not present

## 2015-07-15 DIAGNOSIS — E1165 Type 2 diabetes mellitus with hyperglycemia: Secondary | ICD-10-CM | POA: Diagnosis not present

## 2015-07-15 DIAGNOSIS — E669 Obesity, unspecified: Secondary | ICD-10-CM | POA: Diagnosis not present

## 2015-07-15 DIAGNOSIS — N261 Atrophy of kidney (terminal): Secondary | ICD-10-CM | POA: Diagnosis not present

## 2015-07-18 DIAGNOSIS — T8189XD Other complications of procedures, not elsewhere classified, subsequent encounter: Secondary | ICD-10-CM | POA: Diagnosis not present

## 2015-07-18 DIAGNOSIS — N261 Atrophy of kidney (terminal): Secondary | ICD-10-CM | POA: Diagnosis not present

## 2015-07-18 DIAGNOSIS — E119 Type 2 diabetes mellitus without complications: Secondary | ICD-10-CM | POA: Diagnosis not present

## 2015-07-18 DIAGNOSIS — E669 Obesity, unspecified: Secondary | ICD-10-CM | POA: Diagnosis not present

## 2015-07-18 DIAGNOSIS — T8579XD Infection and inflammatory reaction due to other internal prosthetic devices, implants and grafts, subsequent encounter: Secondary | ICD-10-CM | POA: Diagnosis not present

## 2015-07-18 DIAGNOSIS — L03311 Cellulitis of abdominal wall: Secondary | ICD-10-CM | POA: Diagnosis not present

## 2015-07-18 DIAGNOSIS — E1165 Type 2 diabetes mellitus with hyperglycemia: Secondary | ICD-10-CM | POA: Diagnosis not present

## 2015-07-18 LAB — HM DIABETES EYE EXAM

## 2015-07-20 DIAGNOSIS — T8189XD Other complications of procedures, not elsewhere classified, subsequent encounter: Secondary | ICD-10-CM | POA: Diagnosis not present

## 2015-07-20 DIAGNOSIS — Z6836 Body mass index (BMI) 36.0-36.9, adult: Secondary | ICD-10-CM | POA: Diagnosis not present

## 2015-07-20 DIAGNOSIS — N261 Atrophy of kidney (terminal): Secondary | ICD-10-CM | POA: Diagnosis not present

## 2015-07-20 DIAGNOSIS — E669 Obesity, unspecified: Secondary | ICD-10-CM | POA: Diagnosis not present

## 2015-07-20 DIAGNOSIS — E1165 Type 2 diabetes mellitus with hyperglycemia: Secondary | ICD-10-CM | POA: Diagnosis not present

## 2015-07-20 DIAGNOSIS — T8579XD Infection and inflammatory reaction due to other internal prosthetic devices, implants and grafts, subsequent encounter: Secondary | ICD-10-CM | POA: Diagnosis not present

## 2015-07-21 ENCOUNTER — Encounter (HOSPITAL_BASED_OUTPATIENT_CLINIC_OR_DEPARTMENT_OTHER): Payer: Medicare Other | Admitting: Clinical

## 2015-07-21 ENCOUNTER — Encounter: Payer: Self-pay | Admitting: Internal Medicine

## 2015-07-21 ENCOUNTER — Ambulatory Visit: Payer: Medicare Other | Attending: Internal Medicine | Admitting: Internal Medicine

## 2015-07-21 VITALS — BP 157/73 | Temp 98.2°F | Resp 18 | Ht 67.0 in | Wt 236.8 lb

## 2015-07-21 DIAGNOSIS — Z79899 Other long term (current) drug therapy: Secondary | ICD-10-CM | POA: Diagnosis not present

## 2015-07-21 DIAGNOSIS — F329 Major depressive disorder, single episode, unspecified: Secondary | ICD-10-CM | POA: Diagnosis not present

## 2015-07-21 DIAGNOSIS — Z85038 Personal history of other malignant neoplasm of large intestine: Secondary | ICD-10-CM | POA: Diagnosis not present

## 2015-07-21 DIAGNOSIS — Z794 Long term (current) use of insulin: Secondary | ICD-10-CM | POA: Diagnosis not present

## 2015-07-21 DIAGNOSIS — Z888 Allergy status to other drugs, medicaments and biological substances status: Secondary | ICD-10-CM | POA: Insufficient documentation

## 2015-07-21 DIAGNOSIS — F32A Depression, unspecified: Secondary | ICD-10-CM | POA: Insufficient documentation

## 2015-07-21 DIAGNOSIS — I10 Essential (primary) hypertension: Secondary | ICD-10-CM | POA: Diagnosis not present

## 2015-07-21 DIAGNOSIS — I129 Hypertensive chronic kidney disease with stage 1 through stage 4 chronic kidney disease, or unspecified chronic kidney disease: Secondary | ICD-10-CM | POA: Diagnosis not present

## 2015-07-21 DIAGNOSIS — E1165 Type 2 diabetes mellitus with hyperglycemia: Secondary | ICD-10-CM | POA: Diagnosis not present

## 2015-07-21 DIAGNOSIS — E1122 Type 2 diabetes mellitus with diabetic chronic kidney disease: Secondary | ICD-10-CM | POA: Diagnosis not present

## 2015-07-21 DIAGNOSIS — E119 Type 2 diabetes mellitus without complications: Secondary | ICD-10-CM | POA: Diagnosis not present

## 2015-07-21 LAB — POCT GLYCOSYLATED HEMOGLOBIN (HGB A1C): HEMOGLOBIN A1C: 9.1

## 2015-07-21 LAB — GLUCOSE, POCT (MANUAL RESULT ENTRY): POC GLUCOSE: 249 mg/dL — AB (ref 70–99)

## 2015-07-21 NOTE — Progress Notes (Signed)
Patient here for 6 week follow-up. Patient denies pain today. Patient is discouraged due to weight, CBG, and A1C levels not being at the best level. Patient is extremely upset, states " I am suicidal" and burst into tears. Rechecked blood pressure 162/78

## 2015-07-21 NOTE — Patient Instructions (Signed)
Diabetes and Exercise Exercising regularly is important. It is not just about losing weight. It has many health benefits, such as:  Improving your overall fitness, flexibility, and endurance.  Increasing your bone density.  Helping with weight control.  Decreasing your body fat.  Increasing your muscle strength.  Reducing stress and tension.  Improving your overall health. People with diabetes who exercise gain additional benefits because exercise:  Reduces appetite.  Improves the body's use of blood sugar (glucose).  Helps lower or control blood glucose.  Decreases blood pressure.  Helps control blood lipids (such as cholesterol and triglycerides).  Improves the body's use of the hormone insulin by:  Increasing the body's insulin sensitivity.  Reducing the body's insulin needs.  Decreases the risk for heart disease because exercising:  Lowers cholesterol and triglycerides levels.  Increases the levels of good cholesterol (such as high-density lipoproteins [HDL]) in the body.  Lowers blood glucose levels. YOUR ACTIVITY PLAN  Choose an activity that you enjoy and set realistic goals. Your health care provider or diabetes educator can help you make an activity plan that works for you. Exercise regularly as directed by your health care provider. This includes:  Performing resistance training twice a week such as push-ups, sit-ups, lifting weights, or using resistance bands.  Performing 150 minutes of cardio exercises each week such as walking, running, or playing sports.  Staying active and spending no more than 90 minutes at one time being inactive. Even short bursts of exercise are good for you. Three 10-minute sessions spread throughout the day are just as beneficial as a single 30-minute session. Some exercise ideas include:  Taking the dog for a walk.  Taking the stairs instead of the elevator.  Dancing to your favorite song.  Doing an exercise  video.  Doing your favorite exercise with a friend. RECOMMENDATIONS FOR EXERCISING WITH TYPE 1 OR TYPE 2 DIABETES   Check your blood glucose before exercising. If blood glucose levels are greater than 240 mg/dL, check for urine ketones. Do not exercise if ketones are present.  Avoid injecting insulin into areas of the body that are going to be exercised. For example, avoid injecting insulin into:  The arms when playing tennis.  The legs when jogging.  Keep a record of:  Food intake before and after you exercise.  Expected peak times of insulin action.  Blood glucose levels before and after you exercise.  The type and amount of exercise you have done.  Review your records with your health care provider. Your health care provider will help you to develop guidelines for adjusting food intake and insulin amounts before and after exercising.  If you take insulin or oral hypoglycemic agents, watch for signs and symptoms of hypoglycemia. They include:  Dizziness.  Shaking.  Sweating.  Chills.  Confusion.  Drink plenty of water while you exercise to prevent dehydration or heat stroke. Body water is lost during exercise and must be replaced.  Talk to your health care provider before starting an exercise program to make sure it is safe for you. Remember, almost any type of activity is better than none. Document Released: 01/19/2004 Document Revised: 03/15/2014 Document Reviewed: 04/07/2013 ExitCare Patient Information 2015 [employer]. This information is not intended to replace advice given to you by your health care provider. Make sure you discuss any questions you have with your health care provider. Basic Carbohydrate Counting for Diabetes Mellitus Carbohydrate counting is a method for keeping track of the amount of carbohydrates you eat.   Eating carbohydrates naturally increases the level of sugar (glucose) in your blood, so it is important for you to know the amount that is  okay for you to have in every meal. Carbohydrate counting helps keep the level of glucose in your blood within normal limits. The amount of carbohydrates allowed is different for every person. A dietitian can help you calculate the amount that is right for you. Once you know the amount of carbohydrates you can have, you can count the carbohydrates in the foods you want to eat. Carbohydrates are found in the following foods:  Grains, such as breads and cereals.  Dried beans and soy products.  Starchy vegetables, such as potatoes, peas, and corn.  Fruit and fruit juices.  Milk and yogurt.  Sweets and snack foods, such as cake, cookies, candy, chips, soft drinks, and fruit drinks. CARBOHYDRATE COUNTING There are two ways to count the carbohydrates in your food. You can use either of the methods or a combination of both. Reading the "Nutrition Facts" on Packaged Food The "Nutrition Facts" is an area that is included on the labels of almost all packaged food and beverages in the [place]. It includes the serving size of that food or beverage and information about the nutrients in each serving of the food, including the grams (g) of carbohydrate per serving.  Decide the number of servings of this food or beverage that you will be able to eat or drink. Multiply that number of servings by the number of grams of carbohydrate that is listed on the label for that serving. The total will be the amount of carbohydrates you will be having when you eat or drink this food or beverage. Learning Standard Serving Sizes of Food When you eat food that is not packaged or does not include "Nutrition Facts" on the label, you need to measure the servings in order to count the amount of carbohydrates.A serving of most carbohydrate-rich foods contains about 15 g of carbohydrates. The following list includes serving sizes of carbohydrate-rich foods that provide 15 g ofcarbohydrate per serving:   1 slice of bread  (1 oz) or 1 six-inch tortilla.    of a hamburger bun or English muffin.  4-6 crackers.   cup unsweetened dry cereal.    cup hot cereal.   cup rice or pasta.    cup mashed potatoes or  of a large baked potato.  1 cup fresh fruit or one small piece of fruit.    cup canned or frozen fruit or fruit juice.  1 cup milk.   cup plain fat-free yogurt or yogurt sweetened with artificial sweeteners.   cup cooked dried beans or starchy vegetable, such as peas, corn, or potatoes.  Decide the number of standard-size servings that you will eat. Multiply that number of servings by 15 (the grams of carbohydrates in that serving). For example, if you eat 2 cups of strawberries, you will have eaten 2 servings and 30 g of carbohydrates (2 servings x 15 g = 30 g). For foods such as soups and casseroles, in which more than one food is mixed in, you will need to count the carbohydrates in each food that is included. EXAMPLE OF CARBOHYDRATE COUNTING Sample Dinner  3 oz chicken breast.   cup of brown rice.   cup of corn.  1 cup milk.   1 cup strawberries with sugar-free whipped topping.  Carbohydrate Calculation Step 1: Identify the foods that contain carbohydrates:   Rice.   Corn.     Milk.   Strawberries. Step 2:Calculate the number of servings eaten of each:   2 servings of rice.   1 serving of corn.   1 serving of milk.   1 serving of strawberries. Step 3: Multiply each of those number of servings by 15 g:   2 servings of rice x 15 g = 30 g.   1 serving of corn x 15 g = 15 g.   1 serving of milk x 15 g = 15 g.   1 serving of strawberries x 15 g = 15 g. Step 4: Add together all of the amounts to find the total grams of carbohydrates eaten: 30 g + 15 g + 15 g + 15 g = 75 g. Document Released: 10/29/2005 Document Revised: 03/15/2014 Document Reviewed: 09/25/2013 ExitCare Patient Information 2015 [employer]. This information is not intended to  replace advice given to you by your health care provider. Make sure you discuss any questions you have with your health care provider.  

## 2015-07-21 NOTE — Progress Notes (Signed)
ASSESSMENT: Pt currently experiencing symptoms of depression. Pt needs to f/u with PCP and [facility]; would benefit from psychoeducation and supportive counseling regarding coping with symptoms of depression.  Stage of Change: precontemplation  PLAN: 1. F/U with behavioral health consultant in as needed 2. Psychiatric Medications: none. 3. Behavioral recommendation(s):   -Consider reading educational material regarding coping with symptoms of depression -Consider calling Mobile Crisis number if she feels she is in crisis SUBJECTIVE: Pt. referred by Dr Doreene Burke for suicide ideation:  Pt. reports the following symptoms/concerns: Pt states that she does not want to kill herself today, but that she is very disappointed that her A1C level is not where she wants it, and that she gained weight instead of losing it. "My diet has never been better". She says her family has been supportive. She does not have a plan, and says she has not had a previous suicide attempt. She denies feeling suicidal previously.  Duration of problem: today Severity: severe  OBJECTIVE: Orientation & Cognition: Oriented x3. Thought processes normal and appropriate to situation. Mood: teary, irritable. Affect: appropriate Appearance: appropriate Risk of harm to self or others: no risk of harm to self or others today Substance use: none Assessments administered: refused to complete  Diagnosis: Depression CPT Code: F32.9 -------------------------------------------- Other(s) present in the room: none  Time spent with patient in exam room: 20 minutes

## 2015-07-21 NOTE — Progress Notes (Signed)
Patient ID: Catherine Fowler, female   DOB: 08-14-1951, 64 y.o.   MRN: [id number]   Catherine Fowler, is a 64 y.o. female  [id number]  HRC:[id number]  DOB - December 29, 1950  Chief Complaint  Patient presents with  . Follow-up        Subjective:   Catherine Fowler is a 64 y.o. female with a history of type 2 diabetes mellitus, chronic kidney disease, colon cancer status post laparoscopic resection of sigmoid colonic here today for a follow up visit. Patient has an open Abdominal Wound since surgery which has refused to heal and patient is awaiting hemoglobin A1c to go down below 8% before secondary closure can be attempted. Patient is really frustrated because she has tried everything possible for her blood sugar control, yet A1c remains above 9%. Patient is very tearful today, saying "what else do I need to do?", "kill myself?". Patient says she is depressed and frustrated because she is tired of living with the smell of her wound and inability to go around and about as needed. She denies any outright plan for suicide, she denies any homicidal ideation. Patient has No headache, No chest pain, No Nausea, No new weakness tingling or numbness, No Cough - SOB.  Surgical history She underwent a screening colonoscopy at [facility] on 09/18/2013. An 8 mm polyp was removed from the a sending colon. To 1.57 or polyps were found at the hepatic flexure. These were removed. A mass was found in the descending colon suspicious for malignancy. The tumor was fungating and measured 5 cm. Multiple biopsies were performed. A single 15 mm polyp was found in the sigmoid colon. A polypectomy was performed. Numerous other polyps were seen in the transverse colon that were not removed.  The pathology tubular adenomas at the right colon and hepatic flexure. 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  She was referred to Dr. Charma Igo and  was taken the operating room on 10/23/2013 for a laparoscopic resection of the sigmoid colon tumor. There was a large adhesion burden between the small bowel and anterior abdominal wall with prosthetic mesh. The palpable sigmoid mass was adherent to the anterior abdominal wall.  The pathology ([id number]) confirmed a moderately differentiated adenocarcinoma invasive into but not through the muscularis propria. 24 pericolonic lymph nodes were negative for tumor. The resection margins were negative. No lymphovascular or perineural invasion. No tumor deposits. No macroscopic tumor perforation.   Problem  Depression (Emotion)    ALLERGIES: Allergies  Allergen Reactions  . Pravastatin Sodium Itching and Rash  . Simvastatin Rash    PAST MEDICAL HISTORY: Past Medical History  Diagnosis Date  . Diabetes mellitus   . Chronic kidney disease   . Colon cancer 09/18/13    MEDICATIONS AT HOME: Prior to Admission medications   Medication Sig Start Date End Date Taking? Authorizing Provider  fenofibrate (TRICOR) 145 MG tablet Take 1 tablet (145 mg total) by mouth daily. 12/14/14  Yes Tresa Garter, MD  glipiZIDE (GLUCOTROL) 10 MG tablet Take 1 tablet (10 mg total) by mouth 2 (two) times daily before a meal. 04/07/15  Yes Tiffancy Moger E Doreene Burke, MD  glucose monitoring kit (FREESTYLE) monitoring kit 1 each by Does not apply route 4 (four) times daily - after meals and at bedtime. 1 month Diabetic Testing Supplies for QAC-QHS accuchecks. Any brand okay 08/14/13  Yes Thurnell Lose, MD  Insulin Glargine (LANTUS SOLOSTAR) 100 UNIT/ML Solostar Pen  Inject 80 Units into the skin at bedtime. 07/14/15  Yes Tresa Garter, MD  nystatin cream (MYCOSTATIN) Apply 1 application topically 2 (two) times daily. 04/07/15  Yes Tresa Garter, MD     Objective:   Filed Vitals:   07/21/15 1544  BP: 157/73  Temp: 98.2 F (36.8 C)  TempSrc: Oral  Resp: 18  Height: _0  (1.702 m)  Weight: 236 lb 12.8 oz  (107.412 kg)  SpO2: 95%    Exam General appearance : Awake, alert, not in any distress. Speech Clear. Not toxic looking HEENT: Atraumatic and Normocephalic, pupils equally reactive to light and accomodation Neck: supple, no JVD. No cervical lymphadenopathy.  Chest:Good air entry bilaterally, no added sounds  CVS: S1 S2 regular, no murmurs.  Abdomen: Bowel sounds present, Non tender and not distended with no gaurding, rigidity or rebound. Abdominal wall wound dressing clean and dry. Extremities: B/L Lower Ext shows no edema, both legs are warm to touch Neurology: Awake alert, and oriented X 3, CN II-XII intact, Non focal Skin:No Rash  Data Review Lab Results  Component Value Date   HGBA1C 9.10 07/21/2015   HGBA1C 10.90 04/07/2015   HGBA1C 9.70 12/06/2014     Assessment & Plan   1. Type 2 diabetes mellitus with hyperglycemia  - POCT A1C - Microalbumin/Creatinine Ratio, Urine - Glucose (CBG)  Aim for 30 minutes of exercise most days. Rethink what you drink. Water is great! Aim for 2-3 Carb Choices per meal (30-45 grams) +/- 1 either way  Aim for 0-15 Carbs per snack if hungry  Include protein in moderation with your meals and snacks  Consider reading food labels for Total Carbohydrate and Fat Grams of foods  Consider checking BG at alternate times per day  Continue taking medication as directed Be mindful about how much sugar you are adding to beverages and other foods. Fruit Punch - find one with no sugar  Measure and decrease portions of carbohydrate foods  Make your plate and don't go back for seconds  2. Essential hypertension  We have discussed target BP range and blood pressure goal. I have advised patient to check BP regularly and to call us back or report to clinic if the numbers are consistently higher than 140/90. We discussed the importance of compliance with medical therapy and DASH diet recommended, consequences of uncontrolled hypertension discussed.  -  continue current BP medications  3. Depression (emotion)  Patient saw and discussed her frustrations with our LCSW today, counseling done.  Patient have been counseled extensively about nutrition and exercise  Return in about 4 weeks (around 08/18/2015) for Hemoglobin A1C and Follow up, DM, Follow up HTN, CBG, Lab/Nurse Visit.  The patient was given clear instructions to go to ER or return to medical center if symptoms don't improve, worsen or new problems develop. The patient verbalized understanding. The patient was told to call to get lab results if they haven't heard anything in the next week.   This note has been created with Surveyor, quantity. Any transcriptional errors are unintentional.    Angelica Chessman, MD, [place], [place], [place] and [place], [place]   07/21/2015, 4:46 PM

## 2015-07-22 DIAGNOSIS — E669 Obesity, unspecified: Secondary | ICD-10-CM | POA: Diagnosis not present

## 2015-07-22 DIAGNOSIS — N261 Atrophy of kidney (terminal): Secondary | ICD-10-CM | POA: Diagnosis not present

## 2015-07-22 DIAGNOSIS — Z6836 Body mass index (BMI) 36.0-36.9, adult: Secondary | ICD-10-CM | POA: Diagnosis not present

## 2015-07-22 DIAGNOSIS — T8579XD Infection and inflammatory reaction due to other internal prosthetic devices, implants and grafts, subsequent encounter: Secondary | ICD-10-CM | POA: Diagnosis not present

## 2015-07-22 DIAGNOSIS — E1165 Type 2 diabetes mellitus with hyperglycemia: Secondary | ICD-10-CM | POA: Diagnosis not present

## 2015-07-22 DIAGNOSIS — T8189XD Other complications of procedures, not elsewhere classified, subsequent encounter: Secondary | ICD-10-CM | POA: Diagnosis not present

## 2015-07-22 LAB — MICROALBUMIN / CREATININE URINE RATIO
CREATININE, URINE: 126 mg/dL
MICROALB/CREAT RATIO: 4.8 mg/g (ref 0.0–30.0)
Microalb, Ur: 0.6 mg/dL (ref <2.0)

## 2015-07-25 DIAGNOSIS — T8189XD Other complications of procedures, not elsewhere classified, subsequent encounter: Secondary | ICD-10-CM | POA: Diagnosis not present

## 2015-07-25 DIAGNOSIS — E669 Obesity, unspecified: Secondary | ICD-10-CM | POA: Diagnosis not present

## 2015-07-25 DIAGNOSIS — T8579XD Infection and inflammatory reaction due to other internal prosthetic devices, implants and grafts, subsequent encounter: Secondary | ICD-10-CM | POA: Diagnosis not present

## 2015-07-25 DIAGNOSIS — E1165 Type 2 diabetes mellitus with hyperglycemia: Secondary | ICD-10-CM | POA: Diagnosis not present

## 2015-07-25 DIAGNOSIS — Z6836 Body mass index (BMI) 36.0-36.9, adult: Secondary | ICD-10-CM | POA: Diagnosis not present

## 2015-07-25 DIAGNOSIS — N261 Atrophy of kidney (terminal): Secondary | ICD-10-CM | POA: Diagnosis not present

## 2015-07-27 DIAGNOSIS — E1165 Type 2 diabetes mellitus with hyperglycemia: Secondary | ICD-10-CM | POA: Diagnosis not present

## 2015-07-27 DIAGNOSIS — T8189XD Other complications of procedures, not elsewhere classified, subsequent encounter: Secondary | ICD-10-CM | POA: Diagnosis not present

## 2015-07-27 DIAGNOSIS — T8579XD Infection and inflammatory reaction due to other internal prosthetic devices, implants and grafts, subsequent encounter: Secondary | ICD-10-CM | POA: Diagnosis not present

## 2015-07-27 DIAGNOSIS — E669 Obesity, unspecified: Secondary | ICD-10-CM | POA: Diagnosis not present

## 2015-07-27 DIAGNOSIS — Z6836 Body mass index (BMI) 36.0-36.9, adult: Secondary | ICD-10-CM | POA: Diagnosis not present

## 2015-07-27 DIAGNOSIS — N261 Atrophy of kidney (terminal): Secondary | ICD-10-CM | POA: Diagnosis not present

## 2015-07-29 ENCOUNTER — Telehealth: Payer: Self-pay

## 2015-07-29 DIAGNOSIS — E669 Obesity, unspecified: Secondary | ICD-10-CM | POA: Diagnosis not present

## 2015-07-29 DIAGNOSIS — N261 Atrophy of kidney (terminal): Secondary | ICD-10-CM | POA: Diagnosis not present

## 2015-07-29 DIAGNOSIS — T8579XD Infection and inflammatory reaction due to other internal prosthetic devices, implants and grafts, subsequent encounter: Secondary | ICD-10-CM | POA: Diagnosis not present

## 2015-07-29 DIAGNOSIS — E1165 Type 2 diabetes mellitus with hyperglycemia: Secondary | ICD-10-CM | POA: Diagnosis not present

## 2015-07-29 DIAGNOSIS — T8189XD Other complications of procedures, not elsewhere classified, subsequent encounter: Secondary | ICD-10-CM | POA: Diagnosis not present

## 2015-07-29 DIAGNOSIS — Z6836 Body mass index (BMI) 36.0-36.9, adult: Secondary | ICD-10-CM | POA: Diagnosis not present

## 2015-07-29 NOTE — Telephone Encounter (Signed)
Spoke with patient this am Patient states she has home health come out to the house Three days a week for her wound care Home health is asking for a prescription for abd belt Spoke with her provider and he does not feel this would benefit patient With having a wound may tend tend to aggravate the area  Not reccomended at this time

## 2015-08-01 DIAGNOSIS — T8189XD Other complications of procedures, not elsewhere classified, subsequent encounter: Secondary | ICD-10-CM | POA: Diagnosis not present

## 2015-08-01 DIAGNOSIS — E669 Obesity, unspecified: Secondary | ICD-10-CM | POA: Diagnosis not present

## 2015-08-01 DIAGNOSIS — N261 Atrophy of kidney (terminal): Secondary | ICD-10-CM | POA: Diagnosis not present

## 2015-08-01 DIAGNOSIS — T8579XD Infection and inflammatory reaction due to other internal prosthetic devices, implants and grafts, subsequent encounter: Secondary | ICD-10-CM | POA: Diagnosis not present

## 2015-08-01 DIAGNOSIS — Z6836 Body mass index (BMI) 36.0-36.9, adult: Secondary | ICD-10-CM | POA: Diagnosis not present

## 2015-08-01 DIAGNOSIS — E1165 Type 2 diabetes mellitus with hyperglycemia: Secondary | ICD-10-CM | POA: Diagnosis not present

## 2015-08-03 DIAGNOSIS — T8579XD Infection and inflammatory reaction due to other internal prosthetic devices, implants and grafts, subsequent encounter: Secondary | ICD-10-CM | POA: Diagnosis not present

## 2015-08-03 DIAGNOSIS — E1165 Type 2 diabetes mellitus with hyperglycemia: Secondary | ICD-10-CM | POA: Diagnosis not present

## 2015-08-03 DIAGNOSIS — T8189XD Other complications of procedures, not elsewhere classified, subsequent encounter: Secondary | ICD-10-CM | POA: Diagnosis not present

## 2015-08-03 DIAGNOSIS — Z6836 Body mass index (BMI) 36.0-36.9, adult: Secondary | ICD-10-CM | POA: Diagnosis not present

## 2015-08-03 DIAGNOSIS — N261 Atrophy of kidney (terminal): Secondary | ICD-10-CM | POA: Diagnosis not present

## 2015-08-03 DIAGNOSIS — E669 Obesity, unspecified: Secondary | ICD-10-CM | POA: Diagnosis not present

## 2015-08-04 ENCOUNTER — Other Ambulatory Visit: Payer: Self-pay | Admitting: Internal Medicine

## 2015-08-04 MED ORDER — MENTHOL-ZINC OXIDE 0.44-20.625 % EX OINT: TOPICAL_OINTMENT | CUTANEOUS | Status: DC

## 2015-08-05 ENCOUNTER — Telehealth: Payer: Self-pay

## 2015-08-05 DIAGNOSIS — E1165 Type 2 diabetes mellitus with hyperglycemia: Secondary | ICD-10-CM | POA: Diagnosis not present

## 2015-08-05 DIAGNOSIS — T8189XD Other complications of procedures, not elsewhere classified, subsequent encounter: Secondary | ICD-10-CM | POA: Diagnosis not present

## 2015-08-05 DIAGNOSIS — E669 Obesity, unspecified: Secondary | ICD-10-CM | POA: Diagnosis not present

## 2015-08-05 DIAGNOSIS — T8579XD Infection and inflammatory reaction due to other internal prosthetic devices, implants and grafts, subsequent encounter: Secondary | ICD-10-CM | POA: Diagnosis not present

## 2015-08-05 DIAGNOSIS — N261 Atrophy of kidney (terminal): Secondary | ICD-10-CM | POA: Diagnosis not present

## 2015-08-05 DIAGNOSIS — Z6836 Body mass index (BMI) 36.0-36.9, adult: Secondary | ICD-10-CM | POA: Diagnosis not present

## 2015-08-05 NOTE — Telephone Encounter (Signed)
CMA called Pt, pt verified name and DOB. Spoke to pt to let her know that her Calmoseptine ointment is available for pickup here at [facility]. Pt had no further questions, and verbalized understanding.

## 2015-08-05 NOTE — Telephone Encounter (Signed)
CMA called Pt, pt verified name and DOB. Spoke to pt to let her know that her Calmoseptine ointment is available for pickup here at [facility]. Pt had no further questions, and verbalized understanding.

## 2015-08-05 NOTE — Telephone Encounter (Signed)
-----   Message from Tresa Garter, MD sent at 08/04/2015  5:30 PM EDT ----- Regarding: RE: Wound care ointment Contact: [phone] Please inform patient that Calmoseptine has been prescribed to her pharmacy [facility]) for pick up. Thanks ----- Message -----    From: [employer], CMA    Sent: 08/03/2015   4:36 PM      To: Trecia Rogers, CMA, Tresa Garter, MD Subject: Wound care ointment                            Pt called and left a message stating that her [place] Nurse stated that she needs rx of Calmoseptine for her wound care. Message was left on 07/28/15.

## 2015-08-08 DIAGNOSIS — N261 Atrophy of kidney (terminal): Secondary | ICD-10-CM | POA: Diagnosis not present

## 2015-08-08 DIAGNOSIS — E669 Obesity, unspecified: Secondary | ICD-10-CM | POA: Diagnosis not present

## 2015-08-08 DIAGNOSIS — Z6836 Body mass index (BMI) 36.0-36.9, adult: Secondary | ICD-10-CM | POA: Diagnosis not present

## 2015-08-08 DIAGNOSIS — T8189XD Other complications of procedures, not elsewhere classified, subsequent encounter: Secondary | ICD-10-CM | POA: Diagnosis not present

## 2015-08-08 DIAGNOSIS — E1165 Type 2 diabetes mellitus with hyperglycemia: Secondary | ICD-10-CM | POA: Diagnosis not present

## 2015-08-08 DIAGNOSIS — T8579XD Infection and inflammatory reaction due to other internal prosthetic devices, implants and grafts, subsequent encounter: Secondary | ICD-10-CM | POA: Diagnosis not present

## 2015-08-10 DIAGNOSIS — Z6836 Body mass index (BMI) 36.0-36.9, adult: Secondary | ICD-10-CM | POA: Diagnosis not present

## 2015-08-10 DIAGNOSIS — T8579XD Infection and inflammatory reaction due to other internal prosthetic devices, implants and grafts, subsequent encounter: Secondary | ICD-10-CM | POA: Diagnosis not present

## 2015-08-10 DIAGNOSIS — N261 Atrophy of kidney (terminal): Secondary | ICD-10-CM | POA: Diagnosis not present

## 2015-08-10 DIAGNOSIS — T8189XD Other complications of procedures, not elsewhere classified, subsequent encounter: Secondary | ICD-10-CM | POA: Diagnosis not present

## 2015-08-10 DIAGNOSIS — E1165 Type 2 diabetes mellitus with hyperglycemia: Secondary | ICD-10-CM | POA: Diagnosis not present

## 2015-08-10 DIAGNOSIS — E669 Obesity, unspecified: Secondary | ICD-10-CM | POA: Diagnosis not present

## 2015-08-12 DIAGNOSIS — Z6836 Body mass index (BMI) 36.0-36.9, adult: Secondary | ICD-10-CM | POA: Diagnosis not present

## 2015-08-12 DIAGNOSIS — E669 Obesity, unspecified: Secondary | ICD-10-CM | POA: Diagnosis not present

## 2015-08-12 DIAGNOSIS — T8579XD Infection and inflammatory reaction due to other internal prosthetic devices, implants and grafts, subsequent encounter: Secondary | ICD-10-CM | POA: Diagnosis not present

## 2015-08-12 DIAGNOSIS — N261 Atrophy of kidney (terminal): Secondary | ICD-10-CM | POA: Diagnosis not present

## 2015-08-12 DIAGNOSIS — E1165 Type 2 diabetes mellitus with hyperglycemia: Secondary | ICD-10-CM | POA: Diagnosis not present

## 2015-08-12 DIAGNOSIS — T8189XD Other complications of procedures, not elsewhere classified, subsequent encounter: Secondary | ICD-10-CM | POA: Diagnosis not present

## 2015-08-15 DIAGNOSIS — Z6836 Body mass index (BMI) 36.0-36.9, adult: Secondary | ICD-10-CM | POA: Diagnosis not present

## 2015-08-15 DIAGNOSIS — E669 Obesity, unspecified: Secondary | ICD-10-CM | POA: Diagnosis not present

## 2015-08-15 DIAGNOSIS — T8189XD Other complications of procedures, not elsewhere classified, subsequent encounter: Secondary | ICD-10-CM | POA: Diagnosis not present

## 2015-08-15 DIAGNOSIS — E1165 Type 2 diabetes mellitus with hyperglycemia: Secondary | ICD-10-CM | POA: Diagnosis not present

## 2015-08-15 DIAGNOSIS — T8579XD Infection and inflammatory reaction due to other internal prosthetic devices, implants and grafts, subsequent encounter: Secondary | ICD-10-CM | POA: Diagnosis not present

## 2015-08-15 DIAGNOSIS — N261 Atrophy of kidney (terminal): Secondary | ICD-10-CM | POA: Diagnosis not present

## 2015-08-17 DIAGNOSIS — T8579XD Infection and inflammatory reaction due to other internal prosthetic devices, implants and grafts, subsequent encounter: Secondary | ICD-10-CM | POA: Diagnosis not present

## 2015-08-17 DIAGNOSIS — Z6836 Body mass index (BMI) 36.0-36.9, adult: Secondary | ICD-10-CM | POA: Diagnosis not present

## 2015-08-17 DIAGNOSIS — E1165 Type 2 diabetes mellitus with hyperglycemia: Secondary | ICD-10-CM | POA: Diagnosis not present

## 2015-08-17 DIAGNOSIS — T8189XD Other complications of procedures, not elsewhere classified, subsequent encounter: Secondary | ICD-10-CM | POA: Diagnosis not present

## 2015-08-17 DIAGNOSIS — N261 Atrophy of kidney (terminal): Secondary | ICD-10-CM | POA: Diagnosis not present

## 2015-08-17 DIAGNOSIS — E669 Obesity, unspecified: Secondary | ICD-10-CM | POA: Diagnosis not present

## 2015-08-18 ENCOUNTER — Ambulatory Visit: Payer: Medicare Other | Admitting: Internal Medicine

## 2015-08-18 ENCOUNTER — Encounter: Payer: Medicare Other | Admitting: Pharmacist

## 2015-08-18 ENCOUNTER — Telehealth: Payer: Self-pay | Admitting: *Deleted

## 2015-08-18 ENCOUNTER — Ambulatory Visit: Payer: Medicare Other | Attending: Internal Medicine | Admitting: Internal Medicine

## 2015-08-18 ENCOUNTER — Encounter: Payer: Self-pay | Admitting: Internal Medicine

## 2015-08-18 VITALS — BP 141/68 | HR 72 | Temp 97.8°F | Resp 18 | Ht 67.0 in | Wt 236.4 lb

## 2015-08-18 DIAGNOSIS — Z79899 Other long term (current) drug therapy: Secondary | ICD-10-CM | POA: Insufficient documentation

## 2015-08-18 DIAGNOSIS — Z23 Encounter for immunization: Secondary | ICD-10-CM

## 2015-08-18 DIAGNOSIS — F329 Major depressive disorder, single episode, unspecified: Secondary | ICD-10-CM | POA: Insufficient documentation

## 2015-08-18 DIAGNOSIS — E785 Hyperlipidemia, unspecified: Secondary | ICD-10-CM

## 2015-08-18 DIAGNOSIS — Z794 Long term (current) use of insulin: Secondary | ICD-10-CM

## 2015-08-18 DIAGNOSIS — E118 Type 2 diabetes mellitus with unspecified complications: Secondary | ICD-10-CM | POA: Diagnosis not present

## 2015-08-18 DIAGNOSIS — F32A Depression, unspecified: Secondary | ICD-10-CM

## 2015-08-18 DIAGNOSIS — I10 Essential (primary) hypertension: Secondary | ICD-10-CM

## 2015-08-18 LAB — GLUCOSE, POCT (MANUAL RESULT ENTRY): POC Glucose: 207 mg/dl — AB (ref 70–99)

## 2015-08-18 LAB — POCT GLYCOSYLATED HEMOGLOBIN (HGB A1C): HEMOGLOBIN A1C: 9.5

## 2015-08-18 NOTE — Progress Notes (Signed)
Patient here for 4wk DM F/U.  Patient denies pain at this time.  Patient has eaten and taken medications this morning.

## 2015-08-18 NOTE — Progress Notes (Signed)
Subjective:    Patient ID: Catherine Fowler, female    DOB: Mar 07, 1951, 64 y.o.   MRN: [id number]  HPI Catherine Fowler is 64 year old female here for follow up for DMT2, Patient has significant history of Essential HTN, Colon Cancer s/p surgery 2014, DMT2, Dyslipidemia, and depression. Patient has cellulitis of an open abdominal incision, which requires addition surgery. Patient states surgeon will not operate until her A1C is less than 8%. Patient has home health nurse 3 times per week, that assists her with wound care, diet and activities of daily living. Patient brought to the clinic her blood sugar log for review. Fasting blood sugar ranges between 128 and 215. Patient states the 215 was during two day period when she was without insulin. Patient is compliant with medications and blood sugar monitoring. Patient has no headache, no chest pain, no nausea, no new weakness, tingling or numbness, no cough or SOB  Review of Systems  Constitutional: Negative.   HENT: Negative.   Eyes: Negative.   Respiratory: Negative.   Cardiovascular: Negative.   Gastrointestinal: Positive for abdominal pain.  Endocrine: Negative.   Genitourinary: Negative.   Musculoskeletal: Negative.   Allergic/Immunologic: Negative.   Neurological: Negative.   Hematological: Negative.   Psychiatric/Behavioral:       Patient states she remains depressed   Visit Vital Signs BP 141/68 mmHg  Pulse 72  Temp(Src) 97.8 F (36.6 C) (Oral)  Resp 18  Ht '5\' 7"'  (1.702 m)  Wt 236 lb 6.4 oz (107.23 kg)  BMI 37.02 kg/m2  SpO2 99%     Objective:   Physical Exam  Constitutional: She is oriented to person, place, and time. She appears well-developed and well-nourished.  HENT:  Head: Normocephalic and atraumatic.  Eyes: Pupils are equal, round, and reactive to light.  Neck: Normal range of motion. Neck supple.  Pulmonary/Chest: Effort normal and breath sounds normal.  Abdominal: Soft. Bowel sounds are normal. There is  tenderness.  Musculoskeletal: Normal range of motion.  Neurological: She is alert and oriented to person, place, and time. She has normal reflexes.  Skin: Skin is warm and dry.  Psychiatric: She has a normal mood and affect. Her behavior is normal. Judgment and thought content normal.   Current Outpatient Prescriptions on File Prior to Visit  Medication Sig Dispense Refill  . fenofibrate (TRICOR) 145 MG tablet Take 1 tablet (145 mg total) by mouth daily. 90 tablet 3  . glipiZIDE (GLUCOTROL) 10 MG tablet Take 1 tablet (10 mg total) by mouth 2 (two) times daily before a meal. 180 tablet 3  . glucose monitoring kit (FREESTYLE) monitoring kit 1 each by Does not apply route 4 (four) times daily - after meals and at bedtime. 1 month Diabetic Testing Supplies for QAC-QHS accuchecks. Any brand okay 1 each 1  . Insulin Glargine (LANTUS SOLOSTAR) 100 UNIT/ML Solostar Pen Inject 80 Units into the skin at bedtime. 5 pen 3  . Menthol-Zinc Oxide (CALMOSEPTINE) 0.44-20.625 % OINT Apply to wound 3 times daily (Patient not taking: Reported on 08/18/2015) 2 Tube 3  . nystatin cream (MYCOSTATIN) Apply 1 application topically 2 (two) times daily. (Patient not taking: Reported on 08/18/2015) 30 g 3   No current facility-administered medications on file prior to visit.         Assessment & Plan:  Catherine Fowler was seen today for follow-up.  Diagnoses and all orders for this visit:  Type 2 diabetes mellitus with complication, with long-term current use of insulin ([place]) -  Glucose (CBG) -     POCT A1C -     Flu Vaccine QUAD 36+ mos PF IM (Fluarix & Fluzone Quad PF)       -     Patient encouraged to continue her current medications        -     Glipizide 10 mg BID before meals       -     Lantus 80 units at bedtime        -     Diet counseling included the following Aim for 2-3 Carb Choices per meal (30-45 grams) +/- 1 either way  Aim for 0-15 Carbs per snack if hungry  Include protein in moderation with your  meals and snacks  Consider reading food labels for Total Carbohydrate and Fat Grams of foods  Consider checking BG at alternate times per day  Continue taking medication as directed Fruit Punch - find one with no sugar  Measure and decrease portions of carbohydrate foods  Make your plate and don't go back for seconds  Depression (emotion)      -     Counseling and medication offered, patient refuses at this time. Patient encouraged to talk with family or friends or to come back to the clinic if her depression worsens.   Other orders -     Pneumococcal conjugate vaccine 13-valent -     Patient requests referral to [facility] for Abdominal Support Band. Request sent.       Patient have been counseled extensively about nutrition and exercise  Return in about 8 weeks (around 10/18/2015) for Hemoglobin A1C and Follow up, DM, Follow up HTN, CBG, Lab/Nurse Visit.  The patient was given clear instructions to go to ER or return to medical center if symptoms don't improve, worsen or new problems develop. The patient verbalized understanding.   Calton Dach, BSN, [place] and [place] 08/18/2015 1:24 PM  Evaluation and management procedures were performed by the Advanced Practitioner under my supervision and collaboration. I have reviewed the Advanced Practitioner's note and chart, and I agree with the management and plan.  Angelica Chessman, MD, [place], [place], [place], [place] and [facility] [place], [place]

## 2015-08-18 NOTE — Telephone Encounter (Signed)
Medical Assistant spoke with patients Home Health Nurse Levester Fresh. Medical Assistant gave Nurse verbal order per Dr. Doreene Burke to approve patient of receiving an abdominal band. Nurse had no further questions and requesting no additional information.

## 2015-08-18 NOTE — Patient Instructions (Addendum)
Food Choices to Lower Your Triglycerides Triglycerides are a type of fat in your blood. High levels of triglycerides can increase the risk of heart disease and stroke. If your triglyceride levels are high, the foods you eat and your eating habits are very important. Choosing the right foods can help lower your triglycerides.  WHAT GENERAL GUIDELINES DO I NEED TO FOLLOW?  Lose weight if you are overweight.   Limit or avoid alcohol.   Fill one half of your plate with vegetables and green salads.   Limit fruit to two servings a day. Choose fruit instead of juice.   Make one fourth of your plate whole grains. Look for the word "whole" as the first word in the ingredient list.  Fill one fourth of your plate with lean protein foods.  Enjoy fatty fish (such as salmon, mackerel, sardines, and tuna) three times a week.   Choose healthy fats.   Limit foods high in starch and sugar.  Eat more home-cooked food and less restaurant, buffet, and fast food.  Limit fried foods.  Cook foods using methods other than frying.  Limit saturated fats.  Check ingredient lists to avoid foods with partially hydrogenated oils (trans fats) in them. WHAT FOODS CAN I EAT?  Grains Whole grains, such as whole wheat or whole grain breads, crackers, cereals, and pasta. Unsweetened oatmeal, bulgur, barley, quinoa, or brown rice. Corn or whole wheat flour tortillas.  Vegetables Fresh or frozen vegetables (raw, steamed, roasted, or grilled). Green salads. Fruits All fresh, canned (in natural juice), or frozen fruits. Meat and Other Protein Products Ground beef (85% or leaner), grass-fed beef, or beef trimmed of fat. Skinless chicken or turkey. Ground chicken or turkey. Pork trimmed of fat. All fish and seafood. Eggs. Dried beans, peas, or lentils. Unsalted nuts or seeds. Unsalted canned or dry beans. Dairy Low-fat dairy products, such as skim or 1% milk, 2% or reduced-fat cheeses, low-fat ricotta or cottage  cheese, or plain low-fat yogurt. Fats and Oils Tub margarines without trans fats. Light or reduced-fat mayonnaise and salad dressings. Avocado. Safflower, olive, or canola oils. Natural peanut or almond butter. The items listed above may not be a complete list of recommended foods or beverages. Contact your dietitian for more options. WHAT FOODS ARE NOT RECOMMENDED?  Grains White bread. White pasta. White rice. Cornbread. Bagels, pastries, and croissants. Crackers that contain trans fat. Vegetables White potatoes. Corn. Creamed or fried vegetables. Vegetables in a cheese sauce. Fruits Dried fruits. Canned fruit in light or heavy syrup. Fruit juice. Meat and Other Protein Products Fatty cuts of meat. Ribs, chicken wings, bacon, sausage, bologna, salami, chitterlings, fatback, hot dogs, bratwurst, and packaged luncheon meats. Dairy Whole or 2% milk, cream, half-and-half, and cream cheese. Whole-fat or sweetened yogurt. Full-fat cheeses. Nondairy creamers and whipped toppings. Processed cheese, cheese spreads, or cheese curds. Sweets and Desserts Corn syrup, sugars, honey, and molasses. Candy. Jam and jelly. Syrup. Sweetened cereals. Cookies, pies, cakes, donuts, muffins, and ice cream. Fats and Oils Butter, stick margarine, lard, shortening, ghee, or bacon fat. Coconut, palm kernel, or palm oils. Beverages Alcohol. Sweetened drinks (such as sodas, lemonade, and fruit drinks or punches). The items listed above may not be a complete list of foods and beverages to avoid. Contact your dietitian for more information.   This information is not intended to replace advice given to you by your health care provider. Make sure you discuss any questions you have with your health care provider.   Document Released: 08/16/2004   Document Revised: 11/19/2014 Document Reviewed: 09/02/2013 Elsevier Interactive Patient Education 2016 [employer]. Diabetes and Exercise Exercising regularly is important. It is  not just about losing weight. It has many health benefits, such as:  Improving your overall fitness, flexibility, and endurance.  Increasing your bone density.  Helping with weight control.  Decreasing your body fat.  Increasing your muscle strength.  Reducing stress and tension.  Improving your overall health. People with diabetes who exercise gain additional benefits because exercise:  Reduces appetite.  Improves the body's use of blood sugar (glucose).  Helps lower or control blood glucose.  Decreases blood pressure.  Helps control blood lipids (such as cholesterol and triglycerides).  Improves the body's use of the hormone insulin by:  Increasing the body's insulin sensitivity.  Reducing the body's insulin needs.  Decreases the risk for heart disease because exercising:  Lowers cholesterol and triglycerides levels.  Increases the levels of good cholesterol (such as high-density lipoproteins [HDL]) in the body.  Lowers blood glucose levels. YOUR ACTIVITY PLAN  Choose an activity that you enjoy, and set realistic goals. To exercise safely, you should begin practicing any new physical activity slowly, and gradually increase the intensity of the exercise over time. Your health care provider or diabetes educator can help create an activity plan that works for you. General recommendations include:  Encouraging children to engage in at least 60 minutes of physical activity each day.  Stretching and performing strength training exercises, such as yoga or weight lifting, at least 2 times per week.  Performing a total of at least 150 minutes of moderate-intensity exercise each week, such as brisk walking or water aerobics.  Exercising at least 3 days per week, making sure you allow no more than 2 consecutive days to pass without exercising.  Avoiding long periods of inactivity (90 minutes or more). When you have to spend an extended period of time sitting down, take  frequent breaks to walk or stretch. RECOMMENDATIONS FOR EXERCISING WITH TYPE 1 OR TYPE 2 DIABETES   Check your blood glucose before exercising. If blood glucose levels are greater than 240 mg/dL, check for urine ketones. Do not exercise if ketones are present.  Avoid injecting insulin into areas of the body that are going to be exercised. For example, avoid injecting insulin into:  The arms when playing tennis.  The legs when jogging.  Keep a record of:  Food intake before and after you exercise.  Expected peak times of insulin action.  Blood glucose levels before and after you exercise.  The type and amount of exercise you have done.  Review your records with your health care provider. Your health care provider will help you to develop guidelines for adjusting food intake and insulin amounts before and after exercising.  If you take insulin or oral hypoglycemic agents, watch for signs and symptoms of hypoglycemia. They include:  Dizziness.  Shaking.  Sweating.  Chills.  Confusion.  Drink plenty of water while you exercise to prevent dehydration or heat stroke. Body water is lost during exercise and must be replaced.  Talk to your health care provider before starting an exercise program to make sure it is safe for you. Remember, almost any type of activity is better than none.   This information is not intended to replace advice given to you by your health care provider. Make sure you discuss any questions you have with your health care provider.   Document Released: 01/19/2004 Document Revised: 03/15/2015 Document Reviewed: 04/07/2013 Elsevier  Interactive Patient Education 2016 Elsevier Inc.  

## 2015-08-19 DIAGNOSIS — E669 Obesity, unspecified: Secondary | ICD-10-CM | POA: Diagnosis not present

## 2015-08-19 DIAGNOSIS — Z6836 Body mass index (BMI) 36.0-36.9, adult: Secondary | ICD-10-CM | POA: Diagnosis not present

## 2015-08-19 DIAGNOSIS — T8189XD Other complications of procedures, not elsewhere classified, subsequent encounter: Secondary | ICD-10-CM | POA: Diagnosis not present

## 2015-08-19 DIAGNOSIS — T8579XD Infection and inflammatory reaction due to other internal prosthetic devices, implants and grafts, subsequent encounter: Secondary | ICD-10-CM | POA: Diagnosis not present

## 2015-08-19 DIAGNOSIS — E1165 Type 2 diabetes mellitus with hyperglycemia: Secondary | ICD-10-CM | POA: Diagnosis not present

## 2015-08-19 DIAGNOSIS — N261 Atrophy of kidney (terminal): Secondary | ICD-10-CM | POA: Diagnosis not present

## 2015-08-22 DIAGNOSIS — E669 Obesity, unspecified: Secondary | ICD-10-CM | POA: Diagnosis not present

## 2015-08-22 DIAGNOSIS — Z6836 Body mass index (BMI) 36.0-36.9, adult: Secondary | ICD-10-CM | POA: Diagnosis not present

## 2015-08-22 DIAGNOSIS — T8189XD Other complications of procedures, not elsewhere classified, subsequent encounter: Secondary | ICD-10-CM | POA: Diagnosis not present

## 2015-08-22 DIAGNOSIS — E1165 Type 2 diabetes mellitus with hyperglycemia: Secondary | ICD-10-CM | POA: Diagnosis not present

## 2015-08-22 DIAGNOSIS — N261 Atrophy of kidney (terminal): Secondary | ICD-10-CM | POA: Diagnosis not present

## 2015-08-22 DIAGNOSIS — T8579XD Infection and inflammatory reaction due to other internal prosthetic devices, implants and grafts, subsequent encounter: Secondary | ICD-10-CM | POA: Diagnosis not present

## 2015-08-24 DIAGNOSIS — T8579XD Infection and inflammatory reaction due to other internal prosthetic devices, implants and grafts, subsequent encounter: Secondary | ICD-10-CM | POA: Diagnosis not present

## 2015-08-24 DIAGNOSIS — N261 Atrophy of kidney (terminal): Secondary | ICD-10-CM | POA: Diagnosis not present

## 2015-08-24 DIAGNOSIS — T8189XD Other complications of procedures, not elsewhere classified, subsequent encounter: Secondary | ICD-10-CM | POA: Diagnosis not present

## 2015-08-24 DIAGNOSIS — Z6836 Body mass index (BMI) 36.0-36.9, adult: Secondary | ICD-10-CM | POA: Diagnosis not present

## 2015-08-24 DIAGNOSIS — E669 Obesity, unspecified: Secondary | ICD-10-CM | POA: Diagnosis not present

## 2015-08-24 DIAGNOSIS — E1165 Type 2 diabetes mellitus with hyperglycemia: Secondary | ICD-10-CM | POA: Diagnosis not present

## 2015-08-25 DIAGNOSIS — Z6836 Body mass index (BMI) 36.0-36.9, adult: Secondary | ICD-10-CM | POA: Diagnosis not present

## 2015-08-25 DIAGNOSIS — E1165 Type 2 diabetes mellitus with hyperglycemia: Secondary | ICD-10-CM | POA: Diagnosis not present

## 2015-08-25 DIAGNOSIS — N261 Atrophy of kidney (terminal): Secondary | ICD-10-CM | POA: Diagnosis not present

## 2015-08-25 DIAGNOSIS — E669 Obesity, unspecified: Secondary | ICD-10-CM | POA: Diagnosis not present

## 2015-08-25 DIAGNOSIS — T8189XD Other complications of procedures, not elsewhere classified, subsequent encounter: Secondary | ICD-10-CM | POA: Diagnosis not present

## 2015-08-25 DIAGNOSIS — T8579XD Infection and inflammatory reaction due to other internal prosthetic devices, implants and grafts, subsequent encounter: Secondary | ICD-10-CM | POA: Diagnosis not present

## 2015-08-26 DIAGNOSIS — E669 Obesity, unspecified: Secondary | ICD-10-CM | POA: Diagnosis not present

## 2015-08-26 DIAGNOSIS — T8189XD Other complications of procedures, not elsewhere classified, subsequent encounter: Secondary | ICD-10-CM | POA: Diagnosis not present

## 2015-08-26 DIAGNOSIS — E1165 Type 2 diabetes mellitus with hyperglycemia: Secondary | ICD-10-CM | POA: Diagnosis not present

## 2015-08-26 DIAGNOSIS — N261 Atrophy of kidney (terminal): Secondary | ICD-10-CM | POA: Diagnosis not present

## 2015-08-26 DIAGNOSIS — T8579XD Infection and inflammatory reaction due to other internal prosthetic devices, implants and grafts, subsequent encounter: Secondary | ICD-10-CM | POA: Diagnosis not present

## 2015-08-26 DIAGNOSIS — Z6836 Body mass index (BMI) 36.0-36.9, adult: Secondary | ICD-10-CM | POA: Diagnosis not present

## 2015-08-29 DIAGNOSIS — E669 Obesity, unspecified: Secondary | ICD-10-CM | POA: Diagnosis not present

## 2015-08-29 DIAGNOSIS — Z6836 Body mass index (BMI) 36.0-36.9, adult: Secondary | ICD-10-CM | POA: Diagnosis not present

## 2015-08-29 DIAGNOSIS — E1165 Type 2 diabetes mellitus with hyperglycemia: Secondary | ICD-10-CM | POA: Diagnosis not present

## 2015-08-29 DIAGNOSIS — T8189XD Other complications of procedures, not elsewhere classified, subsequent encounter: Secondary | ICD-10-CM | POA: Diagnosis not present

## 2015-08-29 DIAGNOSIS — T8579XD Infection and inflammatory reaction due to other internal prosthetic devices, implants and grafts, subsequent encounter: Secondary | ICD-10-CM | POA: Diagnosis not present

## 2015-08-29 DIAGNOSIS — N261 Atrophy of kidney (terminal): Secondary | ICD-10-CM | POA: Diagnosis not present

## 2015-08-31 DIAGNOSIS — E1165 Type 2 diabetes mellitus with hyperglycemia: Secondary | ICD-10-CM | POA: Diagnosis not present

## 2015-08-31 DIAGNOSIS — T8579XD Infection and inflammatory reaction due to other internal prosthetic devices, implants and grafts, subsequent encounter: Secondary | ICD-10-CM | POA: Diagnosis not present

## 2015-08-31 DIAGNOSIS — T8189XD Other complications of procedures, not elsewhere classified, subsequent encounter: Secondary | ICD-10-CM | POA: Diagnosis not present

## 2015-08-31 DIAGNOSIS — N261 Atrophy of kidney (terminal): Secondary | ICD-10-CM | POA: Diagnosis not present

## 2015-08-31 DIAGNOSIS — E669 Obesity, unspecified: Secondary | ICD-10-CM | POA: Diagnosis not present

## 2015-08-31 DIAGNOSIS — Z6836 Body mass index (BMI) 36.0-36.9, adult: Secondary | ICD-10-CM | POA: Diagnosis not present

## 2015-09-01 DIAGNOSIS — T8579XA Infection and inflammatory reaction due to other internal prosthetic devices, implants and grafts, initial encounter: Secondary | ICD-10-CM | POA: Diagnosis not present

## 2015-09-02 DIAGNOSIS — N261 Atrophy of kidney (terminal): Secondary | ICD-10-CM | POA: Diagnosis not present

## 2015-09-02 DIAGNOSIS — T8189XD Other complications of procedures, not elsewhere classified, subsequent encounter: Secondary | ICD-10-CM | POA: Diagnosis not present

## 2015-09-02 DIAGNOSIS — E1165 Type 2 diabetes mellitus with hyperglycemia: Secondary | ICD-10-CM | POA: Diagnosis not present

## 2015-09-02 DIAGNOSIS — Z6836 Body mass index (BMI) 36.0-36.9, adult: Secondary | ICD-10-CM | POA: Diagnosis not present

## 2015-09-02 DIAGNOSIS — T8579XD Infection and inflammatory reaction due to other internal prosthetic devices, implants and grafts, subsequent encounter: Secondary | ICD-10-CM | POA: Diagnosis not present

## 2015-09-02 DIAGNOSIS — E669 Obesity, unspecified: Secondary | ICD-10-CM | POA: Diagnosis not present

## 2015-09-05 ENCOUNTER — Encounter: Payer: Self-pay | Admitting: Internal Medicine

## 2015-09-05 DIAGNOSIS — N261 Atrophy of kidney (terminal): Secondary | ICD-10-CM | POA: Diagnosis not present

## 2015-09-05 DIAGNOSIS — E1165 Type 2 diabetes mellitus with hyperglycemia: Secondary | ICD-10-CM | POA: Diagnosis not present

## 2015-09-05 DIAGNOSIS — T8579XD Infection and inflammatory reaction due to other internal prosthetic devices, implants and grafts, subsequent encounter: Secondary | ICD-10-CM | POA: Diagnosis not present

## 2015-09-05 DIAGNOSIS — T8189XD Other complications of procedures, not elsewhere classified, subsequent encounter: Secondary | ICD-10-CM | POA: Diagnosis not present

## 2015-09-05 DIAGNOSIS — E669 Obesity, unspecified: Secondary | ICD-10-CM | POA: Diagnosis not present

## 2015-09-05 DIAGNOSIS — Z6836 Body mass index (BMI) 36.0-36.9, adult: Secondary | ICD-10-CM | POA: Diagnosis not present

## 2015-09-07 DIAGNOSIS — N261 Atrophy of kidney (terminal): Secondary | ICD-10-CM | POA: Diagnosis not present

## 2015-09-07 DIAGNOSIS — Z6836 Body mass index (BMI) 36.0-36.9, adult: Secondary | ICD-10-CM | POA: Diagnosis not present

## 2015-09-07 DIAGNOSIS — E1165 Type 2 diabetes mellitus with hyperglycemia: Secondary | ICD-10-CM | POA: Diagnosis not present

## 2015-09-07 DIAGNOSIS — E669 Obesity, unspecified: Secondary | ICD-10-CM | POA: Diagnosis not present

## 2015-09-07 DIAGNOSIS — T8189XD Other complications of procedures, not elsewhere classified, subsequent encounter: Secondary | ICD-10-CM | POA: Diagnosis not present

## 2015-09-07 DIAGNOSIS — T8579XD Infection and inflammatory reaction due to other internal prosthetic devices, implants and grafts, subsequent encounter: Secondary | ICD-10-CM | POA: Diagnosis not present

## 2015-09-09 DIAGNOSIS — Z6836 Body mass index (BMI) 36.0-36.9, adult: Secondary | ICD-10-CM | POA: Diagnosis not present

## 2015-09-09 DIAGNOSIS — E669 Obesity, unspecified: Secondary | ICD-10-CM | POA: Diagnosis not present

## 2015-09-09 DIAGNOSIS — E1165 Type 2 diabetes mellitus with hyperglycemia: Secondary | ICD-10-CM | POA: Diagnosis not present

## 2015-09-09 DIAGNOSIS — N261 Atrophy of kidney (terminal): Secondary | ICD-10-CM | POA: Diagnosis not present

## 2015-09-09 DIAGNOSIS — T8579XD Infection and inflammatory reaction due to other internal prosthetic devices, implants and grafts, subsequent encounter: Secondary | ICD-10-CM | POA: Diagnosis not present

## 2015-09-09 DIAGNOSIS — T8189XD Other complications of procedures, not elsewhere classified, subsequent encounter: Secondary | ICD-10-CM | POA: Diagnosis not present

## 2015-09-12 DIAGNOSIS — N261 Atrophy of kidney (terminal): Secondary | ICD-10-CM | POA: Diagnosis not present

## 2015-09-12 DIAGNOSIS — Z6836 Body mass index (BMI) 36.0-36.9, adult: Secondary | ICD-10-CM | POA: Diagnosis not present

## 2015-09-12 DIAGNOSIS — E1165 Type 2 diabetes mellitus with hyperglycemia: Secondary | ICD-10-CM | POA: Diagnosis not present

## 2015-09-12 DIAGNOSIS — T8579XD Infection and inflammatory reaction due to other internal prosthetic devices, implants and grafts, subsequent encounter: Secondary | ICD-10-CM | POA: Diagnosis not present

## 2015-09-12 DIAGNOSIS — E669 Obesity, unspecified: Secondary | ICD-10-CM | POA: Diagnosis not present

## 2015-09-12 DIAGNOSIS — T8189XD Other complications of procedures, not elsewhere classified, subsequent encounter: Secondary | ICD-10-CM | POA: Diagnosis not present

## 2015-09-14 DIAGNOSIS — E669 Obesity, unspecified: Secondary | ICD-10-CM | POA: Diagnosis not present

## 2015-09-14 DIAGNOSIS — N261 Atrophy of kidney (terminal): Secondary | ICD-10-CM | POA: Diagnosis not present

## 2015-09-14 DIAGNOSIS — E1165 Type 2 diabetes mellitus with hyperglycemia: Secondary | ICD-10-CM | POA: Diagnosis not present

## 2015-09-14 DIAGNOSIS — Z6836 Body mass index (BMI) 36.0-36.9, adult: Secondary | ICD-10-CM | POA: Diagnosis not present

## 2015-09-14 DIAGNOSIS — T8189XD Other complications of procedures, not elsewhere classified, subsequent encounter: Secondary | ICD-10-CM | POA: Diagnosis not present

## 2015-09-14 DIAGNOSIS — T8579XD Infection and inflammatory reaction due to other internal prosthetic devices, implants and grafts, subsequent encounter: Secondary | ICD-10-CM | POA: Diagnosis not present

## 2015-09-15 ENCOUNTER — Ambulatory Visit: Payer: Medicare Other | Attending: Internal Medicine | Admitting: Internal Medicine

## 2015-09-15 ENCOUNTER — Encounter: Payer: Self-pay | Admitting: Internal Medicine

## 2015-09-15 VITALS — BP 130/70 | HR 93 | Temp 98.1°F | Resp 20 | Ht 67.0 in | Wt 236.0 lb

## 2015-09-15 DIAGNOSIS — E119 Type 2 diabetes mellitus without complications: Secondary | ICD-10-CM | POA: Diagnosis not present

## 2015-09-15 DIAGNOSIS — Z85038 Personal history of other malignant neoplasm of large intestine: Secondary | ICD-10-CM | POA: Diagnosis not present

## 2015-09-15 DIAGNOSIS — E1122 Type 2 diabetes mellitus with diabetic chronic kidney disease: Secondary | ICD-10-CM | POA: Diagnosis not present

## 2015-09-15 DIAGNOSIS — Z794 Long term (current) use of insulin: Secondary | ICD-10-CM | POA: Insufficient documentation

## 2015-09-15 DIAGNOSIS — I129 Hypertensive chronic kidney disease with stage 1 through stage 4 chronic kidney disease, or unspecified chronic kidney disease: Secondary | ICD-10-CM | POA: Diagnosis not present

## 2015-09-15 DIAGNOSIS — E785 Hyperlipidemia, unspecified: Secondary | ICD-10-CM | POA: Insufficient documentation

## 2015-09-15 DIAGNOSIS — N189 Chronic kidney disease, unspecified: Secondary | ICD-10-CM | POA: Insufficient documentation

## 2015-09-15 LAB — GLUCOSE, POCT (MANUAL RESULT ENTRY): POC Glucose: 216 mg/dl — AB (ref 70–99)

## 2015-09-15 LAB — POCT GLYCOSYLATED HEMOGLOBIN (HGB A1C): Hemoglobin A1C: 9.3

## 2015-09-15 MED ORDER — FENOFIBRATE 145 MG PO TABS: 145.0000 mg | ORAL_TABLET | Freq: Every day | ORAL | Status: DC

## 2015-09-15 MED ORDER — GLIPIZIDE 10 MG PO TABS: 10.0000 mg | ORAL_TABLET | Freq: Two times a day (BID) | ORAL | Status: DC

## 2015-09-15 NOTE — Progress Notes (Signed)
Patient denies pain at this time.  Patient needs refills on Glipizide and Fenofibrate

## 2015-09-15 NOTE — Patient Instructions (Signed)
Diabetes and Exercise Exercising regularly is important. It is not just about losing weight. It has many health benefits, such as:  Improving your overall fitness, flexibility, and endurance.  Increasing your bone density.  Helping with weight control.  Decreasing your body fat.  Increasing your muscle strength.  Reducing stress and tension.  Improving your overall health. People with diabetes who exercise gain additional benefits because exercise:  Reduces appetite.  Improves the body's use of blood sugar (glucose).  Helps lower or control blood glucose.  Decreases blood pressure.  Helps control blood lipids (such as cholesterol and triglycerides).  Improves the body's use of the hormone insulin by:  Increasing the body's insulin sensitivity.  Reducing the body's insulin needs.  Decreases the risk for heart disease because exercising:  Lowers cholesterol and triglycerides levels.  Increases the levels of good cholesterol (such as high-density lipoproteins [HDL]) in the body.  Lowers blood glucose levels. YOUR ACTIVITY PLAN  Choose an activity that you enjoy, and set realistic goals. To exercise safely, you should begin practicing any new physical activity slowly, and gradually increase the intensity of the exercise over time. Your health care provider or diabetes educator can help create an activity plan that works for you. General recommendations include:  Encouraging children to engage in at least 60 minutes of physical activity each day.  Stretching and performing strength training exercises, such as yoga or weight lifting, at least 2 times per week.  Performing a total of at least 150 minutes of moderate-intensity exercise each week, such as brisk walking or water aerobics.  Exercising at least 3 days per week, making sure you allow no more than 2 consecutive days to pass without exercising.  Avoiding long periods of inactivity (90 minutes or more). When you  have to spend an extended period of time sitting down, take frequent breaks to walk or stretch. RECOMMENDATIONS FOR EXERCISING WITH TYPE 1 OR TYPE 2 DIABETES   Check your blood glucose before exercising. If blood glucose levels are greater than 240 mg/dL, check for urine ketones. Do not exercise if ketones are present.  Avoid injecting insulin into areas of the body that are going to be exercised. For example, avoid injecting insulin into:  The arms when playing tennis.  The legs when jogging.  Keep a record of:  Food intake before and after you exercise.  Expected peak times of insulin action.  Blood glucose levels before and after you exercise.  The type and amount of exercise you have done.  Review your records with your health care provider. Your health care provider will help you to develop guidelines for adjusting food intake and insulin amounts before and after exercising.  If you take insulin or oral hypoglycemic agents, watch for signs and symptoms of hypoglycemia. They include:  Dizziness.  Shaking.  Sweating.  Chills.  Confusion.  Drink plenty of water while you exercise to prevent dehydration or heat stroke. Body water is lost during exercise and must be replaced.  Talk to your health care provider before starting an exercise program to make sure it is safe for you. Remember, almost any type of activity is better than none.   This information is not intended to replace advice given to you by your health care provider. Make sure you discuss any questions you have with your health care provider.   Document Released: 01/19/2004 Document Revised: 03/15/2015 Document Reviewed: 04/07/2013 Elsevier Interactive Patient Education 2016 Elsevier Inc. Basic Carbohydrate Counting for Diabetes Mellitus Carbohydrate counting   is a method for keeping track of the amount of carbohydrates you eat. Eating carbohydrates naturally increases the level of sugar (glucose) in your  blood, so it is important for you to know the amount that is okay for you to have in every meal. Carbohydrate counting helps keep the level of glucose in your blood within normal limits. The amount of carbohydrates allowed is different for every person. A dietitian can help you calculate the amount that is right for you. Once you know the amount of carbohydrates you can have, you can count the carbohydrates in the foods you want to eat. Carbohydrates are found in the following foods:  Grains, such as breads and cereals.  Dried beans and soy products.  Starchy vegetables, such as potatoes, peas, and corn.  Fruit and fruit juices.  Milk and yogurt.  Sweets and snack foods, such as cake, cookies, candy, chips, soft drinks, and fruit drinks. CARBOHYDRATE COUNTING There are two ways to count the carbohydrates in your food. You can use either of the methods or a combination of both. Reading the "Nutrition Facts" on Packaged Food The "Nutrition Facts" is an area that is included on the labels of almost all packaged food and beverages in the [place]. It includes the serving size of that food or beverage and information about the nutrients in each serving of the food, including the grams (g) of carbohydrate per serving.  Decide the number of servings of this food or beverage that you will be able to eat or drink. Multiply that number of servings by the number of grams of carbohydrate that is listed on the label for that serving. The total will be the amount of carbohydrates you will be having when you eat or drink this food or beverage. Learning Standard Serving Sizes of Food When you eat food that is not packaged or does not include "Nutrition Facts" on the label, you need to measure the servings in order to count the amount of carbohydrates.A serving of most carbohydrate-rich foods contains about 15 g of carbohydrates. The following list includes serving sizes of carbohydrate-rich foods that  provide 15 g ofcarbohydrate per serving:   1 slice of bread (1 oz) or 1 six-inch tortilla.    of a hamburger bun or English muffin.  4-6 crackers.   cup unsweetened dry cereal.    cup hot cereal.   cup rice or pasta.    cup mashed potatoes or  of a large baked potato.  1 cup fresh fruit or one small piece of fruit.    cup canned or frozen fruit or fruit juice.  1 cup milk.   cup plain fat-free yogurt or yogurt sweetened with artificial sweeteners.   cup cooked dried beans or starchy vegetable, such as peas, corn, or potatoes.  Decide the number of standard-size servings that you will eat. Multiply that number of servings by 15 (the grams of carbohydrates in that serving). For example, if you eat 2 cups of strawberries, you will have eaten 2 servings and 30 g of carbohydrates (2 servings x 15 g = 30 g). For foods such as soups and casseroles, in which more than one food is mixed in, you will need to count the carbohydrates in each food that is included. EXAMPLE OF CARBOHYDRATE COUNTING Sample Dinner  3 oz chicken breast.   cup of brown rice.   cup of corn.  1 cup milk.   1 cup strawberries with sugar-free whipped topping.  Carbohydrate Calculation Step   1: Identify the foods that contain carbohydrates:   Rice.   Corn.   Milk.   Strawberries. Step 2:Calculate the number of servings eaten of each:   2 servings of rice.   1 serving of corn.   1 serving of milk.   1 serving of strawberries. Step 3: Multiply each of those number of servings by 15 g:   2 servings of rice x 15 g = 30 g.   1 serving of corn x 15 g = 15 g.   1 serving of milk x 15 g = 15 g.   1 serving of strawberries x 15 g = 15 g. Step 4: Add together all of the amounts to find the total grams of carbohydrates eaten: 30 g + 15 g + 15 g + 15 g = 75 g.   This information is not intended to replace advice given to you by your health care provider. Make sure you  discuss any questions you have with your health care provider.   Document Released: 10/29/2005 Document Revised: 11/19/2014 Document Reviewed: 09/25/2013 Elsevier Interactive Patient Education 2016 Elsevier Inc.  

## 2015-09-15 NOTE — Progress Notes (Signed)
Patient ID: Catherine Fowler, female   DOB: January 08, 1951, 64 y.o.   MRN: [id number]   Adonna Horsley, is a 65 y.o. female  [id number]:[id number]  [id number]  DOB - [birth year]  Chief Complaint  Patient presents with  . Follow-up        Subjective:   Catherine Fowler is a 64 y.o. female here today for a follow up visit.patient has history of hypertension, dyslipidemia, major depression and type 2 diabetes mellitus uncontrolled despite 80 units of Lantus insulin daily at bedtime and glipizide 10 mg tablet by mouth twice a day. Patient had colon cancer status post surgery in 2014 and has since then had postoperative wound dehiscence and nonhealing anterior abdominal wall wound, she is now been scheduled for surgery next Friday. Patient is very excited about surgery because she had with that for over a year for this time. Her depression is better since surgery was scheduled. Patient needs refill her medications today she has no complaint today. She monitors her blood sugar at home she said it ranges between 95 and 130. She is trying everything she can to control her Blood Sugar Include Diet and Exercise. Patient has No headache, No chest pain, No new weakness tingling or numbness, No Cough - SOB.  No problems updated.  ALLERGIES: Allergies  Allergen Reactions  . Pravastatin Sodium Itching and Rash  . Simvastatin Rash    PAST MEDICAL HISTORY: Past Medical History  Diagnosis Date  . Diabetes mellitus   . Chronic kidney disease   . Colon cancer ([place]) 09/18/13    MEDICATIONS AT HOME: Prior to Admission medications   Medication Sig Start Date End Date Taking? Authorizing Provider  fenofibrate (TRICOR) 145 MG tablet Take 1 tablet (145 mg total) by mouth daily. 09/15/15  Yes Chase Arnall Essie Christine, MD  glipiZIDE (GLUCOTROL) 10 MG tablet Take 1 tablet (10 mg total) by mouth 2 (two) times daily before a meal. 09/15/15  Yes Raliyah Montella E Doreene Burke, MD  glucose monitoring kit (FREESTYLE) monitoring kit 1 each by Does  not apply route 4 (four) times daily - after meals and at bedtime. 1 month Diabetic Testing Supplies for QAC-QHS accuchecks. Any brand okay 08/14/13  Yes Thurnell Lose, MD  Insulin Glargine (LANTUS SOLOSTAR) 100 UNIT/ML Solostar Pen Inject 80 Units into the skin at bedtime. 07/14/15  Yes Tresa Garter, MD  nystatin cream (MYCOSTATIN) Apply 1 application topically 2 (two) times daily. 04/07/15  Yes Tresa Garter, MD  Menthol-Zinc Oxide (CALMOSEPTINE) 0.44-20.625 % OINT Apply to wound 3 times daily Patient not taking: Reported on 08/18/2015 08/04/15   Tresa Garter, MD     Objective:   Filed Vitals:   09/15/15 1435  BP: 154/75  Pulse: 93  Temp: 98.1 F (36.7 C)  TempSrc: Oral  Resp: 20  Height: '5\' 7"'  (1.702 m)  Weight: 236 lb (107.049 kg)  SpO2: 97%    Exam General appearance : Awake, alert, not in any distress. Speech Clear. Not toxic looking,morbidly obese HEENT: Atraumatic and Normocephalic, pupils equally reactive to light and accomodation Neck: supple, no JVD. No cervical lymphadenopathy.  Chest:Good air entry bilaterally, no added sounds  CVS: S1 S2 regular, no murmurs.  Abdomen: abdominal wall wound dressing dry, Bowel sounds present, Non tender and not distended with no gaurding, rigidity or rebound. Extremities: B/L Lower Ext shows no edema, both legs are warm to touch Neurology: Awake alert, and oriented X 3, CN II-XII intact, Non focal  Data Review Lab Results  Component Value  Date   HGBA1C 9.30 09/15/2015   HGBA1C 9.50 08/18/2015   HGBA1C 9.10 07/21/2015     Assessment & Plan   1. Type 2 diabetes mellitus without complication, unspecified long term insulin use status (HCC)  - POCT A1C - Glucose (CBG) - glipiZIDE (GLUCOTROL) 10 MG tablet; Take 1 tablet (10 mg total) by mouth 2 (two) times daily before a meal.  Dispense: 180 tablet; Refill: 3  Aim for 30 minutes of exercise most days. Rethink what you drink. Water is great! Aim for 2-3 Carb  Choices per meal (30-45 grams) +/- 1 either way  Aim for 0-15 Carbs per snack if hungry  Include protein in moderation with your meals and snacks  Consider reading food labels for Total Carbohydrate and Fat Grams of foods  Consider checking BG at alternate times per day  Continue taking medication as directed Be mindful about how much sugar you are adding to beverages and other foods. Fruit Punch - find one with no sugar  Measure and decrease portions of carbohydrate foods  Make your plate and don't go back for seconds   2. Dyslipidemia  - fenofibrate (TRICOR) 145 MG tablet; Take 1 tablet (145 mg total) by mouth daily.  Dispense: 90 tablet; Refill: 3  To address this please limit saturated fat to no more than 7% of your calories, limit cholesterol to 200 mg/day, increase fiber and exercise as tolerated. If needed we may add another cholesterol lowering medication to your regimen.   3. History of colon cancer, no staging status post surgery  Follow-up Gen. Surgery Patient has been scheduled for surgery next Friday. Return to clinic when necessary  Patient have been counseled extensively about nutrition and exercise  Return in about 4 weeks (around 10/13/2015) for Follow up HTN, Abdominal Pain.  The patient was given clear instructions to go to ER or return to medical center if symptoms don't improve, worsen or new problems develop. The patient verbalized understanding. The patient was told to call to get lab results if they haven't heard anything in the next week.   This note has been created with Surveyor, quantity. Any transcriptional errors are unintentional.    Angelica Chessman, MD, [place], [place], [place], [place] and [place], [place]   09/15/2015, 3:02 PM

## 2015-09-16 ENCOUNTER — Other Ambulatory Visit: Payer: Self-pay | Admitting: *Deleted

## 2015-09-16 DIAGNOSIS — N261 Atrophy of kidney (terminal): Secondary | ICD-10-CM | POA: Diagnosis not present

## 2015-09-16 DIAGNOSIS — Z6836 Body mass index (BMI) 36.0-36.9, adult: Secondary | ICD-10-CM | POA: Diagnosis not present

## 2015-09-16 DIAGNOSIS — T8579XD Infection and inflammatory reaction due to other internal prosthetic devices, implants and grafts, subsequent encounter: Secondary | ICD-10-CM | POA: Diagnosis not present

## 2015-09-16 DIAGNOSIS — E669 Obesity, unspecified: Secondary | ICD-10-CM | POA: Diagnosis not present

## 2015-09-16 DIAGNOSIS — T8189XD Other complications of procedures, not elsewhere classified, subsequent encounter: Secondary | ICD-10-CM | POA: Diagnosis not present

## 2015-09-16 DIAGNOSIS — E1165 Type 2 diabetes mellitus with hyperglycemia: Secondary | ICD-10-CM | POA: Diagnosis not present

## 2015-09-16 DIAGNOSIS — IMO0001 Reserved for inherently not codable concepts without codable children: Secondary | ICD-10-CM

## 2015-09-16 MED ORDER — INSULIN GLARGINE 100 UNIT/ML SOLOSTAR PEN: 80.0000 [IU] | PEN_INJECTOR | Freq: Every day | SUBCUTANEOUS | Status: DC

## 2015-09-16 NOTE — Telephone Encounter (Signed)
Patients Lantus has been refilled with 3 refills.

## 2015-09-18 DIAGNOSIS — E1165 Type 2 diabetes mellitus with hyperglycemia: Secondary | ICD-10-CM | POA: Diagnosis not present

## 2015-09-18 DIAGNOSIS — N261 Atrophy of kidney (terminal): Secondary | ICD-10-CM | POA: Diagnosis not present

## 2015-09-18 DIAGNOSIS — T8579XD Infection and inflammatory reaction due to other internal prosthetic devices, implants and grafts, subsequent encounter: Secondary | ICD-10-CM | POA: Diagnosis not present

## 2015-09-18 DIAGNOSIS — Z6836 Body mass index (BMI) 36.0-36.9, adult: Secondary | ICD-10-CM | POA: Diagnosis not present

## 2015-09-18 DIAGNOSIS — E669 Obesity, unspecified: Secondary | ICD-10-CM | POA: Diagnosis not present

## 2015-09-18 DIAGNOSIS — Z794 Long term (current) use of insulin: Secondary | ICD-10-CM | POA: Diagnosis not present

## 2015-09-19 DIAGNOSIS — N261 Atrophy of kidney (terminal): Secondary | ICD-10-CM | POA: Diagnosis not present

## 2015-09-19 DIAGNOSIS — T8579XD Infection and inflammatory reaction due to other internal prosthetic devices, implants and grafts, subsequent encounter: Secondary | ICD-10-CM | POA: Diagnosis not present

## 2015-09-19 DIAGNOSIS — E669 Obesity, unspecified: Secondary | ICD-10-CM | POA: Diagnosis not present

## 2015-09-19 DIAGNOSIS — E1165 Type 2 diabetes mellitus with hyperglycemia: Secondary | ICD-10-CM | POA: Diagnosis not present

## 2015-09-19 DIAGNOSIS — Z794 Long term (current) use of insulin: Secondary | ICD-10-CM | POA: Diagnosis not present

## 2015-09-19 DIAGNOSIS — Z6836 Body mass index (BMI) 36.0-36.9, adult: Secondary | ICD-10-CM | POA: Diagnosis not present

## 2015-09-20 DIAGNOSIS — E669 Obesity, unspecified: Secondary | ICD-10-CM | POA: Diagnosis not present

## 2015-09-20 DIAGNOSIS — Z794 Long term (current) use of insulin: Secondary | ICD-10-CM | POA: Diagnosis not present

## 2015-09-20 DIAGNOSIS — E781 Pure hyperglyceridemia: Secondary | ICD-10-CM | POA: Diagnosis not present

## 2015-09-20 DIAGNOSIS — I10 Essential (primary) hypertension: Secondary | ICD-10-CM | POA: Diagnosis not present

## 2015-09-20 DIAGNOSIS — E785 Hyperlipidemia, unspecified: Secondary | ICD-10-CM | POA: Diagnosis not present

## 2015-09-20 DIAGNOSIS — T8579XA Infection and inflammatory reaction due to other internal prosthetic devices, implants and grafts, initial encounter: Secondary | ICD-10-CM | POA: Diagnosis not present

## 2015-09-20 DIAGNOSIS — Z79899 Other long term (current) drug therapy: Secondary | ICD-10-CM | POA: Diagnosis not present

## 2015-09-20 DIAGNOSIS — E119 Type 2 diabetes mellitus without complications: Secondary | ICD-10-CM | POA: Diagnosis not present

## 2015-09-20 DIAGNOSIS — E1165 Type 2 diabetes mellitus with hyperglycemia: Secondary | ICD-10-CM | POA: Diagnosis not present

## 2015-09-20 DIAGNOSIS — Z136 Encounter for screening for cardiovascular disorders: Secondary | ICD-10-CM | POA: Diagnosis not present

## 2015-09-20 DIAGNOSIS — Z01810 Encounter for preprocedural cardiovascular examination: Secondary | ICD-10-CM | POA: Diagnosis not present

## 2015-09-21 DIAGNOSIS — E669 Obesity, unspecified: Secondary | ICD-10-CM | POA: Diagnosis not present

## 2015-09-21 DIAGNOSIS — E1165 Type 2 diabetes mellitus with hyperglycemia: Secondary | ICD-10-CM | POA: Diagnosis not present

## 2015-09-21 DIAGNOSIS — Z6836 Body mass index (BMI) 36.0-36.9, adult: Secondary | ICD-10-CM | POA: Diagnosis not present

## 2015-09-21 DIAGNOSIS — Z794 Long term (current) use of insulin: Secondary | ICD-10-CM | POA: Diagnosis not present

## 2015-09-21 DIAGNOSIS — N261 Atrophy of kidney (terminal): Secondary | ICD-10-CM | POA: Diagnosis not present

## 2015-09-21 DIAGNOSIS — T8579XD Infection and inflammatory reaction due to other internal prosthetic devices, implants and grafts, subsequent encounter: Secondary | ICD-10-CM | POA: Diagnosis not present

## 2015-09-23 DIAGNOSIS — E1165 Type 2 diabetes mellitus with hyperglycemia: Secondary | ICD-10-CM | POA: Diagnosis not present

## 2015-09-23 DIAGNOSIS — T8579XA Infection and inflammatory reaction due to other internal prosthetic devices, implants and grafts, initial encounter: Secondary | ICD-10-CM | POA: Diagnosis not present

## 2015-09-23 DIAGNOSIS — E119 Type 2 diabetes mellitus without complications: Secondary | ICD-10-CM | POA: Diagnosis not present

## 2015-09-23 DIAGNOSIS — Z794 Long term (current) use of insulin: Secondary | ICD-10-CM | POA: Diagnosis not present

## 2015-09-23 DIAGNOSIS — I10 Essential (primary) hypertension: Secondary | ICD-10-CM | POA: Diagnosis not present

## 2015-09-23 DIAGNOSIS — E785 Hyperlipidemia, unspecified: Secondary | ICD-10-CM | POA: Diagnosis not present

## 2015-09-24 DIAGNOSIS — Z794 Long term (current) use of insulin: Secondary | ICD-10-CM | POA: Diagnosis not present

## 2015-09-24 DIAGNOSIS — E1165 Type 2 diabetes mellitus with hyperglycemia: Secondary | ICD-10-CM | POA: Diagnosis not present

## 2015-09-24 DIAGNOSIS — I10 Essential (primary) hypertension: Secondary | ICD-10-CM | POA: Diagnosis not present

## 2015-09-24 DIAGNOSIS — T8579XA Infection and inflammatory reaction due to other internal prosthetic devices, implants and grafts, initial encounter: Secondary | ICD-10-CM | POA: Diagnosis not present

## 2015-09-24 DIAGNOSIS — E119 Type 2 diabetes mellitus without complications: Secondary | ICD-10-CM | POA: Diagnosis not present

## 2015-09-24 DIAGNOSIS — E785 Hyperlipidemia, unspecified: Secondary | ICD-10-CM | POA: Diagnosis not present

## 2015-09-25 DIAGNOSIS — E669 Obesity, unspecified: Secondary | ICD-10-CM | POA: Diagnosis not present

## 2015-09-25 DIAGNOSIS — Z794 Long term (current) use of insulin: Secondary | ICD-10-CM | POA: Diagnosis not present

## 2015-09-25 DIAGNOSIS — T8579XD Infection and inflammatory reaction due to other internal prosthetic devices, implants and grafts, subsequent encounter: Secondary | ICD-10-CM | POA: Diagnosis not present

## 2015-09-25 DIAGNOSIS — Z6836 Body mass index (BMI) 36.0-36.9, adult: Secondary | ICD-10-CM | POA: Diagnosis not present

## 2015-09-25 DIAGNOSIS — N261 Atrophy of kidney (terminal): Secondary | ICD-10-CM | POA: Diagnosis not present

## 2015-09-25 DIAGNOSIS — E1165 Type 2 diabetes mellitus with hyperglycemia: Secondary | ICD-10-CM | POA: Diagnosis not present

## 2015-09-26 DIAGNOSIS — Z794 Long term (current) use of insulin: Secondary | ICD-10-CM | POA: Diagnosis not present

## 2015-09-26 DIAGNOSIS — E1165 Type 2 diabetes mellitus with hyperglycemia: Secondary | ICD-10-CM | POA: Diagnosis not present

## 2015-09-26 DIAGNOSIS — Z6836 Body mass index (BMI) 36.0-36.9, adult: Secondary | ICD-10-CM | POA: Diagnosis not present

## 2015-09-26 DIAGNOSIS — T8579XD Infection and inflammatory reaction due to other internal prosthetic devices, implants and grafts, subsequent encounter: Secondary | ICD-10-CM | POA: Diagnosis not present

## 2015-09-26 DIAGNOSIS — N261 Atrophy of kidney (terminal): Secondary | ICD-10-CM | POA: Diagnosis not present

## 2015-09-26 DIAGNOSIS — E669 Obesity, unspecified: Secondary | ICD-10-CM | POA: Diagnosis not present

## 2015-09-27 DIAGNOSIS — E669 Obesity, unspecified: Secondary | ICD-10-CM | POA: Diagnosis not present

## 2015-09-27 DIAGNOSIS — T8579XD Infection and inflammatory reaction due to other internal prosthetic devices, implants and grafts, subsequent encounter: Secondary | ICD-10-CM | POA: Diagnosis not present

## 2015-09-27 DIAGNOSIS — E1165 Type 2 diabetes mellitus with hyperglycemia: Secondary | ICD-10-CM | POA: Diagnosis not present

## 2015-09-27 DIAGNOSIS — Z6836 Body mass index (BMI) 36.0-36.9, adult: Secondary | ICD-10-CM | POA: Diagnosis not present

## 2015-09-27 DIAGNOSIS — N261 Atrophy of kidney (terminal): Secondary | ICD-10-CM | POA: Diagnosis not present

## 2015-09-27 DIAGNOSIS — Z794 Long term (current) use of insulin: Secondary | ICD-10-CM | POA: Diagnosis not present

## 2015-09-28 DIAGNOSIS — Z6836 Body mass index (BMI) 36.0-36.9, adult: Secondary | ICD-10-CM | POA: Diagnosis not present

## 2015-09-28 DIAGNOSIS — T8579XD Infection and inflammatory reaction due to other internal prosthetic devices, implants and grafts, subsequent encounter: Secondary | ICD-10-CM | POA: Diagnosis not present

## 2015-09-28 DIAGNOSIS — Z794 Long term (current) use of insulin: Secondary | ICD-10-CM | POA: Diagnosis not present

## 2015-09-28 DIAGNOSIS — N261 Atrophy of kidney (terminal): Secondary | ICD-10-CM | POA: Diagnosis not present

## 2015-09-28 DIAGNOSIS — E669 Obesity, unspecified: Secondary | ICD-10-CM | POA: Diagnosis not present

## 2015-09-28 DIAGNOSIS — E1165 Type 2 diabetes mellitus with hyperglycemia: Secondary | ICD-10-CM | POA: Diagnosis not present

## 2015-09-29 DIAGNOSIS — Z794 Long term (current) use of insulin: Secondary | ICD-10-CM | POA: Diagnosis not present

## 2015-09-29 DIAGNOSIS — E669 Obesity, unspecified: Secondary | ICD-10-CM | POA: Diagnosis not present

## 2015-09-29 DIAGNOSIS — E1165 Type 2 diabetes mellitus with hyperglycemia: Secondary | ICD-10-CM | POA: Diagnosis not present

## 2015-09-29 DIAGNOSIS — Z6836 Body mass index (BMI) 36.0-36.9, adult: Secondary | ICD-10-CM | POA: Diagnosis not present

## 2015-09-29 DIAGNOSIS — T8579XD Infection and inflammatory reaction due to other internal prosthetic devices, implants and grafts, subsequent encounter: Secondary | ICD-10-CM | POA: Diagnosis not present

## 2015-09-29 DIAGNOSIS — N261 Atrophy of kidney (terminal): Secondary | ICD-10-CM | POA: Diagnosis not present

## 2015-09-30 DIAGNOSIS — Z6836 Body mass index (BMI) 36.0-36.9, adult: Secondary | ICD-10-CM | POA: Diagnosis not present

## 2015-09-30 DIAGNOSIS — N261 Atrophy of kidney (terminal): Secondary | ICD-10-CM | POA: Diagnosis not present

## 2015-09-30 DIAGNOSIS — E1165 Type 2 diabetes mellitus with hyperglycemia: Secondary | ICD-10-CM | POA: Diagnosis not present

## 2015-09-30 DIAGNOSIS — T8579XD Infection and inflammatory reaction due to other internal prosthetic devices, implants and grafts, subsequent encounter: Secondary | ICD-10-CM | POA: Diagnosis not present

## 2015-09-30 DIAGNOSIS — Z794 Long term (current) use of insulin: Secondary | ICD-10-CM | POA: Diagnosis not present

## 2015-09-30 DIAGNOSIS — E669 Obesity, unspecified: Secondary | ICD-10-CM | POA: Diagnosis not present

## 2015-10-01 DIAGNOSIS — E669 Obesity, unspecified: Secondary | ICD-10-CM | POA: Diagnosis not present

## 2015-10-01 DIAGNOSIS — Z6836 Body mass index (BMI) 36.0-36.9, adult: Secondary | ICD-10-CM | POA: Diagnosis not present

## 2015-10-01 DIAGNOSIS — T8579XD Infection and inflammatory reaction due to other internal prosthetic devices, implants and grafts, subsequent encounter: Secondary | ICD-10-CM | POA: Diagnosis not present

## 2015-10-01 DIAGNOSIS — E1165 Type 2 diabetes mellitus with hyperglycemia: Secondary | ICD-10-CM | POA: Diagnosis not present

## 2015-10-01 DIAGNOSIS — N261 Atrophy of kidney (terminal): Secondary | ICD-10-CM | POA: Diagnosis not present

## 2015-10-01 DIAGNOSIS — Z794 Long term (current) use of insulin: Secondary | ICD-10-CM | POA: Diagnosis not present

## 2015-10-02 DIAGNOSIS — E1165 Type 2 diabetes mellitus with hyperglycemia: Secondary | ICD-10-CM | POA: Diagnosis not present

## 2015-10-02 DIAGNOSIS — T8579XD Infection and inflammatory reaction due to other internal prosthetic devices, implants and grafts, subsequent encounter: Secondary | ICD-10-CM | POA: Diagnosis not present

## 2015-10-02 DIAGNOSIS — E669 Obesity, unspecified: Secondary | ICD-10-CM | POA: Diagnosis not present

## 2015-10-02 DIAGNOSIS — Z794 Long term (current) use of insulin: Secondary | ICD-10-CM | POA: Diagnosis not present

## 2015-10-02 DIAGNOSIS — N261 Atrophy of kidney (terminal): Secondary | ICD-10-CM | POA: Diagnosis not present

## 2015-10-02 DIAGNOSIS — Z6836 Body mass index (BMI) 36.0-36.9, adult: Secondary | ICD-10-CM | POA: Diagnosis not present

## 2015-10-03 ENCOUNTER — Telehealth: Payer: Self-pay | Admitting: Internal Medicine

## 2015-10-03 DIAGNOSIS — E669 Obesity, unspecified: Secondary | ICD-10-CM | POA: Diagnosis not present

## 2015-10-03 DIAGNOSIS — E1165 Type 2 diabetes mellitus with hyperglycemia: Secondary | ICD-10-CM | POA: Diagnosis not present

## 2015-10-03 DIAGNOSIS — T8579XD Infection and inflammatory reaction due to other internal prosthetic devices, implants and grafts, subsequent encounter: Secondary | ICD-10-CM | POA: Diagnosis not present

## 2015-10-03 DIAGNOSIS — N261 Atrophy of kidney (terminal): Secondary | ICD-10-CM | POA: Diagnosis not present

## 2015-10-03 DIAGNOSIS — Z794 Long term (current) use of insulin: Secondary | ICD-10-CM | POA: Diagnosis not present

## 2015-10-03 DIAGNOSIS — Z6836 Body mass index (BMI) 36.0-36.9, adult: Secondary | ICD-10-CM | POA: Diagnosis not present

## 2015-10-03 NOTE — Telephone Encounter (Signed)
Patient called stating that she would like a shingles vaccine. Please f/u

## 2015-10-04 NOTE — Telephone Encounter (Signed)
Patient verified DOB Patient was advised by Surgeon to have shingles vaccine. Patient was informed of [facility] not carrying the shingles vaccine. A prescription can be written for her to take to a commercial pharmacy. Patient is going to do some research on pricing for the vaccine and call the facility back.

## 2015-10-05 DIAGNOSIS — T8579XD Infection and inflammatory reaction due to other internal prosthetic devices, implants and grafts, subsequent encounter: Secondary | ICD-10-CM | POA: Diagnosis not present

## 2015-10-05 DIAGNOSIS — E669 Obesity, unspecified: Secondary | ICD-10-CM | POA: Diagnosis not present

## 2015-10-05 DIAGNOSIS — E1165 Type 2 diabetes mellitus with hyperglycemia: Secondary | ICD-10-CM | POA: Diagnosis not present

## 2015-10-05 DIAGNOSIS — N261 Atrophy of kidney (terminal): Secondary | ICD-10-CM | POA: Diagnosis not present

## 2015-10-05 DIAGNOSIS — Z794 Long term (current) use of insulin: Secondary | ICD-10-CM | POA: Diagnosis not present

## 2015-10-05 DIAGNOSIS — Z6836 Body mass index (BMI) 36.0-36.9, adult: Secondary | ICD-10-CM | POA: Diagnosis not present

## 2015-10-07 DIAGNOSIS — T8579XD Infection and inflammatory reaction due to other internal prosthetic devices, implants and grafts, subsequent encounter: Secondary | ICD-10-CM | POA: Diagnosis not present

## 2015-10-07 DIAGNOSIS — E1165 Type 2 diabetes mellitus with hyperglycemia: Secondary | ICD-10-CM | POA: Diagnosis not present

## 2015-10-07 DIAGNOSIS — N261 Atrophy of kidney (terminal): Secondary | ICD-10-CM | POA: Diagnosis not present

## 2015-10-07 DIAGNOSIS — E669 Obesity, unspecified: Secondary | ICD-10-CM | POA: Diagnosis not present

## 2015-10-07 DIAGNOSIS — Z6836 Body mass index (BMI) 36.0-36.9, adult: Secondary | ICD-10-CM | POA: Diagnosis not present

## 2015-10-07 DIAGNOSIS — Z794 Long term (current) use of insulin: Secondary | ICD-10-CM | POA: Diagnosis not present

## 2015-10-10 DIAGNOSIS — E1165 Type 2 diabetes mellitus with hyperglycemia: Secondary | ICD-10-CM | POA: Diagnosis not present

## 2015-10-10 DIAGNOSIS — Z6836 Body mass index (BMI) 36.0-36.9, adult: Secondary | ICD-10-CM | POA: Diagnosis not present

## 2015-10-10 DIAGNOSIS — Z794 Long term (current) use of insulin: Secondary | ICD-10-CM | POA: Diagnosis not present

## 2015-10-10 DIAGNOSIS — N261 Atrophy of kidney (terminal): Secondary | ICD-10-CM | POA: Diagnosis not present

## 2015-10-10 DIAGNOSIS — T8579XD Infection and inflammatory reaction due to other internal prosthetic devices, implants and grafts, subsequent encounter: Secondary | ICD-10-CM | POA: Diagnosis not present

## 2015-10-10 DIAGNOSIS — E669 Obesity, unspecified: Secondary | ICD-10-CM | POA: Diagnosis not present

## 2015-10-12 DIAGNOSIS — E669 Obesity, unspecified: Secondary | ICD-10-CM | POA: Diagnosis not present

## 2015-10-12 DIAGNOSIS — Z6836 Body mass index (BMI) 36.0-36.9, adult: Secondary | ICD-10-CM | POA: Diagnosis not present

## 2015-10-12 DIAGNOSIS — T8579XD Infection and inflammatory reaction due to other internal prosthetic devices, implants and grafts, subsequent encounter: Secondary | ICD-10-CM | POA: Diagnosis not present

## 2015-10-12 DIAGNOSIS — N261 Atrophy of kidney (terminal): Secondary | ICD-10-CM | POA: Diagnosis not present

## 2015-10-12 DIAGNOSIS — Z794 Long term (current) use of insulin: Secondary | ICD-10-CM | POA: Diagnosis not present

## 2015-10-12 DIAGNOSIS — E1165 Type 2 diabetes mellitus with hyperglycemia: Secondary | ICD-10-CM | POA: Diagnosis not present

## 2015-10-14 DIAGNOSIS — Z6836 Body mass index (BMI) 36.0-36.9, adult: Secondary | ICD-10-CM | POA: Diagnosis not present

## 2015-10-14 DIAGNOSIS — E669 Obesity, unspecified: Secondary | ICD-10-CM | POA: Diagnosis not present

## 2015-10-14 DIAGNOSIS — T8579XD Infection and inflammatory reaction due to other internal prosthetic devices, implants and grafts, subsequent encounter: Secondary | ICD-10-CM | POA: Diagnosis not present

## 2015-10-14 DIAGNOSIS — N261 Atrophy of kidney (terminal): Secondary | ICD-10-CM | POA: Diagnosis not present

## 2015-10-14 DIAGNOSIS — Z794 Long term (current) use of insulin: Secondary | ICD-10-CM | POA: Diagnosis not present

## 2015-10-14 DIAGNOSIS — E1165 Type 2 diabetes mellitus with hyperglycemia: Secondary | ICD-10-CM | POA: Diagnosis not present

## 2015-10-17 DIAGNOSIS — T8579XD Infection and inflammatory reaction due to other internal prosthetic devices, implants and grafts, subsequent encounter: Secondary | ICD-10-CM | POA: Diagnosis not present

## 2015-10-17 DIAGNOSIS — E669 Obesity, unspecified: Secondary | ICD-10-CM | POA: Diagnosis not present

## 2015-10-17 DIAGNOSIS — N261 Atrophy of kidney (terminal): Secondary | ICD-10-CM | POA: Diagnosis not present

## 2015-10-17 DIAGNOSIS — Z794 Long term (current) use of insulin: Secondary | ICD-10-CM | POA: Diagnosis not present

## 2015-10-17 DIAGNOSIS — Z6836 Body mass index (BMI) 36.0-36.9, adult: Secondary | ICD-10-CM | POA: Diagnosis not present

## 2015-10-17 DIAGNOSIS — E1165 Type 2 diabetes mellitus with hyperglycemia: Secondary | ICD-10-CM | POA: Diagnosis not present

## 2015-10-19 DIAGNOSIS — Z6836 Body mass index (BMI) 36.0-36.9, adult: Secondary | ICD-10-CM | POA: Diagnosis not present

## 2015-10-19 DIAGNOSIS — N261 Atrophy of kidney (terminal): Secondary | ICD-10-CM | POA: Diagnosis not present

## 2015-10-19 DIAGNOSIS — E669 Obesity, unspecified: Secondary | ICD-10-CM | POA: Diagnosis not present

## 2015-10-19 DIAGNOSIS — E1165 Type 2 diabetes mellitus with hyperglycemia: Secondary | ICD-10-CM | POA: Diagnosis not present

## 2015-10-19 DIAGNOSIS — T8579XD Infection and inflammatory reaction due to other internal prosthetic devices, implants and grafts, subsequent encounter: Secondary | ICD-10-CM | POA: Diagnosis not present

## 2015-10-19 DIAGNOSIS — Z794 Long term (current) use of insulin: Secondary | ICD-10-CM | POA: Diagnosis not present

## 2015-10-20 DIAGNOSIS — S31109D Unspecified open wound of abdominal wall, unspecified quadrant without penetration into peritoneal cavity, subsequent encounter: Secondary | ICD-10-CM | POA: Diagnosis not present

## 2015-10-20 DIAGNOSIS — K9181 Other intraoperative complications of digestive system: Secondary | ICD-10-CM | POA: Diagnosis not present

## 2015-10-21 DIAGNOSIS — Z794 Long term (current) use of insulin: Secondary | ICD-10-CM | POA: Diagnosis not present

## 2015-10-21 DIAGNOSIS — E669 Obesity, unspecified: Secondary | ICD-10-CM | POA: Diagnosis not present

## 2015-10-21 DIAGNOSIS — E1165 Type 2 diabetes mellitus with hyperglycemia: Secondary | ICD-10-CM | POA: Diagnosis not present

## 2015-10-21 DIAGNOSIS — T8579XD Infection and inflammatory reaction due to other internal prosthetic devices, implants and grafts, subsequent encounter: Secondary | ICD-10-CM | POA: Diagnosis not present

## 2015-10-21 DIAGNOSIS — Z6836 Body mass index (BMI) 36.0-36.9, adult: Secondary | ICD-10-CM | POA: Diagnosis not present

## 2015-10-21 DIAGNOSIS — N261 Atrophy of kidney (terminal): Secondary | ICD-10-CM | POA: Diagnosis not present

## 2015-10-24 DIAGNOSIS — Z6836 Body mass index (BMI) 36.0-36.9, adult: Secondary | ICD-10-CM | POA: Diagnosis not present

## 2015-10-24 DIAGNOSIS — Z794 Long term (current) use of insulin: Secondary | ICD-10-CM | POA: Diagnosis not present

## 2015-10-24 DIAGNOSIS — N261 Atrophy of kidney (terminal): Secondary | ICD-10-CM | POA: Diagnosis not present

## 2015-10-24 DIAGNOSIS — T8579XD Infection and inflammatory reaction due to other internal prosthetic devices, implants and grafts, subsequent encounter: Secondary | ICD-10-CM | POA: Diagnosis not present

## 2015-10-24 DIAGNOSIS — E1165 Type 2 diabetes mellitus with hyperglycemia: Secondary | ICD-10-CM | POA: Diagnosis not present

## 2015-10-24 DIAGNOSIS — E669 Obesity, unspecified: Secondary | ICD-10-CM | POA: Diagnosis not present

## 2015-10-26 DIAGNOSIS — T8579XD Infection and inflammatory reaction due to other internal prosthetic devices, implants and grafts, subsequent encounter: Secondary | ICD-10-CM | POA: Diagnosis not present

## 2015-10-26 DIAGNOSIS — Z794 Long term (current) use of insulin: Secondary | ICD-10-CM | POA: Diagnosis not present

## 2015-10-26 DIAGNOSIS — E669 Obesity, unspecified: Secondary | ICD-10-CM | POA: Diagnosis not present

## 2015-10-26 DIAGNOSIS — Z6836 Body mass index (BMI) 36.0-36.9, adult: Secondary | ICD-10-CM | POA: Diagnosis not present

## 2015-10-26 DIAGNOSIS — E1165 Type 2 diabetes mellitus with hyperglycemia: Secondary | ICD-10-CM | POA: Diagnosis not present

## 2015-10-26 DIAGNOSIS — N261 Atrophy of kidney (terminal): Secondary | ICD-10-CM | POA: Diagnosis not present

## 2015-10-28 DIAGNOSIS — E1165 Type 2 diabetes mellitus with hyperglycemia: Secondary | ICD-10-CM | POA: Diagnosis not present

## 2015-10-28 DIAGNOSIS — Z6836 Body mass index (BMI) 36.0-36.9, adult: Secondary | ICD-10-CM | POA: Diagnosis not present

## 2015-10-28 DIAGNOSIS — N261 Atrophy of kidney (terminal): Secondary | ICD-10-CM | POA: Diagnosis not present

## 2015-10-28 DIAGNOSIS — T8579XD Infection and inflammatory reaction due to other internal prosthetic devices, implants and grafts, subsequent encounter: Secondary | ICD-10-CM | POA: Diagnosis not present

## 2015-10-28 DIAGNOSIS — Z794 Long term (current) use of insulin: Secondary | ICD-10-CM | POA: Diagnosis not present

## 2015-10-28 DIAGNOSIS — E669 Obesity, unspecified: Secondary | ICD-10-CM | POA: Diagnosis not present

## 2015-10-31 DIAGNOSIS — Z6836 Body mass index (BMI) 36.0-36.9, adult: Secondary | ICD-10-CM | POA: Diagnosis not present

## 2015-10-31 DIAGNOSIS — Z794 Long term (current) use of insulin: Secondary | ICD-10-CM | POA: Diagnosis not present

## 2015-10-31 DIAGNOSIS — E1165 Type 2 diabetes mellitus with hyperglycemia: Secondary | ICD-10-CM | POA: Diagnosis not present

## 2015-10-31 DIAGNOSIS — N261 Atrophy of kidney (terminal): Secondary | ICD-10-CM | POA: Diagnosis not present

## 2015-10-31 DIAGNOSIS — E669 Obesity, unspecified: Secondary | ICD-10-CM | POA: Diagnosis not present

## 2015-10-31 DIAGNOSIS — T8579XD Infection and inflammatory reaction due to other internal prosthetic devices, implants and grafts, subsequent encounter: Secondary | ICD-10-CM | POA: Diagnosis not present

## 2015-11-02 DIAGNOSIS — Z6836 Body mass index (BMI) 36.0-36.9, adult: Secondary | ICD-10-CM | POA: Diagnosis not present

## 2015-11-02 DIAGNOSIS — Z794 Long term (current) use of insulin: Secondary | ICD-10-CM | POA: Diagnosis not present

## 2015-11-02 DIAGNOSIS — N261 Atrophy of kidney (terminal): Secondary | ICD-10-CM | POA: Diagnosis not present

## 2015-11-02 DIAGNOSIS — T8579XD Infection and inflammatory reaction due to other internal prosthetic devices, implants and grafts, subsequent encounter: Secondary | ICD-10-CM | POA: Diagnosis not present

## 2015-11-02 DIAGNOSIS — E1165 Type 2 diabetes mellitus with hyperglycemia: Secondary | ICD-10-CM | POA: Diagnosis not present

## 2015-11-02 DIAGNOSIS — E669 Obesity, unspecified: Secondary | ICD-10-CM | POA: Diagnosis not present

## 2015-11-02 NOTE — Telephone Encounter (Signed)
error 

## 2015-11-04 DIAGNOSIS — T8579XD Infection and inflammatory reaction due to other internal prosthetic devices, implants and grafts, subsequent encounter: Secondary | ICD-10-CM | POA: Diagnosis not present

## 2015-11-04 DIAGNOSIS — N261 Atrophy of kidney (terminal): Secondary | ICD-10-CM | POA: Diagnosis not present

## 2015-11-04 DIAGNOSIS — Z6836 Body mass index (BMI) 36.0-36.9, adult: Secondary | ICD-10-CM | POA: Diagnosis not present

## 2015-11-04 DIAGNOSIS — E1165 Type 2 diabetes mellitus with hyperglycemia: Secondary | ICD-10-CM | POA: Diagnosis not present

## 2015-11-04 DIAGNOSIS — Z794 Long term (current) use of insulin: Secondary | ICD-10-CM | POA: Diagnosis not present

## 2015-11-04 DIAGNOSIS — E669 Obesity, unspecified: Secondary | ICD-10-CM | POA: Diagnosis not present

## 2015-11-08 DIAGNOSIS — Z6836 Body mass index (BMI) 36.0-36.9, adult: Secondary | ICD-10-CM | POA: Diagnosis not present

## 2015-11-08 DIAGNOSIS — Z794 Long term (current) use of insulin: Secondary | ICD-10-CM | POA: Diagnosis not present

## 2015-11-08 DIAGNOSIS — N261 Atrophy of kidney (terminal): Secondary | ICD-10-CM | POA: Diagnosis not present

## 2015-11-08 DIAGNOSIS — E1165 Type 2 diabetes mellitus with hyperglycemia: Secondary | ICD-10-CM | POA: Diagnosis not present

## 2015-11-08 DIAGNOSIS — E669 Obesity, unspecified: Secondary | ICD-10-CM | POA: Diagnosis not present

## 2015-11-08 DIAGNOSIS — T8579XD Infection and inflammatory reaction due to other internal prosthetic devices, implants and grafts, subsequent encounter: Secondary | ICD-10-CM | POA: Diagnosis not present

## 2015-11-11 DIAGNOSIS — N261 Atrophy of kidney (terminal): Secondary | ICD-10-CM | POA: Diagnosis not present

## 2015-11-11 DIAGNOSIS — E669 Obesity, unspecified: Secondary | ICD-10-CM | POA: Diagnosis not present

## 2015-11-11 DIAGNOSIS — Z6836 Body mass index (BMI) 36.0-36.9, adult: Secondary | ICD-10-CM | POA: Diagnosis not present

## 2015-11-11 DIAGNOSIS — T8579XD Infection and inflammatory reaction due to other internal prosthetic devices, implants and grafts, subsequent encounter: Secondary | ICD-10-CM | POA: Diagnosis not present

## 2015-11-11 DIAGNOSIS — Z794 Long term (current) use of insulin: Secondary | ICD-10-CM | POA: Diagnosis not present

## 2015-11-11 DIAGNOSIS — E1165 Type 2 diabetes mellitus with hyperglycemia: Secondary | ICD-10-CM | POA: Diagnosis not present

## 2015-11-14 DIAGNOSIS — E1165 Type 2 diabetes mellitus with hyperglycemia: Secondary | ICD-10-CM | POA: Diagnosis not present

## 2015-11-14 DIAGNOSIS — T8579XD Infection and inflammatory reaction due to other internal prosthetic devices, implants and grafts, subsequent encounter: Secondary | ICD-10-CM | POA: Diagnosis not present

## 2015-11-14 DIAGNOSIS — Z6836 Body mass index (BMI) 36.0-36.9, adult: Secondary | ICD-10-CM | POA: Diagnosis not present

## 2015-11-14 DIAGNOSIS — Z794 Long term (current) use of insulin: Secondary | ICD-10-CM | POA: Diagnosis not present

## 2015-11-14 DIAGNOSIS — N261 Atrophy of kidney (terminal): Secondary | ICD-10-CM | POA: Diagnosis not present

## 2015-11-14 DIAGNOSIS — E669 Obesity, unspecified: Secondary | ICD-10-CM | POA: Diagnosis not present

## 2015-11-16 DIAGNOSIS — Z6836 Body mass index (BMI) 36.0-36.9, adult: Secondary | ICD-10-CM | POA: Diagnosis not present

## 2015-11-16 DIAGNOSIS — T8579XD Infection and inflammatory reaction due to other internal prosthetic devices, implants and grafts, subsequent encounter: Secondary | ICD-10-CM | POA: Diagnosis not present

## 2015-11-16 DIAGNOSIS — N261 Atrophy of kidney (terminal): Secondary | ICD-10-CM | POA: Diagnosis not present

## 2015-11-16 DIAGNOSIS — E669 Obesity, unspecified: Secondary | ICD-10-CM | POA: Diagnosis not present

## 2015-11-16 DIAGNOSIS — Z794 Long term (current) use of insulin: Secondary | ICD-10-CM | POA: Diagnosis not present

## 2015-11-16 DIAGNOSIS — E1165 Type 2 diabetes mellitus with hyperglycemia: Secondary | ICD-10-CM | POA: Diagnosis not present

## 2015-11-16 MED FILL — ?FENOFIBRATE 145 MG TABLET: 145 | 30 days supply | Qty: 30 | Fill #2

## 2015-11-16 MED FILL — glipiZIDE 10 MG TABS: 10 | 30 days supply | Qty: 60 | Fill #2

## 2015-11-17 DIAGNOSIS — Z6836 Body mass index (BMI) 36.0-36.9, adult: Secondary | ICD-10-CM | POA: Diagnosis not present

## 2015-11-17 DIAGNOSIS — T8579XD Infection and inflammatory reaction due to other internal prosthetic devices, implants and grafts, subsequent encounter: Secondary | ICD-10-CM | POA: Diagnosis not present

## 2015-11-17 DIAGNOSIS — N261 Atrophy of kidney (terminal): Secondary | ICD-10-CM | POA: Diagnosis not present

## 2015-11-17 DIAGNOSIS — Z794 Long term (current) use of insulin: Secondary | ICD-10-CM | POA: Diagnosis not present

## 2015-11-17 DIAGNOSIS — E119 Type 2 diabetes mellitus without complications: Secondary | ICD-10-CM | POA: Diagnosis not present

## 2015-11-17 DIAGNOSIS — E669 Obesity, unspecified: Secondary | ICD-10-CM | POA: Diagnosis not present

## 2015-11-18 DIAGNOSIS — E669 Obesity, unspecified: Secondary | ICD-10-CM | POA: Diagnosis not present

## 2015-11-18 DIAGNOSIS — T8579XD Infection and inflammatory reaction due to other internal prosthetic devices, implants and grafts, subsequent encounter: Secondary | ICD-10-CM | POA: Diagnosis not present

## 2015-11-18 DIAGNOSIS — N261 Atrophy of kidney (terminal): Secondary | ICD-10-CM | POA: Diagnosis not present

## 2015-11-18 DIAGNOSIS — E119 Type 2 diabetes mellitus without complications: Secondary | ICD-10-CM | POA: Diagnosis not present

## 2015-11-18 DIAGNOSIS — Z6836 Body mass index (BMI) 36.0-36.9, adult: Secondary | ICD-10-CM | POA: Diagnosis not present

## 2015-11-18 DIAGNOSIS — Z794 Long term (current) use of insulin: Secondary | ICD-10-CM | POA: Diagnosis not present

## 2015-11-21 DIAGNOSIS — Z794 Long term (current) use of insulin: Secondary | ICD-10-CM | POA: Diagnosis not present

## 2015-11-21 DIAGNOSIS — N261 Atrophy of kidney (terminal): Secondary | ICD-10-CM | POA: Diagnosis not present

## 2015-11-21 DIAGNOSIS — T8579XD Infection and inflammatory reaction due to other internal prosthetic devices, implants and grafts, subsequent encounter: Secondary | ICD-10-CM | POA: Diagnosis not present

## 2015-11-21 DIAGNOSIS — E119 Type 2 diabetes mellitus without complications: Secondary | ICD-10-CM | POA: Diagnosis not present

## 2015-11-21 DIAGNOSIS — E669 Obesity, unspecified: Secondary | ICD-10-CM | POA: Diagnosis not present

## 2015-11-21 DIAGNOSIS — Z6836 Body mass index (BMI) 36.0-36.9, adult: Secondary | ICD-10-CM | POA: Diagnosis not present

## 2015-11-23 DIAGNOSIS — E669 Obesity, unspecified: Secondary | ICD-10-CM | POA: Diagnosis not present

## 2015-11-23 DIAGNOSIS — Z6836 Body mass index (BMI) 36.0-36.9, adult: Secondary | ICD-10-CM | POA: Diagnosis not present

## 2015-11-23 DIAGNOSIS — N261 Atrophy of kidney (terminal): Secondary | ICD-10-CM | POA: Diagnosis not present

## 2015-11-23 DIAGNOSIS — Z794 Long term (current) use of insulin: Secondary | ICD-10-CM | POA: Diagnosis not present

## 2015-11-23 DIAGNOSIS — E119 Type 2 diabetes mellitus without complications: Secondary | ICD-10-CM | POA: Diagnosis not present

## 2015-11-23 DIAGNOSIS — T8579XD Infection and inflammatory reaction due to other internal prosthetic devices, implants and grafts, subsequent encounter: Secondary | ICD-10-CM | POA: Diagnosis not present

## 2015-11-25 DIAGNOSIS — Z794 Long term (current) use of insulin: Secondary | ICD-10-CM | POA: Diagnosis not present

## 2015-11-25 DIAGNOSIS — N261 Atrophy of kidney (terminal): Secondary | ICD-10-CM | POA: Diagnosis not present

## 2015-11-25 DIAGNOSIS — E119 Type 2 diabetes mellitus without complications: Secondary | ICD-10-CM | POA: Diagnosis not present

## 2015-11-25 DIAGNOSIS — E669 Obesity, unspecified: Secondary | ICD-10-CM | POA: Diagnosis not present

## 2015-11-25 DIAGNOSIS — Z6836 Body mass index (BMI) 36.0-36.9, adult: Secondary | ICD-10-CM | POA: Diagnosis not present

## 2015-11-25 DIAGNOSIS — T8579XD Infection and inflammatory reaction due to other internal prosthetic devices, implants and grafts, subsequent encounter: Secondary | ICD-10-CM | POA: Diagnosis not present

## 2015-11-28 DIAGNOSIS — E669 Obesity, unspecified: Secondary | ICD-10-CM | POA: Diagnosis not present

## 2015-11-28 DIAGNOSIS — Z6836 Body mass index (BMI) 36.0-36.9, adult: Secondary | ICD-10-CM | POA: Diagnosis not present

## 2015-11-28 DIAGNOSIS — Z794 Long term (current) use of insulin: Secondary | ICD-10-CM | POA: Diagnosis not present

## 2015-11-28 DIAGNOSIS — N261 Atrophy of kidney (terminal): Secondary | ICD-10-CM | POA: Diagnosis not present

## 2015-11-28 DIAGNOSIS — E119 Type 2 diabetes mellitus without complications: Secondary | ICD-10-CM | POA: Diagnosis not present

## 2015-11-28 DIAGNOSIS — T8579XD Infection and inflammatory reaction due to other internal prosthetic devices, implants and grafts, subsequent encounter: Secondary | ICD-10-CM | POA: Diagnosis not present

## 2015-11-30 DIAGNOSIS — N261 Atrophy of kidney (terminal): Secondary | ICD-10-CM | POA: Diagnosis not present

## 2015-11-30 DIAGNOSIS — E119 Type 2 diabetes mellitus without complications: Secondary | ICD-10-CM | POA: Diagnosis not present

## 2015-11-30 DIAGNOSIS — E669 Obesity, unspecified: Secondary | ICD-10-CM | POA: Diagnosis not present

## 2015-11-30 DIAGNOSIS — Z794 Long term (current) use of insulin: Secondary | ICD-10-CM | POA: Diagnosis not present

## 2015-11-30 DIAGNOSIS — Z6836 Body mass index (BMI) 36.0-36.9, adult: Secondary | ICD-10-CM | POA: Diagnosis not present

## 2015-11-30 DIAGNOSIS — T8579XD Infection and inflammatory reaction due to other internal prosthetic devices, implants and grafts, subsequent encounter: Secondary | ICD-10-CM | POA: Diagnosis not present

## 2015-12-02 DIAGNOSIS — Z794 Long term (current) use of insulin: Secondary | ICD-10-CM | POA: Diagnosis not present

## 2015-12-02 DIAGNOSIS — E119 Type 2 diabetes mellitus without complications: Secondary | ICD-10-CM | POA: Diagnosis not present

## 2015-12-02 DIAGNOSIS — N261 Atrophy of kidney (terminal): Secondary | ICD-10-CM | POA: Diagnosis not present

## 2015-12-02 DIAGNOSIS — Z6836 Body mass index (BMI) 36.0-36.9, adult: Secondary | ICD-10-CM | POA: Diagnosis not present

## 2015-12-02 DIAGNOSIS — E669 Obesity, unspecified: Secondary | ICD-10-CM | POA: Diagnosis not present

## 2015-12-02 DIAGNOSIS — T8579XD Infection and inflammatory reaction due to other internal prosthetic devices, implants and grafts, subsequent encounter: Secondary | ICD-10-CM | POA: Diagnosis not present

## 2015-12-05 DIAGNOSIS — E119 Type 2 diabetes mellitus without complications: Secondary | ICD-10-CM | POA: Diagnosis not present

## 2015-12-05 DIAGNOSIS — Z6836 Body mass index (BMI) 36.0-36.9, adult: Secondary | ICD-10-CM | POA: Diagnosis not present

## 2015-12-05 DIAGNOSIS — E669 Obesity, unspecified: Secondary | ICD-10-CM | POA: Diagnosis not present

## 2015-12-05 DIAGNOSIS — Z794 Long term (current) use of insulin: Secondary | ICD-10-CM | POA: Diagnosis not present

## 2015-12-05 DIAGNOSIS — N261 Atrophy of kidney (terminal): Secondary | ICD-10-CM | POA: Diagnosis not present

## 2015-12-05 DIAGNOSIS — T8579XD Infection and inflammatory reaction due to other internal prosthetic devices, implants and grafts, subsequent encounter: Secondary | ICD-10-CM | POA: Diagnosis not present

## 2015-12-07 DIAGNOSIS — T8579XD Infection and inflammatory reaction due to other internal prosthetic devices, implants and grafts, subsequent encounter: Secondary | ICD-10-CM | POA: Diagnosis not present

## 2015-12-07 DIAGNOSIS — N261 Atrophy of kidney (terminal): Secondary | ICD-10-CM | POA: Diagnosis not present

## 2015-12-07 DIAGNOSIS — Z794 Long term (current) use of insulin: Secondary | ICD-10-CM | POA: Diagnosis not present

## 2015-12-07 DIAGNOSIS — E119 Type 2 diabetes mellitus without complications: Secondary | ICD-10-CM | POA: Diagnosis not present

## 2015-12-07 DIAGNOSIS — Z6836 Body mass index (BMI) 36.0-36.9, adult: Secondary | ICD-10-CM | POA: Diagnosis not present

## 2015-12-07 DIAGNOSIS — E669 Obesity, unspecified: Secondary | ICD-10-CM | POA: Diagnosis not present

## 2015-12-09 DIAGNOSIS — T8579XD Infection and inflammatory reaction due to other internal prosthetic devices, implants and grafts, subsequent encounter: Secondary | ICD-10-CM | POA: Diagnosis not present

## 2015-12-09 DIAGNOSIS — Z794 Long term (current) use of insulin: Secondary | ICD-10-CM | POA: Diagnosis not present

## 2015-12-09 DIAGNOSIS — Z6836 Body mass index (BMI) 36.0-36.9, adult: Secondary | ICD-10-CM | POA: Diagnosis not present

## 2015-12-09 DIAGNOSIS — E119 Type 2 diabetes mellitus without complications: Secondary | ICD-10-CM | POA: Diagnosis not present

## 2015-12-09 DIAGNOSIS — N261 Atrophy of kidney (terminal): Secondary | ICD-10-CM | POA: Diagnosis not present

## 2015-12-09 DIAGNOSIS — E669 Obesity, unspecified: Secondary | ICD-10-CM | POA: Diagnosis not present

## 2015-12-12 DIAGNOSIS — Z794 Long term (current) use of insulin: Secondary | ICD-10-CM | POA: Diagnosis not present

## 2015-12-12 DIAGNOSIS — Z6836 Body mass index (BMI) 36.0-36.9, adult: Secondary | ICD-10-CM | POA: Diagnosis not present

## 2015-12-12 DIAGNOSIS — T8579XD Infection and inflammatory reaction due to other internal prosthetic devices, implants and grafts, subsequent encounter: Secondary | ICD-10-CM | POA: Diagnosis not present

## 2015-12-12 DIAGNOSIS — N261 Atrophy of kidney (terminal): Secondary | ICD-10-CM | POA: Diagnosis not present

## 2015-12-12 DIAGNOSIS — E119 Type 2 diabetes mellitus without complications: Secondary | ICD-10-CM | POA: Diagnosis not present

## 2015-12-12 DIAGNOSIS — E669 Obesity, unspecified: Secondary | ICD-10-CM | POA: Diagnosis not present

## 2015-12-14 DIAGNOSIS — E669 Obesity, unspecified: Secondary | ICD-10-CM | POA: Diagnosis not present

## 2015-12-14 DIAGNOSIS — E119 Type 2 diabetes mellitus without complications: Secondary | ICD-10-CM | POA: Diagnosis not present

## 2015-12-14 DIAGNOSIS — Z6836 Body mass index (BMI) 36.0-36.9, adult: Secondary | ICD-10-CM | POA: Diagnosis not present

## 2015-12-14 DIAGNOSIS — N261 Atrophy of kidney (terminal): Secondary | ICD-10-CM | POA: Diagnosis not present

## 2015-12-14 DIAGNOSIS — Z794 Long term (current) use of insulin: Secondary | ICD-10-CM | POA: Diagnosis not present

## 2015-12-14 DIAGNOSIS — T8579XD Infection and inflammatory reaction due to other internal prosthetic devices, implants and grafts, subsequent encounter: Secondary | ICD-10-CM | POA: Diagnosis not present

## 2015-12-15 MED FILL — ?FENOFIBRATE 145 MG TABLET: 145 | 30 days supply | Qty: 30 | Fill #3

## 2015-12-15 MED FILL — glipiZIDE 10 MG TABS: 10 | 30 days supply | Qty: 60 | Fill #3

## 2015-12-16 DIAGNOSIS — N261 Atrophy of kidney (terminal): Secondary | ICD-10-CM | POA: Diagnosis not present

## 2015-12-16 DIAGNOSIS — T8579XD Infection and inflammatory reaction due to other internal prosthetic devices, implants and grafts, subsequent encounter: Secondary | ICD-10-CM | POA: Diagnosis not present

## 2015-12-16 DIAGNOSIS — Z6836 Body mass index (BMI) 36.0-36.9, adult: Secondary | ICD-10-CM | POA: Diagnosis not present

## 2015-12-16 DIAGNOSIS — E669 Obesity, unspecified: Secondary | ICD-10-CM | POA: Diagnosis not present

## 2015-12-16 DIAGNOSIS — Z794 Long term (current) use of insulin: Secondary | ICD-10-CM | POA: Diagnosis not present

## 2015-12-16 DIAGNOSIS — E119 Type 2 diabetes mellitus without complications: Secondary | ICD-10-CM | POA: Diagnosis not present

## 2015-12-19 DIAGNOSIS — Z794 Long term (current) use of insulin: Secondary | ICD-10-CM | POA: Diagnosis not present

## 2015-12-19 DIAGNOSIS — T8579XD Infection and inflammatory reaction due to other internal prosthetic devices, implants and grafts, subsequent encounter: Secondary | ICD-10-CM | POA: Diagnosis not present

## 2015-12-19 DIAGNOSIS — E119 Type 2 diabetes mellitus without complications: Secondary | ICD-10-CM | POA: Diagnosis not present

## 2015-12-19 DIAGNOSIS — Z6836 Body mass index (BMI) 36.0-36.9, adult: Secondary | ICD-10-CM | POA: Diagnosis not present

## 2015-12-19 DIAGNOSIS — N261 Atrophy of kidney (terminal): Secondary | ICD-10-CM | POA: Diagnosis not present

## 2015-12-19 DIAGNOSIS — E669 Obesity, unspecified: Secondary | ICD-10-CM | POA: Diagnosis not present

## 2015-12-20 ENCOUNTER — Telehealth: Payer: Self-pay | Admitting: Internal Medicine

## 2015-12-20 ENCOUNTER — Other Ambulatory Visit: Payer: Self-pay | Admitting: *Deleted

## 2015-12-20 DIAGNOSIS — IMO0001 Reserved for inherently not codable concepts without codable children: Secondary | ICD-10-CM

## 2015-12-20 DIAGNOSIS — E1165 Type 2 diabetes mellitus with hyperglycemia: Principal | ICD-10-CM

## 2015-12-20 MED ORDER — INSULIN GLARGINE 100 UNIT/ML SOLOSTAR PEN: 80.0000 [IU] | PEN_INJECTOR | Freq: Every day | SUBCUTANEOUS | Status: DC

## 2015-12-20 NOTE — Telephone Encounter (Signed)
Medical Assistant left message on patient's home and cell voicemail. Voicemail states to give a call back to [place] with [facility] at [phone].   !!!Please inform patient of a verbal order being placed with [facility] pharmacy for refills on her Lantus Pen!!!

## 2015-12-20 NOTE — Telephone Encounter (Signed)
Pt. Came in stating that she has being getting her Insulin Glargine (LANTUS SOLOSTAR) 100 UNIT/ML Solostar Pen for free through the [facility] program and pt. Has being asking for the refill since January. Pt. Was told that it was refilled today and she stated that she [facility] and was told it was not there. Please f/u with pt.

## 2015-12-20 NOTE — Telephone Encounter (Signed)
Patient was last seen in the office on 09/15/15. Patients pharmacy sent a request for Lantus. Patients medication has been refilled with 3 additional refills.

## 2015-12-21 DIAGNOSIS — T8579XD Infection and inflammatory reaction due to other internal prosthetic devices, implants and grafts, subsequent encounter: Secondary | ICD-10-CM | POA: Diagnosis not present

## 2015-12-21 DIAGNOSIS — N261 Atrophy of kidney (terminal): Secondary | ICD-10-CM | POA: Diagnosis not present

## 2015-12-21 DIAGNOSIS — E669 Obesity, unspecified: Secondary | ICD-10-CM | POA: Diagnosis not present

## 2015-12-21 DIAGNOSIS — E119 Type 2 diabetes mellitus without complications: Secondary | ICD-10-CM | POA: Diagnosis not present

## 2015-12-21 DIAGNOSIS — Z6836 Body mass index (BMI) 36.0-36.9, adult: Secondary | ICD-10-CM | POA: Diagnosis not present

## 2015-12-21 DIAGNOSIS — Z794 Long term (current) use of insulin: Secondary | ICD-10-CM | POA: Diagnosis not present

## 2015-12-23 DIAGNOSIS — N261 Atrophy of kidney (terminal): Secondary | ICD-10-CM | POA: Diagnosis not present

## 2015-12-23 DIAGNOSIS — Z6836 Body mass index (BMI) 36.0-36.9, adult: Secondary | ICD-10-CM | POA: Diagnosis not present

## 2015-12-23 DIAGNOSIS — Z794 Long term (current) use of insulin: Secondary | ICD-10-CM | POA: Diagnosis not present

## 2015-12-23 DIAGNOSIS — T8579XD Infection and inflammatory reaction due to other internal prosthetic devices, implants and grafts, subsequent encounter: Secondary | ICD-10-CM | POA: Diagnosis not present

## 2015-12-23 DIAGNOSIS — E669 Obesity, unspecified: Secondary | ICD-10-CM | POA: Diagnosis not present

## 2015-12-23 DIAGNOSIS — E119 Type 2 diabetes mellitus without complications: Secondary | ICD-10-CM | POA: Diagnosis not present

## 2015-12-26 DIAGNOSIS — N261 Atrophy of kidney (terminal): Secondary | ICD-10-CM | POA: Diagnosis not present

## 2015-12-26 DIAGNOSIS — E669 Obesity, unspecified: Secondary | ICD-10-CM | POA: Diagnosis not present

## 2015-12-26 DIAGNOSIS — Z794 Long term (current) use of insulin: Secondary | ICD-10-CM | POA: Diagnosis not present

## 2015-12-26 DIAGNOSIS — Z6836 Body mass index (BMI) 36.0-36.9, adult: Secondary | ICD-10-CM | POA: Diagnosis not present

## 2015-12-26 DIAGNOSIS — E119 Type 2 diabetes mellitus without complications: Secondary | ICD-10-CM | POA: Diagnosis not present

## 2015-12-26 DIAGNOSIS — T8579XD Infection and inflammatory reaction due to other internal prosthetic devices, implants and grafts, subsequent encounter: Secondary | ICD-10-CM | POA: Diagnosis not present

## 2015-12-28 DIAGNOSIS — E119 Type 2 diabetes mellitus without complications: Secondary | ICD-10-CM | POA: Diagnosis not present

## 2015-12-28 DIAGNOSIS — Z794 Long term (current) use of insulin: Secondary | ICD-10-CM | POA: Diagnosis not present

## 2015-12-28 DIAGNOSIS — T8579XD Infection and inflammatory reaction due to other internal prosthetic devices, implants and grafts, subsequent encounter: Secondary | ICD-10-CM | POA: Diagnosis not present

## 2015-12-28 DIAGNOSIS — N261 Atrophy of kidney (terminal): Secondary | ICD-10-CM | POA: Diagnosis not present

## 2015-12-28 DIAGNOSIS — E669 Obesity, unspecified: Secondary | ICD-10-CM | POA: Diagnosis not present

## 2015-12-28 DIAGNOSIS — Z6836 Body mass index (BMI) 36.0-36.9, adult: Secondary | ICD-10-CM | POA: Diagnosis not present

## 2015-12-30 DIAGNOSIS — N261 Atrophy of kidney (terminal): Secondary | ICD-10-CM | POA: Diagnosis not present

## 2015-12-30 DIAGNOSIS — E119 Type 2 diabetes mellitus without complications: Secondary | ICD-10-CM | POA: Diagnosis not present

## 2015-12-30 DIAGNOSIS — E669 Obesity, unspecified: Secondary | ICD-10-CM | POA: Diagnosis not present

## 2015-12-30 DIAGNOSIS — T8579XD Infection and inflammatory reaction due to other internal prosthetic devices, implants and grafts, subsequent encounter: Secondary | ICD-10-CM | POA: Diagnosis not present

## 2015-12-30 DIAGNOSIS — Z794 Long term (current) use of insulin: Secondary | ICD-10-CM | POA: Diagnosis not present

## 2015-12-30 DIAGNOSIS — Z6836 Body mass index (BMI) 36.0-36.9, adult: Secondary | ICD-10-CM | POA: Diagnosis not present

## 2016-01-02 DIAGNOSIS — N261 Atrophy of kidney (terminal): Secondary | ICD-10-CM | POA: Diagnosis not present

## 2016-01-02 DIAGNOSIS — Z6836 Body mass index (BMI) 36.0-36.9, adult: Secondary | ICD-10-CM | POA: Diagnosis not present

## 2016-01-02 DIAGNOSIS — E669 Obesity, unspecified: Secondary | ICD-10-CM | POA: Diagnosis not present

## 2016-01-02 DIAGNOSIS — Z794 Long term (current) use of insulin: Secondary | ICD-10-CM | POA: Diagnosis not present

## 2016-01-02 DIAGNOSIS — T8579XD Infection and inflammatory reaction due to other internal prosthetic devices, implants and grafts, subsequent encounter: Secondary | ICD-10-CM | POA: Diagnosis not present

## 2016-01-02 DIAGNOSIS — E119 Type 2 diabetes mellitus without complications: Secondary | ICD-10-CM | POA: Diagnosis not present

## 2016-01-04 DIAGNOSIS — N261 Atrophy of kidney (terminal): Secondary | ICD-10-CM | POA: Diagnosis not present

## 2016-01-04 DIAGNOSIS — E669 Obesity, unspecified: Secondary | ICD-10-CM | POA: Diagnosis not present

## 2016-01-04 DIAGNOSIS — T8579XD Infection and inflammatory reaction due to other internal prosthetic devices, implants and grafts, subsequent encounter: Secondary | ICD-10-CM | POA: Diagnosis not present

## 2016-01-04 DIAGNOSIS — E119 Type 2 diabetes mellitus without complications: Secondary | ICD-10-CM | POA: Diagnosis not present

## 2016-01-04 DIAGNOSIS — Z6836 Body mass index (BMI) 36.0-36.9, adult: Secondary | ICD-10-CM | POA: Diagnosis not present

## 2016-01-04 DIAGNOSIS — Z794 Long term (current) use of insulin: Secondary | ICD-10-CM | POA: Diagnosis not present

## 2016-01-06 DIAGNOSIS — E119 Type 2 diabetes mellitus without complications: Secondary | ICD-10-CM | POA: Diagnosis not present

## 2016-01-06 DIAGNOSIS — E669 Obesity, unspecified: Secondary | ICD-10-CM | POA: Diagnosis not present

## 2016-01-06 DIAGNOSIS — T8579XD Infection and inflammatory reaction due to other internal prosthetic devices, implants and grafts, subsequent encounter: Secondary | ICD-10-CM | POA: Diagnosis not present

## 2016-01-06 DIAGNOSIS — Z794 Long term (current) use of insulin: Secondary | ICD-10-CM | POA: Diagnosis not present

## 2016-01-06 DIAGNOSIS — Z6836 Body mass index (BMI) 36.0-36.9, adult: Secondary | ICD-10-CM | POA: Diagnosis not present

## 2016-01-06 DIAGNOSIS — N261 Atrophy of kidney (terminal): Secondary | ICD-10-CM | POA: Diagnosis not present

## 2016-01-09 DIAGNOSIS — N261 Atrophy of kidney (terminal): Secondary | ICD-10-CM | POA: Diagnosis not present

## 2016-01-09 DIAGNOSIS — Z6836 Body mass index (BMI) 36.0-36.9, adult: Secondary | ICD-10-CM | POA: Diagnosis not present

## 2016-01-09 DIAGNOSIS — Z794 Long term (current) use of insulin: Secondary | ICD-10-CM | POA: Diagnosis not present

## 2016-01-09 DIAGNOSIS — E119 Type 2 diabetes mellitus without complications: Secondary | ICD-10-CM | POA: Diagnosis not present

## 2016-01-09 DIAGNOSIS — E669 Obesity, unspecified: Secondary | ICD-10-CM | POA: Diagnosis not present

## 2016-01-09 DIAGNOSIS — T8579XD Infection and inflammatory reaction due to other internal prosthetic devices, implants and grafts, subsequent encounter: Secondary | ICD-10-CM | POA: Diagnosis not present

## 2016-01-11 DIAGNOSIS — E119 Type 2 diabetes mellitus without complications: Secondary | ICD-10-CM | POA: Diagnosis not present

## 2016-01-11 DIAGNOSIS — N261 Atrophy of kidney (terminal): Secondary | ICD-10-CM | POA: Diagnosis not present

## 2016-01-11 DIAGNOSIS — Z6836 Body mass index (BMI) 36.0-36.9, adult: Secondary | ICD-10-CM | POA: Diagnosis not present

## 2016-01-11 DIAGNOSIS — E669 Obesity, unspecified: Secondary | ICD-10-CM | POA: Diagnosis not present

## 2016-01-11 DIAGNOSIS — Z794 Long term (current) use of insulin: Secondary | ICD-10-CM | POA: Diagnosis not present

## 2016-01-11 DIAGNOSIS — T8579XD Infection and inflammatory reaction due to other internal prosthetic devices, implants and grafts, subsequent encounter: Secondary | ICD-10-CM | POA: Diagnosis not present

## 2016-01-13 DIAGNOSIS — Z6836 Body mass index (BMI) 36.0-36.9, adult: Secondary | ICD-10-CM | POA: Diagnosis not present

## 2016-01-13 DIAGNOSIS — E119 Type 2 diabetes mellitus without complications: Secondary | ICD-10-CM | POA: Diagnosis not present

## 2016-01-13 DIAGNOSIS — Z794 Long term (current) use of insulin: Secondary | ICD-10-CM | POA: Diagnosis not present

## 2016-01-13 DIAGNOSIS — E669 Obesity, unspecified: Secondary | ICD-10-CM | POA: Diagnosis not present

## 2016-01-13 DIAGNOSIS — T8579XD Infection and inflammatory reaction due to other internal prosthetic devices, implants and grafts, subsequent encounter: Secondary | ICD-10-CM | POA: Diagnosis not present

## 2016-01-13 DIAGNOSIS — N261 Atrophy of kidney (terminal): Secondary | ICD-10-CM | POA: Diagnosis not present

## 2016-01-27 MED FILL — ?FENOFIBRATE 145 MG TABLET: 145 | 30 days supply | Qty: 30 | Fill #4

## 2016-01-27 MED FILL — glipiZIDE 10 MG TABS: 10 | 30 days supply | Qty: 60 | Fill #5

## 2016-02-07 ENCOUNTER — Ambulatory Visit (INDEPENDENT_AMBULATORY_CARE_PROVIDER_SITE_OTHER): Payer: Medicare Other | Admitting: Physician Assistant

## 2016-02-07 VITALS — BP 153/77 | HR 72 | Temp 98.5°F | Resp 16 | Ht 66.0 in | Wt 235.0 lb

## 2016-02-07 DIAGNOSIS — B379 Candidiasis, unspecified: Secondary | ICD-10-CM

## 2016-02-07 DIAGNOSIS — N898 Other specified noninflammatory disorders of vagina: Secondary | ICD-10-CM

## 2016-02-07 DIAGNOSIS — Z124 Encounter for screening for malignant neoplasm of cervix: Secondary | ICD-10-CM | POA: Diagnosis not present

## 2016-02-07 LAB — POC MICROSCOPIC URINALYSIS (UMFC): Mucus: ABSENT

## 2016-02-07 LAB — POCT URINALYSIS DIP (MANUAL ENTRY)
Bilirubin, UA: NEGATIVE
Glucose, UA: 100 — AB
Ketones, POC UA: NEGATIVE
NITRITE UA: NEGATIVE
PH UA: 5
Protein Ur, POC: 30 — AB
SPEC GRAV UA: 1.02
UROBILINOGEN UA: 0.2

## 2016-02-07 LAB — POCT WET + KOH PREP: TRICH BY WET PREP: ABSENT

## 2016-02-07 MED ORDER — FLUCONAZOLE 150 MG PO TABS: 150.0000 mg | ORAL_TABLET | Freq: Once | ORAL | Status: DC

## 2016-02-07 MED FILL — FLUCONAZOLE 150 MG TABLET: 150 | 7 days supply | Qty: 2 | Fill #0

## 2016-02-07 NOTE — Patient Instructions (Addendum)
IF you received an x-ray today, you will receive an invoice from [facility]. Please contact [facility] at [phone] with questions or concerns regarding your invoice.   IF you received labwork today, you will receive an invoice from [employer]. Please contact [facility] at [phone] with questions or concerns regarding your invoice.   Our billing staff will not be able to assist you with questions regarding bills from these companies.  You will be contacted with the lab results as soon as they are available. The fastest way to get your results is to activate your My Chart account. Instructions are located on the last page of this paperwork. If you have not heard from [place] regarding the results in 2 weeks, please contact this office.    I would like you to hydrate well with water. Please take the medication as prescribed.  Take one diflucan now.  If your symptoms continue after a few days, I would like you to take the second tablet.  We will then start REPLENS which you can apply 3 times per week. I will have your lab results within the next 7 days.  Monilial Vaginitis Vaginitis in a soreness, swelling and redness (inflammation) of the vagina and vulva. Monilial vaginitis is not a sexually transmitted infection. CAUSES  Yeast vaginitis is caused by yeast (candida) that is normally found in your vagina. With a yeast infection, the candida has overgrown in number to a point that upsets the chemical balance. SYMPTOMS   White, thick vaginal discharge.  Swelling, itching, redness and irritation of the vagina and possibly the lips of the vagina (vulva).  Burning or painful urination.  Painful intercourse. DIAGNOSIS  Things that may contribute to monilial vaginitis are:  Postmenopausal and virginal states.  Pregnancy.  Infections.  Being tired, sick or stressed, especially if you had monilial vaginitis in the past.  Diabetes. Good  control will help lower the chance.  Birth control pills.  Tight fitting garments.  Using bubble bath, feminine sprays, douches or deodorant tampons.  Taking certain medications that kill germs (antibiotics).  Sporadic recurrence can occur if you become ill. TREATMENT  Your caregiver will give you medication.  There are several kinds of anti monilial vaginal creams and suppositories specific for monilial vaginitis. For recurrent yeast infections, use a suppository or cream in the vagina 2 times a week, or as directed.  Anti-monilial or steroid cream for the itching or irritation of the vulva may also be used. Get your caregiver's permission.  Painting the vagina with methylene blue solution may help if the monilial cream does not work.  Eating yogurt may help prevent monilial vaginitis. HOME CARE INSTRUCTIONS   Finish all medication as prescribed.  Do not have sex until treatment is completed or after your caregiver tells you it is okay.  Take warm sitz baths.  Do not douche.  Do not use tampons, especially scented ones.  Wear cotton underwear.  Avoid tight pants and panty hose.  Tell your sexual partner that you have a yeast infection. They should go to their caregiver if they have symptoms such as mild rash or itching.  Your sexual partner should be treated as well if your infection is difficult to eliminate.  Practice safer sex. Use condoms.  Some vaginal medications cause latex condoms to fail. Vaginal medications that harm condoms are:  Cleocin cream.  Butoconazole (Femstat).  Terconazole (Terazol) vaginal suppository.  Miconazole (Monistat) (may be purchased over the counter). [place]  CARE IF:   You have a temperature by mouth above 102 F (38.9 C).  The infection is getting worse after 2 days of treatment.  The infection is not getting better after 3 days of treatment.  You develop blisters in or around your vagina.  You develop vaginal  bleeding, and it is not your menstrual period.  You have pain when you urinate.  You develop intestinal problems.  You have pain with sexual intercourse.   This information is not intended to replace advice given to you by your health care provider. Make sure you discuss any questions you have with your health care provider.   Document Released: 08/08/2005 Document Revised: 01/21/2012 Document Reviewed: 05/02/2015 Elsevier Interactive Patient Education [employer].

## 2016-02-07 NOTE — Progress Notes (Signed)
[facility] [address], [place] [address] [phone]  Date:  02/07/2016   Name:  Catherine Fowler   DOB:  17-Feb-1951   MRN:  [id number]  PCP:  Angelica Chessman, MD   Chief Complaint  Patient presents with  . Vaginitis  . Nevus    History of Present Illness:  Catherine Fowler is a 65 y.o. female patient who presents to [facility] for cc of vaginal irritation    1 week ago, pain once per day.  This is usually at night.  She has achiness, but sharp, that is in her vaginal/clitoral areal.  She states that she has no vaginal discharge, bleeding, or pruritus.     She has not had a pap in the last 3 years.  She is not having any change in her vaginal changes.  She has a hx of vaginal discharge.  She has no hx of dysuria, hematuria, or frequency.   She has glucose monitoring of 140-160.  206 was the blood glucose.    Patient Active Problem List   Diagnosis Date Noted  . Depression (emotion) 07/21/2015  . Diabetes with skin complication 45/36/4680  . Type 2 diabetes mellitus with hyperglycemia ([place]) 04/21/2015  . Vaginitis and vulvovaginitis 04/07/2015  . Other specified diabetes mellitus without complications ([place]) 32/10/2481  . Type 2 diabetes mellitus without complication ([place]) 50/01/7047  . History of colon cancer, no staging 09/09/2014  . Dyslipidemia 06/07/2014  . Essential hypertension 06/07/2014  . Colon cancer ([place]) 01/12/2014  . Preventative health care 09/04/2013  . Anemia, iron deficiency 07/29/2013  . DM (diabetes mellitus), type 2, uncontrolled ([place]) 09/24/2011  . HTN (hypertension) 09/24/2011    Past Medical History  Diagnosis Date  . Diabetes mellitus   . Chronic kidney disease   . Colon cancer ([place]) 09/18/13    Past Surgical History  Procedure Laterality Date  . Abdominal surgery    . Appendectomy    . Cholecystectomy    . Tubal ligation    . Breast surgery  2/97    breast reduction   . Gastric bypass  10/16/2004  . Rhinoplasty    .  Ventral hernia repair  06/17/2012    Procedure: HERNIA REPAIR VENTRAL ADULT;  Surgeon: Adin Hector, MD;  Location: [facility];  Service: General;  Laterality: N/A;  . Hernia repair      Social History  Substance Use Topics  . Smoking status: Never Smoker   . Smokeless tobacco: Never Used  . Alcohol Use: No    Family History  Problem Relation Age of Onset  . Diabetes type II Father   . Diabetes type II Sister   . Diabetes type II Other   . Diabetes type II Maternal Aunt     Allergies  Allergen Reactions  . Pravastatin Sodium Itching and Rash  . Simvastatin Rash    Medication list has been reviewed and updated.  Current Outpatient Prescriptions on File Prior to Visit  Medication Sig Dispense Refill  . fenofibrate (TRICOR) 145 MG tablet Take 1 tablet (145 mg total) by mouth daily. 90 tablet 3  . glipiZIDE (GLUCOTROL) 10 MG tablet Take 1 tablet (10 mg total) by mouth 2 (two) times daily before a meal. 180 tablet 3  . glucose monitoring kit (FREESTYLE) monitoring kit 1 each by Does not apply route 4 (four) times daily - after meals and at bedtime. 1 month Diabetic Testing Supplies for QAC-QHS accuchecks. Any brand okay 1 each  1  . Insulin Glargine (LANTUS SOLOSTAR) 100 UNIT/ML Solostar Pen Inject 80 Units into the skin at bedtime. 5 pen 3  . Menthol-Zinc Oxide (CALMOSEPTINE) 0.44-20.625 % OINT Apply to wound 3 times daily (Patient not taking: Reported on 08/18/2015) 2 Tube 3  . nystatin cream (MYCOSTATIN) Apply 1 application topically 2 (two) times daily. (Patient not taking: Reported on 02/07/2016) 30 g 3   No current facility-administered medications on file prior to visit.    ROS ROS otherwise unremarkable unless listed above.    Physical Examination: BP 153/77 mmHg  Pulse 72  Temp(Src) 98.5 F (36.9 C)  Resp 16  Ht _0  (1.676 m)  Wt 235 lb (106.595 kg)  BMI 37.95 kg/m2 Ideal Body Weight: Weight in (lb) to have BMI = 25: 154.6  Physical Exam  Constitutional: She is  oriented to person, place, and time. She appears well-developed and well-nourished. No distress.  HENT:  Head: Normocephalic and atraumatic.  Right Ear: External ear normal.  Left Ear: External ear normal.  Eyes: Conjunctivae and EOM are normal. Pupils are equal, round, and reactive to light.  Cardiovascular: Normal rate.   Pulmonary/Chest: Effort normal. No respiratory distress.  Genitourinary: Pelvic exam was performed with patient supine. There is rash (redness and swelling at the labia majora) on the right labia. There is no tenderness on the right labia. There is rash (redness and swelling at the labia majora) on the left labia. There is no tenderness on the left labia. Cervix exhibits no motion tenderness, no discharge and no friability. Right adnexum displays no mass. Left adnexum displays no mass. Vaginal discharge found.  Atrophic vaginitis  Neurological: She is alert and oriented to person, place, and time.  Skin: She is not diaphoretic.  Psychiatric: She has a normal mood and affect. Her behavior is normal.   Results for orders placed or performed in visit on 02/07/16  POCT Wet + KOH Prep  Result Value Ref Range   Yeast by KOH Present Present, Absent   Yeast by wet prep Present Present, Absent   WBC by wet prep Few None, Few, Too numerous to count   Clue Cells Wet Prep HPF POC None None, Too numerous to count   Trich by wet prep Absent Present, Absent   Bacteria Wet Prep HPF POC Few None, Few, Too numerous to count   Epithelial Cells By [employer] Pref ([facility]) Moderate (A) None, Few, Too numerous to count   RBC,UR,HPF,POC None None RBC/hpf  POCT urinalysis dipstick  Result Value Ref Range   Color, UA yellow yellow   Clarity, UA cloudy (A) clear   Glucose, UA =100 (A) negative   Bilirubin, UA negative negative   Ketones, POC UA negative negative   Spec Grav, UA 1.020    Blood, UA large (A) negative   pH, UA 5.0    Protein Ur, POC =30 (A) negative   Urobilinogen, UA 0.2     Nitrite, UA Negative Negative   Leukocytes, UA small (1+) (A) Negative  POCT Microscopic Urinalysis ([facility])  Result Value Ref Range   WBC,UR,HPF,POC Many (A) None WBC/hpf   RBC,UR,HPF,POC Many (A) None RBC/hpf   Bacteria Few (A) None, Too numerous to count   Mucus Absent Absent   Epithelial Cells, UR Per Microscopy Few (A) None, Too numerous to count cells/hpf     Assessment and Plan: Catherine Fowler is a 65 y.o. female who is here today for cc of vaginal irritation. -set referral for dermatologist for removal  of nevus -starting diflucan at this time.  She will also use the nystatin that she has in this area.  Also advise that she use replens. -hematuria secondary to pap.  Yeast infection - Plan: fluconazole (DIFLUCAN) 150 MG tablet, POCT urinalysis dipstick, POCT Microscopic Urinalysis ([facility])  Vaginal discharge - Plan: POCT Wet + KOH Prep, Pap IG w/ reflex to HPV when ASC-U, fluconazole (DIFLUCAN) 150 MG tablet, POCT urinalysis dipstick, POCT Microscopic Urinalysis ([facility])  Papanicolaou smear for cervical cancer screening - Plan: Pap IG w/ reflex to HPV when ASC-U   Ivar Drape, PA-C [facility] 02/07/2016 11:24 AM

## 2016-02-08 LAB — PAP IG W/ RFLX HPV ASCU

## 2016-02-13 MED FILL — glipiZIDE 10 MG TABS: 10 | 30 days supply | Qty: 60 | Fill #4

## 2016-02-14 ENCOUNTER — Telehealth: Payer: Self-pay

## 2016-02-14 DIAGNOSIS — N898 Other specified noninflammatory disorders of vagina: Secondary | ICD-10-CM

## 2016-02-14 DIAGNOSIS — B379 Candidiasis, unspecified: Secondary | ICD-10-CM

## 2016-02-14 MED ORDER — FLUCONAZOLE 150 MG PO TABS: 150.0000 mg | ORAL_TABLET | Freq: Once | ORAL | Status: DC

## 2016-02-14 MED FILL — FLUCONAZOLE 150 MG TABLET: 150 | 1 days supply | Qty: 1 | Fill #0

## 2016-02-14 NOTE — Telephone Encounter (Signed)
Patient is calling because she was prescribed the 2 part diflucan and dropped the second part down the sink. Patient wants to know if we can call the pharmacy so she can get another on.  Pharmacy is [facility]. Pharmacy phone: [phone] Patient phone: [phone]

## 2016-02-14 NOTE — Telephone Encounter (Signed)
Sent in replacement tab and called pt who thought that she needed to start over because she did not take the 2nd one within 3-4 days of the first. I advised pt that many providers Rx it to take a 2nd in a week if needed, so she should be fine, and doesn't need to start over.

## 2016-02-21 MED FILL — ?FENOFIBRATE 145 MG TABLET: 145 | 30 days supply | Qty: 30 | Fill #5

## 2016-03-21 MED FILL — ?FENOFIBRATE 145 MG TABLET: 145 | 30 days supply | Qty: 30 | Fill #6

## 2016-03-29 ENCOUNTER — Other Ambulatory Visit: Payer: Self-pay | Admitting: Internal Medicine

## 2016-03-29 NOTE — Telephone Encounter (Signed)
Patient came in requesting a new refill for insulin. Patient is apart of the MAP program at the [facility].

## 2016-04-02 MED FILL — glipiZIDE 10 MG TABS: 10 | 30 days supply | Qty: 60 | Fill #5

## 2016-04-26 ENCOUNTER — Other Ambulatory Visit: Payer: Self-pay | Admitting: *Deleted

## 2016-04-26 DIAGNOSIS — E1165 Type 2 diabetes mellitus with hyperglycemia: Principal | ICD-10-CM

## 2016-04-26 DIAGNOSIS — IMO0001 Reserved for inherently not codable concepts without codable children: Secondary | ICD-10-CM

## 2016-04-26 MED ORDER — INSULIN GLARGINE 100 UNIT/ML SOLOSTAR PEN: 80.0000 [IU] | PEN_INJECTOR | Freq: Every day | SUBCUTANEOUS | Status: DC

## 2016-04-26 MED ORDER — MENTHOL-ZINC OXIDE 0.44-20.625 % EX OINT: TOPICAL_OINTMENT | CUTANEOUS | Status: DC

## 2016-04-26 NOTE — Telephone Encounter (Signed)
MAPS PROGRAM 

## 2016-04-27 ENCOUNTER — Telehealth: Payer: Self-pay | Admitting: Internal Medicine

## 2016-04-27 NOTE — Telephone Encounter (Signed)
Pt called regarding her medication refill for insulin being sent to the Health Department (MAP) Pt stated that she had been trying to get a refill since Jan.  Pt stated that she spoke to the nurse and pharmacy here at [facility] on 6/15 and they said it would be faxed over Pt called today saying her prescription still had not been faxed Pt was yelling and stated that we were trying to kill her by not filling her insulin  I told the pt that we were doing everything we could and she laughed and said that's sad. Neither her nurse or PCP are in today and we searched for the prescription to fax over but could not find it. I told the pt that the nurse would call the pharmacy at the health department and try to give a verbal authorization so she could receive her insulin Pt was still upset and expressed negative opinions on [facility] saying that we never get prescriptions correct and that nobody wants to come to our practice

## 2016-05-01 MED FILL — glipiZIDE 10 MG TABS: 10 | 30 days supply | Qty: 60 | Fill #6

## 2016-05-03 NOTE — Telephone Encounter (Signed)
Prescription was mailed to the pharmacy due to fax failing with 3 attempts.

## 2016-05-04 MED FILL — FENOFIBRATE 145 MG TABLET: 145 | 30 days supply | Qty: 30 | Fill #7

## 2016-05-30 MED FILL — glipiZIDE 10 MG TABS: 10 | 30 days supply | Qty: 60 | Fill #7

## 2016-06-04 MED FILL — ?FENOFIBRATE 145 MG TABLET: 145 | 30 days supply | Qty: 30 | Fill #8

## 2016-07-02 ENCOUNTER — Other Ambulatory Visit: Payer: Self-pay | Admitting: Pharmacist

## 2016-07-02 DIAGNOSIS — E1165 Type 2 diabetes mellitus with hyperglycemia: Principal | ICD-10-CM

## 2016-07-02 DIAGNOSIS — IMO0001 Reserved for inherently not codable concepts without codable children: Secondary | ICD-10-CM

## 2016-07-02 MED ORDER — INSULIN GLARGINE 100 UNIT/ML SOLOSTAR PEN: 80.0000 [IU] | PEN_INJECTOR | Freq: Every day | SUBCUTANEOUS | 0 refills | Status: DC

## 2016-07-03 MED FILL — ?FENOFIBRATE 145 MG TABLET: 145 | 30 days supply | Qty: 30 | Fill #9

## 2016-07-09 MED FILL — glipiZIDE 10 MG TABS: 10 | 30 days supply | Qty: 60 | Fill #8

## 2016-07-26 ENCOUNTER — Ambulatory Visit: Payer: Medicare Other | Attending: Internal Medicine | Admitting: Internal Medicine

## 2016-07-26 ENCOUNTER — Encounter: Payer: Self-pay | Admitting: Internal Medicine

## 2016-07-26 VITALS — BP 137/70 | HR 72 | Temp 98.2°F | Resp 18 | Ht 67.0 in | Wt 239.8 lb

## 2016-07-26 DIAGNOSIS — Z794 Long term (current) use of insulin: Secondary | ICD-10-CM | POA: Diagnosis not present

## 2016-07-26 DIAGNOSIS — IMO0001 Reserved for inherently not codable concepts without codable children: Secondary | ICD-10-CM

## 2016-07-26 DIAGNOSIS — N189 Chronic kidney disease, unspecified: Secondary | ICD-10-CM | POA: Insufficient documentation

## 2016-07-26 DIAGNOSIS — E1122 Type 2 diabetes mellitus with diabetic chronic kidney disease: Secondary | ICD-10-CM | POA: Diagnosis not present

## 2016-07-26 DIAGNOSIS — I129 Hypertensive chronic kidney disease with stage 1 through stage 4 chronic kidney disease, or unspecified chronic kidney disease: Secondary | ICD-10-CM | POA: Diagnosis not present

## 2016-07-26 DIAGNOSIS — E119 Type 2 diabetes mellitus without complications: Secondary | ICD-10-CM | POA: Diagnosis not present

## 2016-07-26 DIAGNOSIS — F329 Major depressive disorder, single episode, unspecified: Secondary | ICD-10-CM | POA: Diagnosis not present

## 2016-07-26 DIAGNOSIS — Z0000 Encounter for general adult medical examination without abnormal findings: Secondary | ICD-10-CM

## 2016-07-26 DIAGNOSIS — E785 Hyperlipidemia, unspecified: Secondary | ICD-10-CM | POA: Insufficient documentation

## 2016-07-26 DIAGNOSIS — Z85038 Personal history of other malignant neoplasm of large intestine: Secondary | ICD-10-CM | POA: Diagnosis not present

## 2016-07-26 DIAGNOSIS — E1165 Type 2 diabetes mellitus with hyperglycemia: Secondary | ICD-10-CM | POA: Diagnosis not present

## 2016-07-26 LAB — GLUCOSE, POCT (MANUAL RESULT ENTRY): POC Glucose: 192 mg/dl — AB (ref 70–99)

## 2016-07-26 LAB — POCT GLYCOSYLATED HEMOGLOBIN (HGB A1C): Hemoglobin A1C: 12.1

## 2016-07-26 MED ORDER — GLIPIZIDE 10 MG PO TABS: 10.0000 mg | ORAL_TABLET | Freq: Two times a day (BID) | ORAL | 3 refills | Status: DC

## 2016-07-26 MED ORDER — INSULIN GLARGINE 100 UNIT/ML SOLOSTAR PEN: 80.0000 [IU] | PEN_INJECTOR | Freq: Every day | SUBCUTANEOUS | 3 refills | Status: DC

## 2016-07-26 MED ORDER — FENOFIBRATE 145 MG PO TABS: 145.0000 mg | ORAL_TABLET | Freq: Every day | ORAL | 3 refills | Status: DC

## 2016-07-26 MED FILL — FENOFIBRATE 145 MG TABLET: 145 | 30 days supply | Qty: 30 | Fill #0

## 2016-07-26 NOTE — Progress Notes (Signed)
Patient is here for FU DM  Patient denies pain at this time.  Patient has taken medication today and patient has eaten today.  Patient would like the flu vaccine today. Patient tolerated flu vaccine well today.  Patient request refills on Glipizide and Insulin.

## 2016-07-26 NOTE — Progress Notes (Signed)
Catherine Fowler, is a 65 y.o. female  HQI:[id number]  XLK:[id number]  DOB - [birth year]  Chief Complaint  Patient presents with  . Diabetes      Subjective:   Catherine Fowler is a 66 y.o. female with history of hypertension, dyslipidemia, major depression and type 2 diabetes mellitus uncontrolled despite 80 units of Lantus insulin daily at bedtime and glipizide 10 mg tablet by mouth twice a day.here today for a follow up visit of DM and HTN and medication refill. Patient has a little difficulty getting her medication refilled through the MAP program, she wants all that straightened out today. She has ran out of her insulin for a while. Her blood sugar has not been controlled as a result. She however feels really good now, depression is over since having her surgery and wound is completely healed. She is more motivated than ever. She also plan to take all her medications as prescribed, as well as diet and exercise. Patient has No headache, No chest pain, No abdominal pain - No Nausea, No new weakness tingling or numbness, No Cough - SOB.  No problems updated.  ALLERGIES: Allergies  Allergen Reactions  . Pravastatin Sodium Itching and Rash  . Simvastatin Rash    PAST MEDICAL HISTORY: Past Medical History:  Diagnosis Date  . Chronic kidney disease   . Colon cancer ([place]) 09/18/13  . Diabetes mellitus     MEDICATIONS AT HOME: Prior to Admission medications   Medication Sig Start Date End Date Taking? Authorizing Provider  fenofibrate (TRICOR) 145 MG tablet Take 1 tablet (145 mg total) by mouth daily. 07/26/16  Yes Saray Capasso Essie Christine, MD  glipiZIDE (GLUCOTROL) 10 MG tablet Take 1 tablet (10 mg total) by mouth 2 (two) times daily before a meal. 07/26/16  Yes Kailen Hinkle E Doreene Burke, MD  glucose monitoring kit (FREESTYLE) monitoring kit 1 each by Does not apply route 4 (four) times daily - after meals and at bedtime. 1 month Diabetic Testing Supplies for QAC-QHS accuchecks. Any brand okay 08/14/13   Yes Thurnell Lose, MD  Insulin Glargine (LANTUS SOLOSTAR) 100 UNIT/ML Solostar Pen Inject 80 Units into the skin at bedtime. 07/26/16  Yes Tresa Garter, MD     Objective:   Vitals:   07/26/16 0934  BP: 137/70  Pulse: 72  Resp: 18  Temp: 98.2 F (36.8 C)  TempSrc: Oral  SpO2: 98%  Weight: 239 lb 12.8 oz (108.8 kg)  Height: '5\' 7"'  (1.702 m)   Exam General appearance : Awake, alert, not in any distress. Speech Clear. Not toxic looking, obese HEENT: Atraumatic and Normocephalic, pupils equally reactive to light and accomodation Neck: Supple, no JVD. No cervical lymphadenopathy.  Chest: Good air entry bilaterally, no added sounds  CVS: S1 S2 regular, no murmurs.  Abdomen: Bowel sounds present, Non tender and not distended with no gaurding, rigidity or rebound. Extremities: B/L Lower Ext shows no edema, both legs are warm to touch Neurology: Awake alert, and oriented X 3, CN II-XII intact, Non focal Skin: No Rash  Data Review Lab Results  Component Value Date   HGBA1C 12.1 07/26/2016   HGBA1C 9.30 09/15/2015   HGBA1C 9.50 08/18/2015     Assessment & Plan   1. Type 2 diabetes mellitus without complication, unspecified long term insulin use status (HCC)  - Glucose (CBG) - POCT A1C has gone up to 12.1% possibly because of medication nonadherent Vs unavailable meds - Microalbumin/Creatinine Ratio, Urine Refill - glipiZIDE (GLUCOTROL) 10 MG tablet; Take 1  tablet (10 mg total) by mouth 2 (two) times daily before a meal.  Dispense: 180 tablet; Refill: 3  Aim for 30 minutes of exercise most days. Rethink what you drink. Water is great! Aim for 2-3 Carb Choices per meal (30-45 grams) +/- 1 either way  Aim for 0-15 Carbs per snack if hungry  Include protein in moderation with your meals and snacks  Consider reading food labels for Total Carbohydrate and Fat Grams of foods  Consider checking BG at alternate times per day  Continue taking medication as directed Be  mindful about how much sugar you are adding to beverages and other foods. Fruit Punch - find one with no sugar  Measure and decrease portions of carbohydrate foods  Make your plate and don't go back for seconds  2. Healthcare maintenance  - Flu Vaccine QUAD 36+ mos PF IM (Fluarix & Fluzone Quad PF)  3. Dyslipidemia  - fenofibrate (TRICOR) 145 MG tablet; Take 1 tablet (145 mg total) by mouth daily.  Dispense: 90 tablet; Refill: 3  To address this please limit saturated fat to no more than 7% of your calories, limit cholesterol to 200 mg/day, increase fiber and exercise as tolerated. If needed we may add another cholesterol lowering medication to your regimen.   4. Uncontrolled diabetes mellitus type 2 without complications, unspecified long term insulin use status (HCC) Refill - Insulin Glargine (LANTUS SOLOSTAR) 100 UNIT/ML Solostar Pen; Inject 80 Units into the skin at bedtime.  Dispense: 21 pen; Refill: 3  - Please return in 2 weeks with blood sugar log for review and medication management with CPP. - Patient will possibly benefit from pre-meal insulin regimen  Patient have been counseled extensively about nutrition and exercise  Return in about 2 weeks (around 08/09/2016) for CPP [facility] for BP/CBG and DM management.  The patient was given clear instructions to go to ER or return to medical center if symptoms don't improve, worsen or new problems develop. The patient verbalized understanding. The patient was told to call to get lab results if they haven't heard anything in the next week.   This note has been created with Surveyor, quantity. Any transcriptional errors are unintentional.    Angelica Chessman, MD, [place], Karilyn Cota, [place] and [facility], [place]   07/26/2016, 10:24 AM

## 2016-07-26 NOTE — Patient Instructions (Signed)
Diabetes and Exercise Exercising regularly is important. It is not just about losing weight. It has many health benefits, such as:  Improving your overall fitness, flexibility, and endurance.  Increasing your bone density.  Helping with weight control.  Decreasing your body fat.  Increasing your muscle strength.  Reducing stress and tension.  Improving your overall health. People with diabetes who exercise gain additional benefits because exercise:  Reduces appetite.  Improves the body's use of blood sugar (glucose).  Helps lower or control blood glucose.  Decreases blood pressure.  Helps control blood lipids (such as cholesterol and triglycerides).  Improves the body's use of the hormone insulin by:  Increasing the body's insulin sensitivity.  Reducing the body's insulin needs.  Decreases the risk for heart disease because exercising:  Lowers cholesterol and triglycerides levels.  Increases the levels of good cholesterol (such as high-density lipoproteins [HDL]) in the body.  Lowers blood glucose levels. YOUR ACTIVITY PLAN  Choose an activity that you enjoy, and set realistic goals. To exercise safely, you should begin practicing any new physical activity slowly, and gradually increase the intensity of the exercise over time. Your health care provider or diabetes educator can help create an activity plan that works for you. General recommendations include:  Encouraging children to engage in at least 60 minutes of physical activity each day.  Stretching and performing strength training exercises, such as yoga or weight lifting, at least 2 times per week.  Performing a total of at least 150 minutes of moderate-intensity exercise each week, such as brisk walking or water aerobics.  Exercising at least 3 days per week, making sure you allow no more than 2 consecutive days to pass without exercising.  Avoiding long periods of inactivity (90 minutes or more). When you  have to spend an extended period of time sitting down, take frequent breaks to walk or stretch. RECOMMENDATIONS FOR EXERCISING WITH TYPE 1 OR TYPE 2 DIABETES   Check your blood glucose before exercising. If blood glucose levels are greater than 240 mg/dL, check for urine ketones. Do not exercise if ketones are present.  Avoid injecting insulin into areas of the body that are going to be exercised. For example, avoid injecting insulin into:  The arms when playing tennis.  The legs when jogging.  Keep a record of:  Food intake before and after you exercise.  Expected peak times of insulin action.  Blood glucose levels before and after you exercise.  The type and amount of exercise you have done.  Review your records with your health care provider. Your health care provider will help you to develop guidelines for adjusting food intake and insulin amounts before and after exercising.  If you take insulin or oral hypoglycemic agents, watch for signs and symptoms of hypoglycemia. They include:  Dizziness.  Shaking.  Sweating.  Chills.  Confusion.  Drink plenty of water while you exercise to prevent dehydration or heat stroke. Body water is lost during exercise and must be replaced.  Talk to your health care provider before starting an exercise program to make sure it is safe for you. Remember, almost any type of activity is better than none.   This information is not intended to replace advice given to you by your health care provider. Make sure you discuss any questions you have with your health care provider.   Document Released: 01/19/2004 Document Revised: 03/15/2015 Document Reviewed: 04/07/2013 Elsevier Interactive Patient Education 2016 [place]. Diabetes and Foot Care Diabetes may cause you  to have problems because of poor blood supply (circulation) to your feet and legs. This may cause the skin on your feet to become thinner, break easier, and heal more slowly.  Your skin may become dry, and the skin may peel and crack. You may also have nerve damage in your legs and feet causing decreased feeling in them. You may not notice minor injuries to your feet that could lead to infections or more serious problems. Taking care of your feet is one of the most important things you can do for yourself.  HOME CARE INSTRUCTIONS  Wear shoes at all times, even in the house. Do not go barefoot. Bare feet are easily injured.  Check your feet daily for blisters, cuts, and redness. If you cannot see the bottom of your feet, use a mirror or ask someone for help.  Wash your feet with warm water (do not use hot water) and mild soap. Then pat your feet and the areas between your toes until they are completely dry. Do not soak your feet as this can dry your skin.  Apply a moisturizing lotion or petroleum jelly (that does not contain alcohol and is unscented) to the skin on your feet and to dry, brittle toenails. Do not apply lotion between your toes.  Trim your toenails straight across. Do not dig under them or around the cuticle. File the edges of your nails with an emery board or nail file.  Do not cut corns or calluses or try to remove them with medicine.  Wear clean socks or stockings every day. Make sure they are not too tight. Do not wear knee-high stockings since they may decrease blood flow to your legs.  Wear shoes that fit properly and have enough cushioning. To break in new shoes, wear them for just a few hours a day. This prevents you from injuring your feet. Always look in your shoes before you put them on to be sure there are no objects inside.  Do not cross your legs. This may decrease the blood flow to your feet.  If you find a minor scrape, cut, or break in the skin on your feet, keep it and the skin around it clean and dry. These areas may be cleansed with mild soap and water. Do not cleanse the area with peroxide, alcohol, or iodine.  When you remove an  adhesive bandage, be sure not to damage the skin around it.  If you have a wound, look at it several times a day to make sure it is healing.  Do not use heating pads or hot water bottles. They may burn your skin. If you have lost feeling in your feet or legs, you may not know it is happening until it is too late.  Make sure your health care provider performs a complete foot exam at least annually or more often if you have foot problems. Report any cuts, sores, or bruises to your health care provider immediately. SEEK MEDICAL CARE IF:   You have an injury that is not healing.  You have cuts or breaks in the skin.  You have an ingrown nail.  You notice redness on your legs or feet.  You feel burning or tingling in your legs or feet.  You have pain or cramps in your legs and feet.  Your legs or feet are numb.  Your feet always feel cold. SEEK IMMEDIATE MEDICAL CARE IF:   There is increasing redness, swelling, or pain in or around a   wound.  There is a red line that goes up your leg.  Pus is coming from a wound.  You develop a fever or as directed by your health care provider.  You notice a bad smell coming from an ulcer or wound.   This information is not intended to replace advice given to you by your health care provider. Make sure you discuss any questions you have with your health care provider.   Document Released: 10/26/2000 Document Revised: 07/01/2013 Document Reviewed: 04/07/2013 Elsevier Interactive Patient Education 2016 [employer] Basic Carbohydrate Counting for Diabetes Mellitus Carbohydrate counting is a method for keeping track of the amount of carbohydrates you eat. Eating carbohydrates naturally increases the level of sugar (glucose) in your blood, so it is important for you to know the amount that is okay for you to have in every meal. Carbohydrate counting helps keep the level of glucose in your blood within normal limits. The amount of carbohydrates  allowed is different for every person. A dietitian can help you calculate the amount that is right for you. Once you know the amount of carbohydrates you can have, you can count the carbohydrates in the foods you want to eat. Carbohydrates are found in the following foods:  Grains, such as breads and cereals.  Dried beans and soy products.  Starchy vegetables, such as potatoes, peas, and corn.  Fruit and fruit juices.  Milk and yogurt.  Sweets and snack foods, such as cake, cookies, candy, chips, soft drinks, and fruit drinks. CARBOHYDRATE COUNTING There are two ways to count the carbohydrates in your food. You can use either of the methods or a combination of both. Reading the "Nutrition Facts" on Packaged Food The "Nutrition Facts" is an area that is included on the labels of almost all packaged food and beverages in the [place]. It includes the serving size of that food or beverage and information about the nutrients in each serving of the food, including the grams (g) of carbohydrate per serving.  Decide the number of servings of this food or beverage that you will be able to eat or drink. Multiply that number of servings by the number of grams of carbohydrate that is listed on the label for that serving. The total will be the amount of carbohydrates you will be having when you eat or drink this food or beverage. Learning Standard Serving Sizes of Food When you eat food that is not packaged or does not include "Nutrition Facts" on the label, you need to measure the servings in order to count the amount of carbohydrates.A serving of most carbohydrate-rich foods contains about 15 g of carbohydrates. The following list includes serving sizes of carbohydrate-rich foods that provide 15 g ofcarbohydrate per serving:   1 slice of bread (1 oz) or 1 six-inch tortilla.    of a hamburger bun or English muffin.  4-6 crackers.   cup unsweetened dry cereal.    cup hot cereal.    cup rice or pasta.    cup mashed potatoes or  of a large baked potato.  1 cup fresh fruit or one small piece of fruit.    cup canned or frozen fruit or fruit juice.  1 cup milk.   cup plain fat-free yogurt or yogurt sweetened with artificial sweeteners.   cup cooked dried beans or starchy vegetable, such as peas, corn, or potatoes.  Decide the number of standard-size servings that you will eat. Multiply that number of servings by 15 (the grams   of carbohydrates in that serving). For example, if you eat 2 cups of strawberries, you will have eaten 2 servings and 30 g of carbohydrates (2 servings x 15 g = 30 g). For foods such as soups and casseroles, in which more than one food is mixed in, you will need to count the carbohydrates in each food that is included. EXAMPLE OF CARBOHYDRATE COUNTING Sample Dinner  3 oz chicken breast.   cup of brown rice.   cup of corn.  1 cup milk.   1 cup strawberries with sugar-free whipped topping.  Carbohydrate Calculation Step 1: Identify the foods that contain carbohydrates:   Rice.   Corn.   Milk.   Strawberries. Step 2:Calculate the number of servings eaten of each:   2 servings of rice.   1 serving of corn.   1 serving of milk.   1 serving of strawberries. Step 3: Multiply each of those number of servings by 15 g:   2 servings of rice x 15 g = 30 g.   1 serving of corn x 15 g = 15 g.   1 serving of milk x 15 g = 15 g.   1 serving of strawberries x 15 g = 15 g. Step 4: Add together all of the amounts to find the total grams of carbohydrates eaten: 30 g + 15 g + 15 g + 15 g = 75 g.   This information is not intended to replace advice given to you by your health care provider. Make sure you discuss any questions you have with your health care provider.   Document Released: 10/29/2005 Document Revised: 11/19/2014 Document Reviewed: 09/25/2013 Elsevier Interactive Patient Education 2016 Elsevier Inc[employer]  

## 2016-07-27 LAB — MICROALBUMIN / CREATININE URINE RATIO
Creatinine, Urine: 173 mg/dL (ref 20–320)
Microalb Creat Ratio: 52 mcg/mg creat — ABNORMAL HIGH (ref <30)
Microalb, Ur: 9 mg/dL

## 2016-08-10 MED FILL — glipiZIDE 10 MG TABS: 10 | 30 days supply | Qty: 60 | Fill #9

## 2016-08-30 MED FILL — FENOFIBRATE 145 MG TABLET: 145 | 30 days supply | Qty: 30 | Fill #10

## 2016-09-10 MED FILL — glipiZIDE 10 MG TABS: 10 | 30 days supply | Qty: 60 | Fill #10

## 2016-10-02 ENCOUNTER — Ambulatory Visit: Payer: Medicare Other | Attending: Internal Medicine | Admitting: Pharmacist

## 2016-10-02 ENCOUNTER — Encounter: Payer: Self-pay | Admitting: Pharmacist

## 2016-10-02 VITALS — BP 138/88 | HR 72 | Wt 240.1 lb

## 2016-10-02 DIAGNOSIS — E1165 Type 2 diabetes mellitus with hyperglycemia: Secondary | ICD-10-CM

## 2016-10-02 DIAGNOSIS — I10 Essential (primary) hypertension: Secondary | ICD-10-CM | POA: Diagnosis not present

## 2016-10-02 DIAGNOSIS — E119 Type 2 diabetes mellitus without complications: Secondary | ICD-10-CM | POA: Insufficient documentation

## 2016-10-02 DIAGNOSIS — Z794 Long term (current) use of insulin: Secondary | ICD-10-CM | POA: Insufficient documentation

## 2016-10-02 DIAGNOSIS — IMO0001 Reserved for inherently not codable concepts without codable children: Secondary | ICD-10-CM

## 2016-10-02 LAB — GLUCOSE, POCT (MANUAL RESULT ENTRY): POC GLUCOSE: 221 mg/dL — AB (ref 70–99)

## 2016-10-02 MED ORDER — GLUCOSE BLOOD VI STRP: ORAL_STRIP | 12 refills | Status: DC

## 2016-10-02 MED ORDER — INSULIN GLARGINE 100 UNIT/ML SOLOSTAR PEN: 40.0000 [IU] | PEN_INJECTOR | Freq: Two times a day (BID) | SUBCUTANEOUS | 3 refills | Status: DC

## 2016-10-02 NOTE — Progress Notes (Signed)
    S:    Patient arrives in good spirits.  Presents for diabetes evaluation, education, and management at the request of Dr. Doreene Burke. Patient was referred on 07/26/16.  Patient was last seen by Primary Care Provider on 07/26/16.   Patient reports adherence with medications.  Current diabetes medications include: Lantus 80 units daily and glipizide 10 mg BID.  Current hypertension medications include: none  Patient denies hypoglycemic events.  Patient reported dietary habits: she reports that she ate ribs, corn, and drank some water this morning for breakfast.  Patient reported exercise habits: none   Patient denies nocturia.  Patient denies neuropathy. Patient denies visual changes. Patient reports self foot exams.   Patient reports stress at home - she has an older sister who is dealing with hoarding and just found out she is hosting family for Thanksgiving. She reports that she notices that her CBGs are elevated when she is stressed.   O:  Lab Results  Component Value Date   HGBA1C 12.1 07/26/2016   Vitals:   10/02/16 1214  BP: 138/88  Pulse: 72    Home fasting CBG: 180s-200s  2 hour post-prandial/random CBG: 200s  10 year ASCVD risk: 17.5%.  POCT glucose (post-prandial) = 221  A/P: Diabetes longstanding currently longstanding due to A1c of 12.1. Patient denies hypoglycemic events and is able to verbalize appropriate hypoglycemia management plan. Patient reports adherence with medication. Control is suboptimal due to dietary indiscretion, sedentary lifestyle, and stress.  Split the Lantus to 40 units twice a day. There is a literature to support splitting the dose at this time due either reduced absorption due to the high volume of insulin injected or if her body is very resistant to Lantus, the Lantus may not be lasting 24 hours and therefore will provide more steady control if given twice a day. If this does not control her blood glucose, will need to to add  post-prandial insulin at next visit. Continue glipizide for now, but if prandial insulin is added, this should be discontinued.   Next A1C anticipated December 2017.  Will obtain at next visit.  ASCVD risk greater than 7.5%. She had a rash to pravastatin and simvastatin in the past. Would consider starting atorvastatin or rosuvastatin in the future due to risk. Also consider addition of aspirin 81 mg daily.  Hypertension longstanding currently controlled if goal is <140/90. Recent guidelines have recommended <130/80 but will hold off adding therapy as this time since patient is significantly stressed and this may be driving blood pressure to be elevated.   Written patient instructions provided.  Total time in face to face counseling 30 minutes.   Follow up in Pharmacist Clinic Visit in 3 weeks if she is unable to get in to see Dr. Doreene Burke.

## 2016-10-02 NOTE — Patient Instructions (Signed)
Thanks for coming to see me!  Split the Lantus to 40 units twice daily - hopefully this will make a difference!!  If you ever need refills - call the office and tell them to let me Erline Levine) know. I will refill is as soon as I can!!  Follow up with Dr. Doreene Burke in 3 weeks (after December 14th for A1c). If you can't get in to see him, come back to see me!

## 2016-10-08 MED FILL — ?FENOFIBRATE 145 MG TABLET: 145 | 30 days supply | Qty: 30 | Fill #1

## 2016-10-08 MED FILL — ?GLIPIZIDE 10 MG TABLET: 10 | 30 days supply | Qty: 60 | Fill #0

## 2016-10-09 ENCOUNTER — Other Ambulatory Visit: Payer: Self-pay | Admitting: Internal Medicine

## 2016-10-09 DIAGNOSIS — E119 Type 2 diabetes mellitus without complications: Secondary | ICD-10-CM

## 2016-10-09 MED FILL — ?GLIPIZIDE 10 MG TABLET: 10 | 30 days supply | Qty: 60 | Fill #1

## 2016-10-30 ENCOUNTER — Ambulatory Visit: Payer: Medicare Other | Attending: Internal Medicine | Admitting: Pharmacist

## 2016-10-30 DIAGNOSIS — E119 Type 2 diabetes mellitus without complications: Secondary | ICD-10-CM

## 2016-10-30 DIAGNOSIS — Z794 Long term (current) use of insulin: Secondary | ICD-10-CM | POA: Diagnosis not present

## 2016-10-30 NOTE — Progress Notes (Signed)
    S:    Patient arrives in good spirits.  Presents for diabetes evaluation, education, and management at the request of Dr. Doreene Burke. Patient was referred on 07/26/16.  Patient was last seen by Primary Care Provider on 07/26/16.   Patient reports adherence with medications.  Current diabetes medications include: Lantus 40 units twice daily and glipizide 10 mg BID.  Current hypertension medications include: none  Patient denies hypoglycemic events.  Patient reported dietary habits: she has been eating better and smaller amounts  Patient reported exercise habits: walking on the treadmill daily   Patient denies nocturia.  Patient denies neuropathy. Patient denies visual changes. Patient reports self foot exams.   Reports continued stress - during the visit she received a call from her brother in law that her sister had fallen and broken her leg.    O:  Lab Results  Component Value Date   HGBA1C 12.1 07/26/2016   There were no vitals filed for this visit.  Home fasting CBG: 150s-190s 2 hour post-prandial/random CBG: 100s - 220  10 year ASCVD risk: 17.5%.  A/P: Diabetes longstanding currently longstanding due to A1c of 12.1 but improving based on home CBGs. Patient denies hypoglycemic events and is able to verbalize appropriate hypoglycemia management plan. Patient reports adherence with medication. Control is suboptimal due to dietary indiscretion, sedentary lifestyle, and stress.  Continue Lantus 40 units twice daily and glipizide for now - consider increasing Lantus at next visit. Due for A1c but will obtain at visit with Dr. Doreene Burke due to family emergency.   Written patient instructions provided.  Total time in face to face counseling 30 minutes - visit ended abruptly due to family emergency.    Follow up in Pharmacist Clinic Visit PRN, scheduled to see Dr. Doreene Burke next week.

## 2016-10-30 NOTE — Patient Instructions (Signed)
Thanks for coming to see me!  You are doing really well considering everything that is going on!  Schedule a visit with Dr. Doreene Burke - you are due for your 3 month follow up with him.  Continue the Lantus 40 units twice a day and the glipizide.

## 2016-11-08 ENCOUNTER — Encounter: Payer: Self-pay | Admitting: Internal Medicine

## 2016-11-08 ENCOUNTER — Ambulatory Visit: Payer: Medicare Other | Attending: Internal Medicine | Admitting: Internal Medicine

## 2016-11-08 VITALS — BP 144/63 | HR 71 | Temp 98.2°F | Resp 18 | Ht 67.0 in | Wt 241.0 lb

## 2016-11-08 DIAGNOSIS — N189 Chronic kidney disease, unspecified: Secondary | ICD-10-CM | POA: Diagnosis not present

## 2016-11-08 DIAGNOSIS — I10 Essential (primary) hypertension: Secondary | ICD-10-CM

## 2016-11-08 DIAGNOSIS — Z794 Long term (current) use of insulin: Secondary | ICD-10-CM | POA: Diagnosis not present

## 2016-11-08 DIAGNOSIS — E1122 Type 2 diabetes mellitus with diabetic chronic kidney disease: Secondary | ICD-10-CM | POA: Insufficient documentation

## 2016-11-08 DIAGNOSIS — I129 Hypertensive chronic kidney disease with stage 1 through stage 4 chronic kidney disease, or unspecified chronic kidney disease: Secondary | ICD-10-CM | POA: Insufficient documentation

## 2016-11-08 DIAGNOSIS — N760 Acute vaginitis: Secondary | ICD-10-CM

## 2016-11-08 DIAGNOSIS — E785 Hyperlipidemia, unspecified: Secondary | ICD-10-CM | POA: Diagnosis not present

## 2016-11-08 DIAGNOSIS — Z1231 Encounter for screening mammogram for malignant neoplasm of breast: Secondary | ICD-10-CM | POA: Diagnosis not present

## 2016-11-08 DIAGNOSIS — F329 Major depressive disorder, single episode, unspecified: Secondary | ICD-10-CM | POA: Insufficient documentation

## 2016-11-08 DIAGNOSIS — E119 Type 2 diabetes mellitus without complications: Secondary | ICD-10-CM | POA: Diagnosis not present

## 2016-11-08 DIAGNOSIS — Z85038 Personal history of other malignant neoplasm of large intestine: Secondary | ICD-10-CM | POA: Insufficient documentation

## 2016-11-08 DIAGNOSIS — Z1239 Encounter for other screening for malignant neoplasm of breast: Secondary | ICD-10-CM

## 2016-11-08 LAB — CBC WITH DIFFERENTIAL/PLATELET
BASOS PCT: 0 %
Basophils Absolute: 0 cells/uL (ref 0–200)
Eosinophils Absolute: 66 cells/uL (ref 15–500)
Eosinophils Relative: 1 %
HEMATOCRIT: 40.9 % (ref 35.0–45.0)
Hemoglobin: 13 g/dL (ref 11.7–15.5)
LYMPHS PCT: 27 %
Lymphs Abs: 1782 cells/uL (ref 850–3900)
MCH: 26.2 pg — ABNORMAL LOW (ref 27.0–33.0)
MCHC: 31.8 g/dL — ABNORMAL LOW (ref 32.0–36.0)
MCV: 82.3 fL (ref 80.0–100.0)
MONO ABS: 462 {cells}/uL (ref 200–950)
MPV: 9.6 fL (ref 7.5–12.5)
Monocytes Relative: 7 %
Neutro Abs: 4290 cells/uL (ref 1500–7800)
Neutrophils Relative %: 65 %
PLATELETS: 276 10*3/uL (ref 140–400)
RBC: 4.97 MIL/uL (ref 3.80–5.10)
RDW: 16.4 % — AB (ref 11.0–15.0)
WBC: 6.6 10*3/uL (ref 3.8–10.8)

## 2016-11-08 LAB — COMPLETE METABOLIC PANEL WITH GFR
ALT: 28 U/L (ref 6–29)
AST: 27 U/L (ref 10–35)
Albumin: 3.9 g/dL (ref 3.6–5.1)
Alkaline Phosphatase: 85 U/L (ref 33–130)
BILIRUBIN TOTAL: 0.2 mg/dL (ref 0.2–1.2)
BUN: 13 mg/dL (ref 7–25)
CO2: 26 mmol/L (ref 20–31)
Calcium: 8.8 mg/dL (ref 8.6–10.4)
Chloride: 105 mmol/L (ref 98–110)
Creat: 1.21 mg/dL — ABNORMAL HIGH (ref 0.50–0.99)
GFR, EST NON AFRICAN AMERICAN: 46 mL/min — AB (ref >=60)
GFR, Est African American: 53 mL/min — ABNORMAL LOW (ref >=60)
GLUCOSE: 323 mg/dL — AB (ref 65–99)
Potassium: 4.3 mmol/L (ref 3.5–5.3)
SODIUM: 139 mmol/L (ref 135–146)
TOTAL PROTEIN: 6.4 g/dL (ref 6.1–8.1)

## 2016-11-08 LAB — LIPID PANEL
CHOL/HDL RATIO: 6.4 ratio — AB (ref <5.0)
Cholesterol: 199 mg/dL (ref <200)
HDL: 31 mg/dL — AB (ref >50)
LDL CALC: 90 mg/dL (ref <100)
Triglycerides: 390 mg/dL — ABNORMAL HIGH (ref <150)
VLDL: 78 mg/dL — ABNORMAL HIGH (ref <30)

## 2016-11-08 LAB — POCT GLYCOSYLATED HEMOGLOBIN (HGB A1C): Hemoglobin A1C: 11.5

## 2016-11-08 LAB — TSH: TSH: 0.8 m[IU]/L

## 2016-11-08 LAB — GLUCOSE, POCT (MANUAL RESULT ENTRY): POC Glucose: 330 mg/dl — AB (ref 70–99)

## 2016-11-08 MED ORDER — FLUCONAZOLE 200 MG PO TABS
200.0000 mg | ORAL_TABLET | Freq: Once | ORAL | 1 refills | Status: AC
Start: 2016-11-08 — End: 2016-11-08

## 2016-11-08 MED ORDER — FLUCONAZOLE 200 MG PO TABS: 200.0000 mg | ORAL_TABLET | Freq: Once | ORAL | 1 refills | Status: DC

## 2016-11-08 MED FILL — FLUCONAZOLE 200 MG TABLET: 200 | 2 days supply | Qty: 2 | Fill #0

## 2016-11-08 NOTE — Progress Notes (Signed)
Catherine Fowler, is a 65 y.o. female  ZYS:[id number]  XNA:[id number]  DOB - [birth year]  No chief complaint on file.    Subjective:   Catherine Fowler is a 65 y.o. female with history of hypertension, dyslipidemia, major depression and type 2 diabetes mellitus uncontrolled, currently on Lantus insulin and glipizide 10 mg tablet by mouth twice a day here today for a follow up visit of DM and HTN. Patient feels happier with her blood sugar readings these days, now Lantus divided into 40 units 2x daily. She has a lot of emotional stress from multiple family members having health issues and a young boy in the family committed suicide recently, this has affected her a little but she is pulling through because she is definitely healthier and happy she is able to be there for those family members going through tough times. She has vaginal itching and mild discharge similar to her previous experience with yeast infection, requesting prescription of diflucan She has no other complaint today, no hypoglycemic episode, she follows up closely with our CPP for medication management and BS control. Patient has No headache, No chest pain, No abdominal pain - No Nausea, No new weakness tingling or numbness, No Cough - SOB.  Problem  Breast Cancer Screening   ALLERGIES: Allergies  Allergen Reactions  . Pravastatin Sodium Itching and Rash  . Simvastatin Rash   PAST MEDICAL HISTORY: Past Medical History:  Diagnosis Date  . Chronic kidney disease   . Colon cancer ([place]) 09/18/13  . Diabetes mellitus    MEDICATIONS AT HOME: Prior to Admission medications   Medication Sig Start Date End Date Taking? Authorizing Provider  fenofibrate (TRICOR) 145 MG tablet Take 1 tablet (145 mg total) by mouth daily. 07/26/16   Tresa Garter, MD  fluconazole (DIFLUCAN) 200 MG tablet Take 1 tablet (200 mg total) by mouth once. 11/08/16 11/08/16  Tresa Garter, MD  glipiZIDE (GLUCOTROL) 10 MG tablet Take 1 tablet (10 mg  total) by mouth 2 (two) times daily before a meal. 07/26/16   Tresa Garter, MD  glucose blood (WAVESENSE PRESTO) test strip Use as instructed 10/02/16   Tresa Garter, MD  glucose monitoring kit (FREESTYLE) monitoring kit 1 each by Does not apply route 4 (four) times daily - after meals and at bedtime. 1 month Diabetic Testing Supplies for QAC-QHS accuchecks. Any brand okay 08/14/13   Thurnell Lose, MD  Insulin Glargine (LANTUS SOLOSTAR) 100 UNIT/ML Solostar Pen Inject 40 Units into the skin 2 (two) times daily. 10/02/16   Tresa Garter, MD    Objective:  There were no vitals filed for this visit. Exam General appearance : Awake, alert, not in any distress. Speech Clear. Not toxic looking, obese HEENT: Atraumatic and Normocephalic, pupils equally reactive to light and accomodation Neck: Supple, no JVD. No cervical lymphadenopathy.  Chest: Good air entry bilaterally, no added sounds  CVS: S1 S2 regular, no murmurs.  Abdomen: Bowel sounds present, Non tender and not distended with no gaurding, rigidity or rebound. Extremities: B/L Lower Ext shows no edema, both legs are warm to touch Neurology: Awake alert, and oriented X 3, CN II-XII intact, Non focal Skin: No Rash  Data Review Lab Results  Component Value Date   HGBA1C 11.5 11/08/2016   HGBA1C 12.1 07/26/2016   HGBA1C 9.30 09/15/2015    Assessment & Plan   1. Type 2 diabetes mellitus without complication, unspecified long term insulin use status (HCC)  - POCT A1C - Glucose (  CBG) - CBC with Differential/Platelet - COMPLETE METABOLIC PANEL WITH GFR  2. Essential hypertension  - TSH  We have discussed target BP range and blood pressure goal. I have advised patient to check BP regularly and to call us back or report to clinic if the numbers are consistently higher than 140/90. We discussed the importance of compliance with medical therapy and DASH diet recommended, consequences of uncontrolled hypertension  discussed.  - continue current BP medications  3. Dyslipidemia  - Lipid panel  To address this please limit saturated fat to no more than 7% of your calories, limit cholesterol to 200 mg/day, increase fiber and exercise as tolerated. If needed we may add another cholesterol lowering medication to your regimen.   4. Breast cancer screening  - MM Digital Screening; Future  5. Vaginitis and vulvovaginitis  - fluconazole (DIFLUCAN) 200 MG tablet; Take 1 tablet (200 mg total) by mouth once.  Dispense: 1 tablet; Refill: 1  Patient have been counseled extensively about nutrition and exercise. Other issues discussed during this visit include: low cholesterol diet, weight control and daily exercise, foot care, annual eye examinations at Ophthalmology, importance of adherence with medications and regular follow-up. We also discussed long term complications of uncontrolled diabetes and hypertension.   Return in about 3 months (around 02/06/2017) for Hemoglobin A1C and Follow up, DM, Follow up HTN.  The patient was given clear instructions to go to ER or return to medical center if symptoms don't improve, worsen or new problems develop. The patient verbalized understanding. The patient was told to call to get lab results if they haven't heard anything in the next week.   This note has been created with Surveyor, quantity. Any transcriptional errors are unintentional.    Angelica Chessman, MD, [place], [place], [place], [place] and [place], [place]   11/08/2016, 12:21 PM

## 2016-11-08 NOTE — Patient Instructions (Signed)
Diabetes and Foot Care Diabetes may cause you to have problems because of poor blood supply (circulation) to your feet and legs. This may cause the skin on your feet to become thinner, break easier, and heal more slowly. Your skin may become dry, and the skin may peel and crack. You may also have nerve damage in your legs and feet causing decreased feeling in them. You may not notice minor injuries to your feet that could lead to infections or more serious problems. Taking care of your feet is one of the most important things you can do for yourself. Follow these instructions at home:  Wear shoes at all times, even in the house. Do not go barefoot. Bare feet are easily injured.  Check your feet daily for blisters, cuts, and redness. If you cannot see the bottom of your feet, use a mirror or ask someone for help.  Wash your feet with warm water (do not use hot water) and mild soap. Then pat your feet and the areas between your toes until they are completely dry. Do not soak your feet as this can dry your skin.  Apply a moisturizing lotion or petroleum jelly (that does not contain alcohol and is unscented) to the skin on your feet and to dry, brittle toenails. Do not apply lotion between your toes.  Trim your toenails straight across. Do not dig under them or around the cuticle. File the edges of your nails with an emery board or nail file.  Do not cut corns or calluses or try to remove them with medicine.  Wear clean socks or stockings every day. Make sure they are not too tight. Do not wear knee-high stockings since they may decrease blood flow to your legs.  Wear shoes that fit properly and have enough cushioning. To break in new shoes, wear them for just a few hours a day. This prevents you from injuring your feet. Always look in your shoes before you put them on to be sure there are no objects inside.  Do not cross your legs. This may decrease the blood flow to your feet.  If you find a  minor scrape, cut, or break in the skin on your feet, keep it and the skin around it clean and dry. These areas may be cleansed with mild soap and water. Do not cleanse the area with peroxide, alcohol, or iodine.  When you remove an adhesive bandage, be sure not to damage the skin around it.  If you have a wound, look at it several times a day to make sure it is healing.  Do not use heating pads or hot water bottles. They may burn your skin. If you have lost feeling in your feet or legs, you may not know it is happening until it is too late.  Make sure your health care provider performs a complete foot exam at least annually or more often if you have foot problems. Report any cuts, sores, or bruises to your health care provider immediately. Contact a health care provider if:  You have an injury that is not healing.  You have cuts or breaks in the skin.  You have an ingrown nail.  You notice redness on your legs or feet.  You feel burning or tingling in your legs or feet.  You have pain or cramps in your legs and feet.  Your legs or feet are numb.  Your feet always feel cold. Get help right away if:  There is increasing   redness, swelling, or pain in or around a wound.  There is a red line that goes up your leg.  Pus is coming from a wound.  You develop a fever or as directed by your health care provider.  You notice a bad smell coming from an ulcer or wound. This information is not intended to replace advice given to you by your health care provider. Make sure you discuss any questions you have with your health care provider. Document Released: 10/26/2000 Document Revised: 04/05/2016 Document Reviewed: 04/07/2013 Elsevier Interactive Patient Education  2017 [place]. Blood Glucose Monitoring, Adult Monitoring your blood sugar (glucose) helps you manage your diabetes. It also helps you and your health care provider determine how well your diabetes management plan is  working. Blood glucose monitoring involves checking your blood glucose as often as directed, and keeping a record (log) of your results over time. Why should I monitor my blood glucose? Checking your blood glucose regularly can:  Help you understand how food, exercise, illnesses, and medicines affect your blood glucose.  Let you know what your blood glucose is at any time. You can quickly tell if you are having low blood glucose (hypoglycemia) or high blood glucose (hyperglycemia).  Help you and your health care provider adjust your medicines as needed. When should I check my blood glucose? Follow instructions from your health care provider about how often to check your blood glucose. This may depend on:  The type of diabetes you have.  How well-controlled your diabetes is.  Medicines you are taking. If you have type 1 diabetes:  Check your blood glucose at least 2 times a day.  Also check your blood glucose:  Before every insulin injection.  Before and after exercise.  Between meals.  2 hours after a meal.  Occasionally between 2:00 a.m. and 3:00 a.m., as directed.  Before potentially dangerous tasks, like driving or using heavy machinery.  At bedtime.  You may need to check your blood glucose more often, up to 6-10 times a day:  If you use an insulin pump.  If you need multiple daily injections (MDI).  If your diabetes is not well-controlled.  If you are ill.  If you have a history of severe hypoglycemia.  If you have a history of not knowing when your blood glucose is getting low (hypoglycemia unawareness). If you have type 2 diabetes:  If you take insulin or other diabetes medicines, check your blood glucose at least 2 times a day.  If you are on intensive insulin therapy, check your blood glucose at least 4 times a day. Occasionally, you may also need to check between 2:00 a.m. and 3:00 a.m., as directed.  Also check your blood glucose:  Before and after  exercise.  Before potentially dangerous tasks, like driving or using heavy machinery.  You may need to check your blood glucose more often if:  Your medicine is being adjusted.  Your diabetes is not well-controlled.  You are ill. What is a blood glucose log?  A blood glucose log is a record of your blood glucose readings. It helps you and your health care provider:  Look for patterns in your blood glucose over time.  Adjust your diabetes management plan as needed.  Every time you check your blood glucose, write down your result and notes about things that may be affecting your blood glucose, such as your diet and exercise for the day.  Most glucose meters store a record of glucose readings in  the meter. Some meters allow you to download your records to a computer. How do I check my blood glucose? Follow these steps to get accurate readings of your blood glucose: Supplies needed   Blood glucose meter.  Test strips for your meter. Each meter has its own strips. You must use the strips that come with your meter.  A needle to prick your finger (lancet). Do not use lancets more than once.  A device that holds the lancet (lancing device).  A journal or log book to write down your results. Procedure  Wash your hands with soap and water.  Prick the side of your finger (not the tip) with the lancet. Use a different finger each time.  Gently rub the finger until a small drop of blood appears.  Follow instructions that come with your meter for inserting the test strip, applying blood to the strip, and using your blood glucose meter.  Write down your result and any notes. Alternative testing sites  Some meters allow you to use areas of your body other than your finger (alternative sites) to test your blood.  If you think you may have hypoglycemia, or if you have hypoglycemia unawareness, do not use alternative sites. Use your finger instead.  Alternative sites may not be as  accurate as the fingers, because blood flow is slower in these areas. This means that the result you get may be delayed, and it may be different from the result that you would get from your finger.  The most common alternative sites are:  Forearm.  Thigh.  Palm of the hand. Additional tips  Always keep your supplies with you.  If you have questions or need help, all blood glucose meters have a 24-hour "hotline" number that you can call. You may also contact your health care provider.  After you use a few boxes of test strips, adjust (calibrate) your blood glucose meter by following instructions that came with your meter. This information is not intended to replace advice given to you by your health care provider. Make sure you discuss any questions you have with your health care provider. Document Released: 11/01/2003 Document Revised: 05/18/2016 Document Reviewed: 04/09/2016 Elsevier Interactive Patient Education  2017 [employer]  

## 2016-11-09 ENCOUNTER — Telehealth: Payer: Self-pay | Admitting: *Deleted

## 2016-11-09 NOTE — Telephone Encounter (Signed)
-----   Message from Tresa Garter, MD sent at 11/09/2016 10:27 AM EST ----- Please inform the patient that her lab results showed stable kidney function over the years, improving HbA1C, improved triglyceride level but still need to continue fenofibrate. Thyroid function is normal. Continue blood sugar control as discussed, low carbohydrate diet, and regular physical exercise as tolerated, 150 minutes per week (30 min each day, 5 days per week, or 50 min 3 days per week).

## 2016-11-09 NOTE — Telephone Encounter (Signed)
Patient verified DOB Patient is aware of lab results showing stable kidney function over the years. Patient is aware of thyroid function being normal and to continue controlling blood sugars. Patient is advised to continue taking fenofibrate and to increase exercise and fiber. Patient expressed her understanding and had no further questions at this time.

## 2016-11-13 ENCOUNTER — Other Ambulatory Visit: Payer: Self-pay | Admitting: Internal Medicine

## 2016-11-13 DIAGNOSIS — Z1231 Encounter for screening mammogram for malignant neoplasm of breast: Secondary | ICD-10-CM

## 2016-11-14 MED FILL — ?FENOFIBRATE 145 MG TABLET: 145 | 30 days supply | Qty: 30 | Fill #2

## 2016-11-14 MED FILL — ?GLIPIZIDE 10 MG TABLET: 10 | 30 days supply | Qty: 60 | Fill #2

## 2016-12-06 ENCOUNTER — Ambulatory Visit
Admission: RE | Admit: 2016-12-06 | Discharge: 2016-12-06 | Disposition: A | Discharge status: Home / Self Care | Payer: Medicare Other | Source: Ambulatory Visit | Attending: Internal Medicine | Admitting: Internal Medicine

## 2016-12-06 DIAGNOSIS — Z1231 Encounter for screening mammogram for malignant neoplasm of breast: Secondary | ICD-10-CM | POA: Diagnosis not present

## 2016-12-07 ENCOUNTER — Telehealth: Payer: Self-pay | Admitting: *Deleted

## 2016-12-07 NOTE — Telephone Encounter (Signed)
-----   Message from Tresa Garter, MD sent at 12/06/2016  6:01 PM EST ----- Please inform patient that her screening mammogram shows no evidence of malignancy. Recommend screening mammogram in one year

## 2016-12-07 NOTE — Telephone Encounter (Signed)
Patient verified DOB Patient is aware of mammogram showing no evidence of malignancy. Patient aware of a recommended being completed in one year. Patient expressed her understanding and had no further questions at this time.

## 2016-12-11 MED FILL — ?FENOFIBRATE 145 MG TABLET: 145 | 30 days supply | Qty: 30 | Fill #3

## 2016-12-11 MED FILL — ?GLIPIZIDE 10 MG TABLET: 10 | 30 days supply | Qty: 60 | Fill #3

## 2017-01-02 ENCOUNTER — Ambulatory Visit: Payer: Medicare Other | Attending: Internal Medicine | Admitting: Internal Medicine

## 2017-01-02 ENCOUNTER — Encounter: Payer: Self-pay | Admitting: Internal Medicine

## 2017-01-02 VITALS — BP 137/74 | HR 76 | Temp 98.5°F | Resp 18 | Ht 67.0 in | Wt 242.0 lb

## 2017-01-02 DIAGNOSIS — Z85038 Personal history of other malignant neoplasm of large intestine: Secondary | ICD-10-CM | POA: Insufficient documentation

## 2017-01-02 DIAGNOSIS — R109 Unspecified abdominal pain: Secondary | ICD-10-CM | POA: Insufficient documentation

## 2017-01-02 DIAGNOSIS — E1165 Type 2 diabetes mellitus with hyperglycemia: Secondary | ICD-10-CM

## 2017-01-02 DIAGNOSIS — N189 Chronic kidney disease, unspecified: Secondary | ICD-10-CM | POA: Diagnosis not present

## 2017-01-02 DIAGNOSIS — R1030 Lower abdominal pain, unspecified: Secondary | ICD-10-CM | POA: Diagnosis not present

## 2017-01-02 DIAGNOSIS — E1122 Type 2 diabetes mellitus with diabetic chronic kidney disease: Secondary | ICD-10-CM | POA: Diagnosis not present

## 2017-01-02 DIAGNOSIS — IMO0001 Reserved for inherently not codable concepts without codable children: Secondary | ICD-10-CM

## 2017-01-02 DIAGNOSIS — F329 Major depressive disorder, single episode, unspecified: Secondary | ICD-10-CM | POA: Insufficient documentation

## 2017-01-02 DIAGNOSIS — I10 Essential (primary) hypertension: Secondary | ICD-10-CM

## 2017-01-02 DIAGNOSIS — Z9884 Bariatric surgery status: Secondary | ICD-10-CM | POA: Insufficient documentation

## 2017-01-02 DIAGNOSIS — L304 Erythema intertrigo: Secondary | ICD-10-CM | POA: Diagnosis not present

## 2017-01-02 DIAGNOSIS — E785 Hyperlipidemia, unspecified: Secondary | ICD-10-CM

## 2017-01-02 DIAGNOSIS — F4322 Adjustment disorder with anxiety: Secondary | ICD-10-CM

## 2017-01-02 DIAGNOSIS — Z794 Long term (current) use of insulin: Secondary | ICD-10-CM | POA: Insufficient documentation

## 2017-01-02 DIAGNOSIS — E119 Type 2 diabetes mellitus without complications: Secondary | ICD-10-CM | POA: Diagnosis not present

## 2017-01-02 LAB — POCT GLYCOSYLATED HEMOGLOBIN (HGB A1C): Hemoglobin A1C: 12.4

## 2017-01-02 LAB — GLUCOSE, POCT (MANUAL RESULT ENTRY): POC Glucose: 351 mg/dl — AB (ref 70–99)

## 2017-01-02 MED ORDER — INSULIN GLARGINE 100 UNIT/ML SOLOSTAR PEN: 50.0000 [IU] | PEN_INJECTOR | Freq: Two times a day (BID) | SUBCUTANEOUS | 3 refills | Status: DC

## 2017-01-02 MED ORDER — NYSTATIN 100000 UNIT/GM EX POWD: Freq: Four times a day (QID) | CUTANEOUS | 1 refills | Status: DC

## 2017-01-02 MED ORDER — BUSPIRONE HCL 10 MG PO TABS: 10.0000 mg | ORAL_TABLET | Freq: Two times a day (BID) | ORAL | 3 refills | Status: DC

## 2017-01-02 MED ORDER — NYSTATIN 100000 UNIT/GM EX POWD
Freq: Four times a day (QID) | CUTANEOUS | 1 refills | Status: DC
Start: 2017-01-02 — End: 2017-01-14

## 2017-01-02 MED FILL — NYSTOP 100,000 UNITS/GM PWD: 100000 | 30 days supply | Qty: 45 | Fill #0

## 2017-01-02 MED FILL — busPIRone HCL 10 MG TABS: 10 | 30 days supply | Qty: 60 | Fill #0

## 2017-01-02 NOTE — Progress Notes (Signed)
Patient is here for stomach concerns  Patient complains of intermittent stomach pains being present for the past 3 weeks. Patient complains of yeast in between her groin area. Patient request refills on medications.  Patient has taken medication today. Patient has eaten today.

## 2017-01-02 NOTE — Patient Instructions (Signed)
Abdominal Pain, Adult °Abdominal pain can be caused by many things. Often, abdominal pain is not serious and it gets better with no treatment or by being treated at home. However, sometimes abdominal pain is serious. Your health care provider will do a medical history and a physical exam to try to determine the cause of your abdominal pain. °Follow these instructions at home: °· Take over-the-counter and prescription medicines only as told by your health care provider. Do not take a laxative unless told by your health care provider. °· Drink enough fluid to keep your urine clear or pale yellow. °· Watch your condition for any changes. °· Keep all follow-up visits as told by your health care provider. This is important. °Contact a health care provider if: °· Your abdominal pain changes or gets worse. °· You are not hungry or you lose weight without trying. °· You are constipated or have diarrhea for more than 2-3 days. °· You have pain when you urinate or have a bowel movement. °· Your abdominal pain wakes you up at night. °· Your pain gets worse with meals, after eating, or with certain foods. °· You are throwing up and cannot keep anything down. °· You have a fever. °Get help right away if: °· Your pain does not go away as soon as your health care provider told you to expect. °· You cannot stop throwing up. °· Your pain is only in areas of the abdomen, such as the right side or the left lower portion of the abdomen. °· You have bloody or black stools, or stools that look like tar. °· You have severe pain, cramping, or bloating in your abdomen. °· You have signs of dehydration, such as: °? Dark urine, very little urine, or no urine. °? Cracked lips. °? Dry mouth. °? Sunken eyes. °? Sleepiness. °? Weakness. °This information is not intended to replace advice given to you by your health care provider. Make sure you discuss any questions you have with your health care provider. °Document Released: 08/08/2005 Document  Revised: 05/18/2016 Document Reviewed: 04/11/2016 °Elsevier Interactive Patient Education © 2017 [employer] ° ° °Blood Glucose Monitoring, Adult °Monitoring your blood sugar (glucose) helps you manage your diabetes. It also helps you and your health care provider determine how well your diabetes management plan is working. Blood glucose monitoring involves checking your blood glucose as often as directed, and keeping a record (log) of your results over time. °Why should I monitor my blood glucose? °Checking your blood glucose regularly can: °· Help you understand how food, exercise, illnesses, and medicines affect your blood glucose. °· Let you know what your blood glucose is at any time. You can quickly tell if you are having low blood glucose (hypoglycemia) or high blood glucose (hyperglycemia). °· Help you and your health care provider adjust your medicines as needed. ° °When should I check my blood glucose? °Follow instructions from your health care provider about how often to check your blood glucose. This may depend on: °· The type of diabetes you have. °· How well-controlled your diabetes is. °· Medicines you are taking. ° °If you have type 1 diabetes: °· Check your blood glucose at least 2 times a day. °· Also check your blood glucose: °? Before every insulin injection. °? Before and after exercise. °? Between meals. °? 2 hours after a meal. °? Occasionally between 2:00 a.m. and 3:00 a.m., as directed. °? Before potentially dangerous tasks, like driving or using heavy machinery. °? At bedtime. °·   You may need to check your blood glucose more often, up to 6-10 times a day: °? If you use an insulin pump. °? If you need multiple daily injections (MDI). °? If your diabetes is not well-controlled. °? If you are ill. °? If you have a history of severe hypoglycemia. °? If you have a history of not knowing when your blood glucose is getting low (hypoglycemia unawareness). °If you have type 2 diabetes: °· If you  take insulin or other diabetes medicines, check your blood glucose at least 2 times a day. °· If you are on intensive insulin therapy, check your blood glucose at least 4 times a day. Occasionally, you may also need to check between 2:00 a.m. and 3:00 a.m., as directed. °· Also check your blood glucose: °? Before and after exercise. °? Before potentially dangerous tasks, like driving or using heavy machinery. °· You may need to check your blood glucose more often if: °? Your medicine is being adjusted. °? Your diabetes is not well-controlled. °? You are ill. °What is a blood glucose log? °· A blood glucose log is a record of your blood glucose readings. It helps you and your health care provider: °? Look for patterns in your blood glucose over time. °? Adjust your diabetes management plan as needed. °· Every time you check your blood glucose, write down your result and notes about things that may be affecting your blood glucose, such as your diet and exercise for the day. °· Most glucose meters store a record of glucose readings in the meter. Some meters allow you to download your records to a computer. °How do I check my blood glucose? °Follow these steps to get accurate readings of your blood glucose: °Supplies needed ° °· Blood glucose meter. °· Test strips for your meter. Each meter has its own strips. You must use the strips that come with your meter. °· A needle to prick your finger (lancet). Do not use lancets more than once. °· A device that holds the lancet (lancing device). °· A journal or log book to write down your results. °Procedure °· Wash your hands with soap and water. °· Prick the side of your finger (not the tip) with the lancet. Use a different finger each time. °· Gently rub the finger until a small drop of blood appears. °· Follow instructions that come with your meter for inserting the test strip, applying blood to the strip, and using your blood glucose meter. °· Write down your result and  any notes. °Alternative testing sites °· Some meters allow you to use areas of your body other than your finger (alternative sites) to test your blood. °· If you think you may have hypoglycemia, or if you have hypoglycemia unawareness, do not use alternative sites. Use your finger instead. °· Alternative sites may not be as accurate as the fingers, because blood flow is slower in these areas. This means that the result you get may be delayed, and it may be different from the result that you would get from your finger. °· The most common alternative sites are: °? Forearm. °? Thigh. °? Palm of the hand. °Additional tips °· Always keep your supplies with you. °· If you have questions or need help, all blood glucose meters have a 24-hour “hotline” number that you can call. You may also contact your health care provider. °· After you use a few boxes of test strips, adjust (calibrate) your blood glucose meter by following instructions that   came with your meter. °This information is not intended to replace advice given to you by your health care provider. Make sure you discuss any questions you have with your health care provider. °Document Released: 11/01/2003 Document Revised: 05/18/2016 Document Reviewed: 04/09/2016 °Elsevier Interactive Patient Education © 2017 [employer] ° °

## 2017-01-02 NOTE — Progress Notes (Signed)
Subjective:  Patient ID: Catherine Fowler, female    DOB: [birth year]  Age: 66 y.o. MRN: [id number]  CC: Abdominal Pain  HPI [name] is a 66 yo female w/ hx of hptn, dyslipidemia, major depression, type 2 dm uncontrolled currently on lantus, glipizide, here for f/u visit of dm, hptn, skin infection, and new stomach pain.   Her HbA1C today is >12% which is increased from November (11.5). Last microalbumin was 07/2016. Fasting sugars have been running over 200. She is taking 40 u of lantus BID and glucotrol 10 mg BID. She says she is compliant with her medications. She exercises on the treadmill for 30 minutes each day. Says she watches her diet. She feels her increased sugars are related to recent stress. She has had visits with the pharmacist for diabetes teaching. She is not currently on an ace-I or statin due to intolerances.   She complains of chronic stress and feelings of worry, anxiety and depressed mood r/t family stressors. She feels this chronic stress has impacted her energy level and she is interested in an anti-depressant. She would prefer to take something every day and asks about an as needed medication.   We last checked her cholesterol in December and she showed elevated triglycerides (390). She is taking fenofibrate.   She has noticed redness and pain in her groin and folds that started 10 days ago. She has been using washing more often which has worsened. She has been applying monistat and triple antibiotic ointment to the area. She is morbidly obese and does not wear underwear.   Stomach pain- localizes pain to umbilicus, has hx of cancer of sigmoid cancer. Describes pain as strong ache and feels it wraps laterally from umbilicus. Rates pain 4/10, started 2 weeks ago. No n/v/d. Has not tried anything. Is concerned she has recurrence of her cancer which was diagnosed on colonoscopy. Surgery resolved the previous cancer. Last colonoscopy was 1 year ago and normal. Was supposed  to have next colonoscopy in 2 years ago.   Past Medical History:  Diagnosis Date  . Chronic kidney disease   . Colon cancer ([place]) 09/18/13  . Diabetes mellitus     Past Surgical History:  Procedure Laterality Date  . ABDOMINAL SURGERY    . APPENDECTOMY    . BREAST SURGERY  2/97   breast reduction   . CHOLECYSTECTOMY    . GASTRIC BYPASS  10/16/2004  . HERNIA REPAIR    . RHINOPLASTY    . TUBAL LIGATION    . VENTRAL HERNIA REPAIR  06/17/2012   Procedure: HERNIA REPAIR VENTRAL ADULT;  Surgeon: Adin Hector, MD;  Location: [facility];  Service: General;  Laterality: N/A;    Family History  Problem Relation Age of Onset  . Diabetes type II Father   . Diabetes type II Other   . Diabetes type II Sister   . Diabetes type II Maternal Aunt     Social History  Substance Use Topics  . Smoking status: Never Smoker  . Smokeless tobacco: Never Used  . Alcohol use No    ROS Review of Systems  Constitutional: Negative.   HENT: Negative.   Eyes: Negative.   Respiratory: Negative.   Cardiovascular: Negative.   Gastrointestinal: Negative.   Endocrine: Negative.   Genitourinary: Negative.   Musculoskeletal: Negative.   Skin: Negative.   Neurological: Negative.   Psychiatric/Behavioral: Negative.     Objective:   Today's Vitals: BP 137/74 (BP Location: Right Arm, Patient  Position: Sitting, Cuff Size: Normal)   Pulse 76   Temp 98.5 F (36.9 C) (Oral)   Resp 18   Ht '5\' 7"'  (1.702 m)   Wt 242 lb (109.8 kg)   SpO2 96%   BMI 37.90 kg/m   Physical Exam  Constitutional: She is oriented to person, place, and time.  Morbidly obese  HENT:  Head: Normocephalic.  Eyes: Conjunctivae are normal. Pupils are equal, round, and reactive to light.  Neck: Normal range of motion. Neck supple. No JVD present.  Cardiovascular: Normal rate and regular rhythm.   Pulmonary/Chest: Effort normal and breath sounds normal.  Abdominal: Soft. Bowel sounds are normal.  Genitourinary:      Musculoskeletal: Normal range of motion.  Neurological: She is alert and oriented to person, place, and time.  Skin: Skin is warm and dry.  Psychiatric: She has a normal mood and affect. Her behavior is normal. Thought content normal.    Assessment & Plan:   No problem-specific Assessment & Plan notes found for this encounter.  DM - uncontrolled. Her sugars continue to be elevated. We will increase her Lantus today and have her see the pharmacist again to discuss healthy eating habits, weight loss, exercise, and compliance - increase lantus to 50 u lantus BID - follow up with Erline Levine, bring blood sugar log  Intertriginous dermatitis- I think this is related to moisture in the area worsened by friction and obesity. She has deep skin folds and  - wear cotton underwear during the day and go underwear free at night - use a barrier cream to avoid friction such as Desitin when you are up and about during the day. - apply nystatin powder to the region at night  - losing weight will help this condition as the area can get more air circulation  Adjustment Disorder w/ anxiety - she is obviously anxious during most appointments and even tearful during this appointment. At this time she is interested in medication and would benefit from medication.  - buspar- start 31m BID   Stomach Pain - because of her history of colon cancer I feel that a repeat colonoscopy would be most revealing and her put her mind at ease. She does not have obvious digestive symptoms or blood in her stool which is reassuring. I would like to do a CT scan but she is quite claustrophobic. She likely will need medications for anxiety to be able to complete the scan.  - repeat colonoscopy  - CT of abdomen & pelvis  Outpatient Encounter Prescriptions as of 01/02/2017  Medication Sig  . fenofibrate (TRICOR) 145 MG tablet Take 1 tablet (145 mg total) by mouth daily.  .Marland KitchenglipiZIDE (GLUCOTROL) 10 MG tablet Take 1 tablet (10 mg  total) by mouth 2 (two) times daily before a meal.  . glucose blood (WAVESENSE PRESTO) test strip Use as instructed  . glucose monitoring kit (FREESTYLE) monitoring kit 1 each by Does not apply route 4 (four) times daily - after meals and at bedtime. 1 month Diabetic Testing Supplies for QAC-QHS accuchecks. Any brand okay  . Insulin Glargine (LANTUS SOLOSTAR) 100 UNIT/ML Solostar Pen Inject 50 Units into the skin 2 (two) times daily.  . [DISCONTINUED] Insulin Glargine (LANTUS SOLOSTAR) 100 UNIT/ML Solostar Pen Inject 40 Units into the skin 2 (two) times daily.  . busPIRone (BUSPAR) 10 MG tablet Take 1 tablet (10 mg total) by mouth 2 (two) times daily.  .Marland Kitchennystatin (MYCOSTATIN/NYSTOP) powder Apply topically 4 (four) times daily. Apply to  Affected area 4 times daily   No facility-administered encounter medications on file as of 01/02/2017.     Follow-up: Return in about 2 weeks (around 01/16/2017) for [facility]PP [facility] for BP/CBG and DM management.    Beckey Rutter, AGNP Student  Evaluation and management procedures were performed by me with DNP Student in attendance, note written by DNP student under my supervision and collaboration. I have reviewed the note and I agree with the management and plan.   Angelica Chessman, MD, [place], [place], [place], [place] and [place], [place]   01/05/2017, 1:02 PM

## 2017-01-07 ENCOUNTER — Telehealth: Payer: Self-pay | Admitting: *Deleted

## 2017-01-07 NOTE — Telephone Encounter (Signed)
Patient verified DOB Patient is aware of CT being scheduled for March 2 at 12:45 pm. Patient advised NPO four hours prior. Patient also advised to contact [facility] regarding referral appointment. Patient expressed her understanding and had no further questions at this time.

## 2017-01-11 ENCOUNTER — Other Ambulatory Visit: Payer: Self-pay | Admitting: Internal Medicine

## 2017-01-11 ENCOUNTER — Ambulatory Visit (HOSPITAL_COMMUNITY)
Admission: RE | Admit: 2017-01-11 | Discharge: 2017-01-11 | Disposition: A | Discharge status: Home / Self Care | Payer: Medicare Other | Source: Ambulatory Visit | Attending: Internal Medicine | Admitting: Internal Medicine

## 2017-01-11 ENCOUNTER — Encounter (HOSPITAL_COMMUNITY): Payer: Self-pay

## 2017-01-11 DIAGNOSIS — K760 Fatty (change of) liver, not elsewhere classified: Secondary | ICD-10-CM | POA: Diagnosis not present

## 2017-01-11 DIAGNOSIS — M4316 Spondylolisthesis, lumbar region: Secondary | ICD-10-CM | POA: Insufficient documentation

## 2017-01-11 DIAGNOSIS — R1030 Lower abdominal pain, unspecified: Secondary | ICD-10-CM | POA: Insufficient documentation

## 2017-01-11 DIAGNOSIS — N1330 Unspecified hydronephrosis: Secondary | ICD-10-CM | POA: Insufficient documentation

## 2017-01-11 DIAGNOSIS — N83202 Unspecified ovarian cyst, left side: Secondary | ICD-10-CM | POA: Diagnosis not present

## 2017-01-11 DIAGNOSIS — Z85038 Personal history of other malignant neoplasm of large intestine: Secondary | ICD-10-CM | POA: Diagnosis not present

## 2017-01-11 DIAGNOSIS — Z9049 Acquired absence of other specified parts of digestive tract: Secondary | ICD-10-CM | POA: Diagnosis not present

## 2017-01-11 DIAGNOSIS — N135 Crossing vessel and stricture of ureter without hydronephrosis: Secondary | ICD-10-CM | POA: Diagnosis not present

## 2017-01-11 DIAGNOSIS — Z9884 Bariatric surgery status: Secondary | ICD-10-CM | POA: Insufficient documentation

## 2017-01-11 DIAGNOSIS — N261 Atrophy of kidney (terminal): Secondary | ICD-10-CM | POA: Insufficient documentation

## 2017-01-11 DIAGNOSIS — K439 Ventral hernia without obstruction or gangrene: Secondary | ICD-10-CM | POA: Insufficient documentation

## 2017-01-11 LAB — POCT I-STAT CREATININE: CREATININE: 1.3 mg/dL — AB (ref 0.44–1.00)

## 2017-01-11 MED ORDER — IOPAMIDOL (ISOVUE-300) INJECTION 61%
INTRAVENOUS | Status: AC
  Administered 2017-01-11: 80 mL
  Filled 2017-01-11: qty 100

## 2017-01-14 ENCOUNTER — Ambulatory Visit (INDEPENDENT_AMBULATORY_CARE_PROVIDER_SITE_OTHER): Payer: Medicare Other

## 2017-01-14 ENCOUNTER — Encounter (HOSPITAL_COMMUNITY): Payer: Self-pay | Admitting: Emergency Medicine

## 2017-01-14 ENCOUNTER — Ambulatory Visit (HOSPITAL_COMMUNITY)
Admission: EM | Admit: 2017-01-14 | Discharge: 2017-01-14 | Disposition: A | Discharge status: Home / Self Care | Payer: Medicare Other | Attending: Family Medicine | Admitting: Family Medicine

## 2017-01-14 ENCOUNTER — Telehealth: Payer: Self-pay | Admitting: Internal Medicine

## 2017-01-14 DIAGNOSIS — L304 Erythema intertrigo: Secondary | ICD-10-CM | POA: Diagnosis not present

## 2017-01-14 DIAGNOSIS — R109 Unspecified abdominal pain: Secondary | ICD-10-CM | POA: Diagnosis not present

## 2017-01-14 DIAGNOSIS — K639 Disease of intestine, unspecified: Secondary | ICD-10-CM

## 2017-01-14 DIAGNOSIS — R1012 Left upper quadrant pain: Secondary | ICD-10-CM

## 2017-01-14 MED ORDER — NYSTATIN 100000 UNIT/GM EX POWD: Freq: Four times a day (QID) | CUTANEOUS | 1 refills | Status: DC

## 2017-01-14 MED ORDER — TRAMADOL HCL 50 MG PO TABS: 50.0000 mg | ORAL_TABLET | Freq: Four times a day (QID) | ORAL | 0 refills | Status: DC | PRN

## 2017-01-14 MED ORDER — POLYETHYLENE GLYCOL 3350 17 G PO PACK: 17.0000 g | PACK | Freq: Every day | ORAL | 0 refills | Status: DC

## 2017-01-14 MED ORDER — DICYCLOMINE HCL 10 MG PO CAPS: 10.0000 mg | ORAL_CAPSULE | Freq: Three times a day (TID) | ORAL | 0 refills | Status: DC

## 2017-01-14 MED FILL — glipiZIDE 10 MG TABS: 10 | 30 days supply | Qty: 60 | Fill #4

## 2017-01-14 MED FILL — DICYCLOMINE 10 MG CAPSULE: 10 | 7 days supply | Qty: 30 | Fill #0

## 2017-01-14 MED FILL — traMADol HCL 50 MG TABS: 50 | 4 days supply | Qty: 15 | Fill #0

## 2017-01-14 MED FILL — FENOFIBRATE 145 MG TABLET: 145 | 30 days supply | Qty: 30 | Fill #4

## 2017-01-14 NOTE — Discharge Instructions (Addendum)
Please take Bentyl as directed daily for next week. Please take Miralax 1-2 times daily to help with the constipation. Please continue using tylenol for pain. You can use tramadol as needed for break through pain. Please follow for PCP on Thursday.

## 2017-01-14 NOTE — ED Triage Notes (Signed)
The patient presented to the [facility] with a complaint of lower abdominal pain that she feels is related to a previous cancer diagnosis. She stated that she is under the care of Dr. Doreene Burke and his office stated that they "could not find him" and sent the patient here for pain control.

## 2017-01-14 NOTE — Telephone Encounter (Signed)
Patient presents stating that she is pain and would like to know if she may have some medication for [ain. Also states that she was denied a refill for her prescription for her rash because it was too soon.Patient stated that pain was intolerable.  Patient was referred to Urgent Care.

## 2017-01-14 NOTE — ED Provider Notes (Signed)
CSN: [id number]     Arrival date & time 01/14/17  1004 History   None    Chief Complaint  Patient presents with  . Abdominal Pain   (Consider location/radiation/quality/duration/timing/severity/associated sxs/prior Treatment) ABDOMINAL PAIN  Catherine Fowler is 66 y.o. with pmhx significant for uncontrolled T2DM, CKD3, HTN, and Hx of Colon Cancer presenting with abdominal pain. States abut 3 years ago had lower abdominal pain, was found to have stage 1 sigmoid colon cancer.  Patient now with similar pain which began about 3 weeks ago. Patient states it constant, with intermittent stabbing. Patient indicates the pain is more focused on the the left upper abdomen, previously was radiating across abdomen. Nothing makes it worse or better. Still has a good appetite. Not associated with food in paticularly. No nausea/vomiting. Having normal bowel movements. Last night had difficulty sleeping, due to increased. Had an appointment with Dr. Rosalie Gums at the [facility]. Had a CT abdomen done in on January 11, 2017. Patient had long term issues with wound healing.  Medications tried: Has used  hydrocodiene - taking half a pill, as well as tylenol Similar pain when patient was diagnosised with  Prior abdominal surgeries:   Symptoms Diarrhea /Constipation:None  Blood in stool: None Fever: No fevers  Dysuria: None  Urgency: None   Vaginal Bleeding: None         Past Medical History:  Diagnosis Date  . Chronic kidney disease   . Colon cancer ([place]) 09/18/13  . Diabetes mellitus    Past Surgical History:  Procedure Laterality Date  . ABDOMINAL SURGERY    . APPENDECTOMY    . BREAST SURGERY  2/97   breast reduction   . CHOLECYSTECTOMY    . GASTRIC BYPASS  10/16/2004  . HERNIA REPAIR    . RHINOPLASTY    . TUBAL LIGATION    . VENTRAL HERNIA REPAIR  06/17/2012   Procedure: HERNIA REPAIR VENTRAL ADULT;  Surgeon: Adin Hector, MD;  Location: [facility];  Service: General;  Laterality: N/A;   Family  History  Problem Relation Age of Onset  . Diabetes type II Father   . Diabetes type II Other   . Diabetes type II Sister   . Diabetes type II Maternal Aunt    Social History  Substance Use Topics  . Smoking status: Never Smoker  . Smokeless tobacco: Never Used  . Alcohol use No   OB History    No data available     Review of Systems  Constitutional: Negative for chills and fever.  HENT: Negative for congestion.   Eyes: Negative for discharge and visual disturbance.  Respiratory: Negative for cough.   Cardiovascular: Negative for chest pain.  Gastrointestinal: Positive for abdominal distention and abdominal pain. Negative for blood in stool, constipation, diarrhea, nausea and vomiting.  Genitourinary: Negative for dysuria and flank pain.  Musculoskeletal: Negative for arthralgias.  Skin: Negative for color change and pallor.  Neurological: Negative for dizziness and headaches.    Allergies  Pravastatin sodium and Simvastatin  Home Medications   Prior to Admission medications   Medication Sig Start Date End Date Taking? Authorizing Provider  busPIRone (BUSPAR) 10 MG tablet Take 1 tablet (10 mg total) by mouth 2 (two) times daily. 01/02/17   Tresa Garter, MD  fenofibrate (TRICOR) 145 MG tablet Take 1 tablet (145 mg total) by mouth daily. 07/26/16   Olugbemiga Essie Christine, MD  glipiZIDE (GLUCOTROL) 10 MG tablet Take 1 tablet (10 mg total) by mouth 2 (two)  times daily before a meal. 07/26/16   Tresa Garter, MD  glucose blood (WAVESENSE PRESTO) test strip Use as instructed 10/02/16   Tresa Garter, MD  glucose monitoring kit (FREESTYLE) monitoring kit 1 each by Does not apply route 4 (four) times daily - after meals and at bedtime. 1 month Diabetic Testing Supplies for QAC-QHS accuchecks. Any brand okay 08/14/13   Thurnell Lose, MD  Insulin Glargine (LANTUS SOLOSTAR) 100 UNIT/ML Solostar Pen Inject 50 Units into the skin 2 (two) times daily. 01/02/17   Tresa Garter, MD  nystatin (MYCOSTATIN/NYSTOP) powder Apply topically 4 (four) times daily. Apply to Affected area 4 times daily 01/02/17   Tresa Garter, MD   Meds Ordered and Administered this Visit  Medications - No data to display  BP 159/66 (BP Location: Right Arm)   Pulse 69   Temp 98.3 F (36.8 C) (Oral)   Resp 14   SpO2 96%  No data found.   Physical Exam  Constitutional: She is oriented to person, place, and time. She appears well-developed and well-nourished.  HENT:  Head: Normocephalic and atraumatic.  Right Ear: External ear normal.  Left Ear: External ear normal.  Nose: Nose normal.  Mouth/Throat: Oropharynx is clear and moist.  Eyes: Conjunctivae are normal. Pupils are equal, round, and reactive to light.  Neck: Normal range of motion. Neck supple.  Cardiovascular: Normal rate, regular rhythm and normal heart sounds.   Pulmonary/Chest: Effort normal and breath sounds normal.  Abdominal: Bowel sounds are normal.  Obese individual with large pannus, indicates some slight distention, tender in LUQ.   Musculoskeletal: Normal range of motion.  Neurological: She is alert and oriented to person, place, and time.  Skin: Skin is warm and dry.    Urgent Care Course     Procedures (including critical care time)  Labs Reviewed - No data to display  Ct Abdomen Pelvis W Contrast  Result Date: 01/11/2017 CLINICAL DATA:  Low abdominal pain for 2 weeks. History of colon cancer. EXAM: CT ABDOMEN AND PELVIS WITH CONTRAST TECHNIQUE: Multidetector CT imaging of the abdomen and pelvis was performed using the standard protocol following bolus administration of intravenous contrast. CONTRAST:  43m ISOVUE-300 IOPAMIDOL (ISOVUE-300) INJECTION 61% COMPARISON:  12/28/2013 FINDINGS: Lower chest: Linear density along the lower esophagus is likely luminal contrast. No postoperative changes at seen in this region previously. Hepatobiliary: Hepatic steatosis.Cholecystectomy with normal common  bile duct diameter. Pancreas: Unremarkable. Spleen: Dystrophic calcifications along the splenic capsule. Adrenals/Urinary Tract: Negative adrenals. Severe left renal atrophy. There is chronic hydronephrosis with 21 mm UPJ stone. Is unclear is a stone lodged today congenital UPJ obstruction or if the stone was the primary obstructive process. No right hydronephrosis. Unremarkable bladder. Stomach/Bowel: Status post gastric bypass. No obstruction or inflammatory changes. Postoperative sigmoid colon; history of colon cancer. Appendectomy. Incidental submucosal fat along the transverse segment, descending colon and ileocecal valve. Vascular/Lymphatic: No acute vascular abnormality. No mass or adenopathy. Reproductive:Nearly 3 cm cyst at the left ovary, new from 2014. Other: No ascites or pneumoperitoneum. Wide necked low abdominal midline hernia with bulging fat and bowel. Musculoskeletal: Chronic L5 pars defects with borderline grade 2 anterolisthesis. Disc bulging and biforaminal L5 impingement. IMPRESSION: 1. No acute finding. 2. 3 cm left ovarian cyst that is new from 2014. Recommend nonemergent pelvic ultrasound. 3. Hepatic steatosis. 4. Wide necked ventral hernia. 5. Chronic left UPJ obstruction with calculus. Severe left renal atrophy. 6. 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 Electronically Signed   By: Monte Fantasia M.D.   On: 01/11/2017 15:18    MDM   1. Left upper quadrant pain     Patient presenting with 3 weeks of  left upper quadrant pain significant history of multiple surgeries including cholecystectomy, appendectomy, sigmoidectomy and gastric bypass. States that she is eating well, no nausea/vomiting, no fevers. Recent CT abdomen on January 11, 2017 that any acute findings. Differential diagnosis including splenic flexure syndrome versus pancreatitis and peptic ulcer disease. Unlikely pancreatiti, PUD given patient denies any association with food intake or  nausea. Abdominal x-ray consistent with stool burden. We will provide patient with bowel movement regiment and pain control with bentyl and tramadol   Armanie Ullmer Cletis Media, MD 01/14/17 1149

## 2017-01-16 ENCOUNTER — Ambulatory Visit: Payer: Medicare Other | Attending: Internal Medicine | Admitting: Internal Medicine

## 2017-01-16 DIAGNOSIS — I10 Essential (primary) hypertension: Secondary | ICD-10-CM

## 2017-01-16 DIAGNOSIS — Z79899 Other long term (current) drug therapy: Secondary | ICD-10-CM | POA: Diagnosis not present

## 2017-01-16 DIAGNOSIS — N200 Calculus of kidney: Secondary | ICD-10-CM | POA: Diagnosis not present

## 2017-01-16 DIAGNOSIS — R1030 Lower abdominal pain, unspecified: Secondary | ICD-10-CM | POA: Insufficient documentation

## 2017-01-16 DIAGNOSIS — E119 Type 2 diabetes mellitus without complications: Secondary | ICD-10-CM

## 2017-01-16 MED ORDER — ACETAMINOPHEN-CODEINE #3 300-30 MG PO TABS: 1.0000 | ORAL_TABLET | ORAL | 0 refills | Status: DC | PRN

## 2017-01-16 MED FILL — ACETAMINOPHEN/COD #3 TABLET: 300-30 | 10 days supply | Qty: 60 | Fill #0

## 2017-01-16 NOTE — Progress Notes (Signed)
S:    Patient comes to clinic for diabetes follow up but reports severe left abdominal pain despite use of tramadol and dicyclomine. Would like to be evaluated for this today and to focus on diabetes at another time. Discussed with Dr. Doreene Burke and she will be switched to his schedule.  O: CBGs fasting: 119-228 (most <160)  Physical Exam  Constitutional: She is oriented to person, place, and time. She appears well-developed and well-nourished.  HENT:  Head: Normocephalic and atraumatic.  Eyes: Conjunctivae and EOM are normal. Pupils are equal, round, and reactive to light.  Neck: Normal range of motion. Neck supple.  Cardiovascular: Normal rate, regular rhythm, normal heart sounds and intact distal pulses.  Exam reveals no gallop and no friction rub.   No murmur heard. Abdominal: Soft. Bowel sounds are normal.  Mild left sided tenderness with no rebound, voluntary guarding. No mass palpable.  Musculoskeletal: Normal range of motion.  Neurological: She is alert and oriented to person, place, and time. No cranial nerve deficit. Coordination normal.  Skin: Skin is warm and dry. No erythema. No pallor.  Psychiatric: She has a normal mood and affect.   ROS  Assessment & Plan   1. Lower abdominal pain  - acetaminophen-codeine (TYLENOL #3) 300-30 MG tablet; Take 1 tablet by mouth every 4 (four) hours as needed.  Dispense: 60 tablet; Refill: 0   - Possibility of pain due to gas was discussed especially because pain tends to move around from previously right lower quadrant to now left upper quadrant. CT abd/pelvis of 01/11/2017 showed no acute finding and XRay Abd of 01/14/2017 showed no acute finding either. She was advised to try Probiotics and/or Bentyl. To return immediately if symptom worsens. Her Left UPJ Kidney stone is stable over the months. Advised liberal oral fluid intake.  2. Essential hypertension  We have discussed target BP range and blood pressure goal. I have advised  patient to check BP regularly and to call us back or report to clinic if the numbers are consistently higher than 140/90. We discussed the importance of compliance with medical therapy and DASH diet recommended, consequences of uncontrolled hypertension discussed.  - continue current BP medications  3. Type 2 diabetes mellitus without complication, unspecified long term insulin use status (HCC)  Longstanding diabetes type 2, still uncontrolled based on A1c and home CBGs. Patient more concerned about pain at this time and would like to see Dr. Doreene Burke. Patient scheduled with him for today and she can follow up with me for diabetes. Will likely need insulin titration at that visit.  Patient have been counseled extensively about nutrition and exercise. Other issues discussed during this visit include: low cholesterol diet, weight control and daily exercise, foot care, annual eye examinations at Ophthal[facility], importance of adherence with medications and regular follow-up. We also discussed long term complications of uncontrolled diabetes and hypertension.   Return in about 3 months (around 04/18/2017) for Hemoglobin A1C and Follow up, DM, Follow up Pain and comorbidities.  The patient was given clear instructions to go to ER or return to medical center if symptoms don't improve, worsen or new problems develop. The patient verbalized understanding. The patient was told to call to get lab results if they haven't heard anything in the next week.   This note has been created with Surveyor, quantity. Any transcriptional errors are unintentional.    JEGEDE, OLUGBEMIGA, MD, MHA, [place], [place], [place] and [facility]  [place], [place]   01/17/2017, 12:30 PM

## 2017-01-16 NOTE — Patient Instructions (Signed)

## 2017-02-05 ENCOUNTER — Ambulatory Visit (INDEPENDENT_AMBULATORY_CARE_PROVIDER_SITE_OTHER): Payer: Medicare Other | Admitting: Gastroenterology

## 2017-02-05 ENCOUNTER — Encounter: Payer: Self-pay | Admitting: Gastroenterology

## 2017-02-05 ENCOUNTER — Encounter (INDEPENDENT_AMBULATORY_CARE_PROVIDER_SITE_OTHER): Payer: Self-pay

## 2017-02-05 VITALS — BP 140/84 | HR 76 | Ht 67.0 in | Wt 239.0 lb

## 2017-02-05 DIAGNOSIS — Z85038 Personal history of other malignant neoplasm of large intestine: Secondary | ICD-10-CM

## 2017-02-05 DIAGNOSIS — R1012 Left upper quadrant pain: Secondary | ICD-10-CM | POA: Diagnosis not present

## 2017-02-05 NOTE — Patient Instructions (Signed)
If you are age 68 or older, your body mass index should be between 23-30. Your Body mass index is 37.43 kg/m. If this is out of the aforementioned range listed, please consider follow up with your Primary Care Provider.  If you are age 18 or younger, your body mass index should be between 19-25. Your Body mass index is 37.43 kg/m. If this is out of the aformentioned range listed, please consider follow up with your Primary Care Provider.   It has been recommended to you by your physician that you have a(n) colonoscopy completed. Per your request, we did not schedule the procedure(s) today. Please contact our office at [phone] should you decide to have the procedure completed.  Thank you for choosing [facility]  Dr Wilfrid Lund III

## 2017-02-05 NOTE — Progress Notes (Signed)
[facility] Consult Note:  History: Catherine Fowler 02/05/2017  Referring physician: Angelica Chessman, MD  Reason for consult/chief complaint: Abdominal Pain   Subjective  HPI:  This is a 66 year old woman referred by primary care or persistent left upper quadrant pain. She is a somewhat tangential historian. Also, much of her care was received at the [facility]. I was able to access some of it through care everywhere. She reports 2 months of a constant left upper quadrant pain that is not radiating, nor changed with eating position time of day or bowel movements. She brought me a list of medicines that she has tried with this pain over the last 5 weeks. They include BuSpar, tramadol, dicyclomine, MiraLAX, probiotics, Tylenol 3, Tylenol, Vicodin. She says all of them "did nothing". She denies change in bowel habits or rectal bleeding. She is concerned that she had similar pain when she was diagnosed with colon cancer in 2016, but understands that is not likely to be the cause at this point since she does not have the same digestive symptoms and she recently had a CT scan.  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 It was T2 N0 staging after resection. It sounds that she had prolonged wound healing. In March 2016 a follow-up colonoscopy revealed 3 tubular adenomas. She had continued problems with her wound and in November 2016 had a surgery for suspected infected mesh that she says turned out to be a reaction to sutures with infection. It should be noted that her recent hemoglobin A1c was over 12, and was 11.5 several months prior.  She reports increased stress from her personal life but also says this pain is worsening her stress and these are the reasons her blood sugars is under poor control.  ROS:  Review of Systems    Constitutional: Negative for appetite change and unexpected weight change.  HENT: Negative for mouth sores and voice change.   Eyes: Negative for pain and redness.  Respiratory: Negative for cough and shortness of breath.   Cardiovascular: Negative for chest pain and palpitations.  Genitourinary: Negative for dysuria and hematuria.  Musculoskeletal: Negative for arthralgias and myalgias.  Skin: Negative for pallor and rash.  Neurological: Negative for weakness and headaches.  Hematological: Negative for adenopathy.     Past Medical History: Past Medical History:  Diagnosis Date  . Chronic kidney disease   . Colon cancer ([place]) 09/18/13  . Diabetes mellitus      Past Surgical History: Past Surgical History:  Procedure Laterality Date  . ABDOMINAL SURGERY    . APPENDECTOMY    . BREAST SURGERY  2/97   breast reduction   . CHOLECYSTECTOMY    . GASTRIC BYPASS  10/16/2004  . HERNIA REPAIR    . RHINOPLASTY    . TUBAL LIGATION    . VENTRAL HERNIA REPAIR  06/17/2012   Procedure: HERNIA REPAIR VENTRAL ADULT;  Surgeon: Adin Hector, MD;  Location: [facility];  Service: General;  Laterality: N/A;     Family History: Family History  Problem Relation Age of Onset  . Diabetes type II Father   . Diabetes type II Other   . Diabetes type II Sister   . Diabetes type II Maternal Aunt     Social History: Social History   Social History  . Marital status: Widowed    Spouse name:  N/A  . Number of children: N/A  . Years of education: N/A   Social History Main Topics  . Smoking status: Never Smoker  . Smokeless tobacco: Never Used  . Alcohol use No  . Drug use: No  . Sexual activity: Not Currently   Other Topics Concern  . None   Social History Narrative  . None    Allergies: Allergies  Allergen Reactions  . Pravastatin Sodium Itching and Rash  . Simvastatin Rash    Outpatient Meds: Current Outpatient Prescriptions  Medication Sig Dispense Refill  . fenofibrate  (TRICOR) 145 MG tablet Take 1 tablet (145 mg total) by mouth daily. 90 tablet 3  . glipiZIDE (GLUCOTROL) 10 MG tablet Take 1 tablet (10 mg total) by mouth 2 (two) times daily before a meal. 180 tablet 3  . glucose blood (WAVESENSE PRESTO) test strip Use as instructed 100 each 12  . glucose monitoring kit (FREESTYLE) monitoring kit 1 each by Does not apply route 4 (four) times daily - after meals and at bedtime. 1 month Diabetic Testing Supplies for QAC-QHS accuchecks. Any brand okay 1 each 1  . Insulin Glargine (LANTUS SOLOSTAR) 100 UNIT/ML Solostar Pen Inject 50 Units into the skin 2 (two) times daily. 30 mL 3  . nystatin (MYCOSTATIN/NYSTOP) powder Apply topically 4 (four) times daily. Apply to Affected area 4 times daily 45 g 1   No current facility-administered medications for this visit.       ___________________________________________________________________ Objective   Exam:  BP 140/84   Pulse 76   Ht '5\' 7"'  (1.702 m)   Wt 239 lb (108.4 kg)   SpO2 98%   BMI 37.43 kg/m    General: this is a(n) Well-appearing woman   Eyes: sclera anicteric, no redness  ENT: oral mucosa moist without lesions, no cervical or supraclavicular lymphadenopathy, good dentition  CV: RRR without murmur, S1/S2, no JVD, no peripheral edema  Resp: clear to auscultation bilaterally, normal RR and effort noted  GI: soft, focal LUQ tenderness, with active bowel sounds. No guarding or palpable organomegaly noted. She has a ventral hernia  Skin; warm and dry, no rash or jaundice noted  Neuro: awake, alert and oriented x 3. Normal gross motor function and fluent speech  Labs:  01/02/17: Hgb 12.4  (was 11.5 in 12/17)  Radiologic Studies:  See CTAP from 01/11/17. Low abdominal midline hernia with bulging fat and intestine.  Assessment: Encounter Diagnoses  Name Primary?  . LUQ pain Yes  . History of colon cancer     The cause of her pain is unclear. Seems unlikely to be an intrinsic colonic  condition with recent normal imaging. I wonder if she may have recurrent small bowel adhesions especially given her multiple surgeries and protracted healing with subsequent wound infection.  Plan:  I told her the all thing I feel I haven't offers a colonoscopy to rule out a obstructive inflammatory or neoplastic causes in the colon. If not, then she should return to her surgeon to see if they think adhesions may be a possible cause. She asked for some other medicines for this pain, but I don't have anything else to offer, especially after the lengthy list of medicines that were already not helpful.  She would like to confer with her sister and get back to me in the near future the decision about a colonoscopy. She was given our contact information.  Thank you for the courtesy of this consult.  Please call me with any questions or  concerns.  Nelida Meuse III  CC: Angelica Chessman, MD

## 2017-02-12 MED FILL — FENOFIBRATE 145 MG TABLET: 145 | 30 days supply | Qty: 30 | Fill #5

## 2017-02-12 MED FILL — glipiZIDE 10 MG TABS: 10 | 30 days supply | Qty: 60 | Fill #5

## 2017-03-14 MED FILL — glipiZIDE 10 MG TABS: 10 | 30 days supply | Qty: 60 | Fill #6

## 2017-03-14 MED FILL — FENOFIBRATE 145 MG TAB: 145 | 30 days supply | Qty: 30 | Fill #6

## 2017-04-17 ENCOUNTER — Encounter: Payer: Self-pay | Admitting: Internal Medicine

## 2017-04-17 ENCOUNTER — Ambulatory Visit: Payer: Medicare Other | Attending: Internal Medicine | Admitting: Internal Medicine

## 2017-04-17 VITALS — BP 136/63 | HR 75 | Temp 98.1°F | Resp 18 | Ht 67.0 in | Wt 240.0 lb

## 2017-04-17 DIAGNOSIS — Z79899 Other long term (current) drug therapy: Secondary | ICD-10-CM | POA: Insufficient documentation

## 2017-04-17 DIAGNOSIS — Z794 Long term (current) use of insulin: Secondary | ICD-10-CM | POA: Insufficient documentation

## 2017-04-17 DIAGNOSIS — E1122 Type 2 diabetes mellitus with diabetic chronic kidney disease: Secondary | ICD-10-CM | POA: Insufficient documentation

## 2017-04-17 DIAGNOSIS — F329 Major depressive disorder, single episode, unspecified: Secondary | ICD-10-CM | POA: Insufficient documentation

## 2017-04-17 DIAGNOSIS — I129 Hypertensive chronic kidney disease with stage 1 through stage 4 chronic kidney disease, or unspecified chronic kidney disease: Secondary | ICD-10-CM | POA: Insufficient documentation

## 2017-04-17 DIAGNOSIS — N189 Chronic kidney disease, unspecified: Secondary | ICD-10-CM | POA: Diagnosis not present

## 2017-04-17 DIAGNOSIS — E119 Type 2 diabetes mellitus without complications: Secondary | ICD-10-CM | POA: Diagnosis not present

## 2017-04-17 DIAGNOSIS — E785 Hyperlipidemia, unspecified: Secondary | ICD-10-CM | POA: Diagnosis not present

## 2017-04-17 DIAGNOSIS — Z85038 Personal history of other malignant neoplasm of large intestine: Secondary | ICD-10-CM | POA: Diagnosis not present

## 2017-04-17 DIAGNOSIS — E1165 Type 2 diabetes mellitus with hyperglycemia: Secondary | ICD-10-CM | POA: Diagnosis not present

## 2017-04-17 DIAGNOSIS — I10 Essential (primary) hypertension: Secondary | ICD-10-CM

## 2017-04-17 LAB — GLUCOSE, POCT (MANUAL RESULT ENTRY): POC Glucose: 259 mg/dl — AB (ref 70–99)

## 2017-04-17 LAB — POCT GLYCOSYLATED HEMOGLOBIN (HGB A1C): Hemoglobin A1C: 10.4

## 2017-04-17 MED ORDER — GLIPIZIDE 10 MG PO TABS: 10.0000 mg | ORAL_TABLET | Freq: Two times a day (BID) | ORAL | 3 refills | Status: DC

## 2017-04-17 MED ORDER — FENOFIBRATE 145 MG PO TABS: 145.0000 mg | ORAL_TABLET | Freq: Every day | ORAL | 3 refills | Status: DC

## 2017-04-17 MED ORDER — INSULIN GLARGINE 100 UNIT/ML SOLOSTAR PEN: 50.0000 [IU] | PEN_INJECTOR | Freq: Two times a day (BID) | SUBCUTANEOUS | 3 refills | Status: DC

## 2017-04-17 MED FILL — glipiZIDE 10 MG TABS: 10 | 30 days supply | Qty: 60 | Fill #7

## 2017-04-17 MED FILL — FENOFIBRATE 145 MG TAB: 145 | 30 days supply | Qty: 30 | Fill #7

## 2017-04-17 NOTE — Progress Notes (Signed)
Catherine Fowler, is a 66 y.o. female  LKG:[id number]  GUY:[id number]  DOB - 12-Aug-1951  Chief Complaint  Patient presents with  . Diabetes       Subjective:   Catherine Fowler is a 66 y.o. female with history of hypertension, dyslipidemia, major depression and type 2 diabetes mellitus uncontrolled, currently on Lantus insulin and glipizide 10 mg tablet by mouth twice a day heretodayfor a follow up visitof DM and HTN and medication refill. Patient claims she is doing well, she has no new complaint today. She is doing much better with her blood sugar control. She is also trying with exercise regimen. Patient has No headache, No chest pain, No abdominal pain - No Nausea, No new weakness tingling or numbness, No Cough - SOB. She is doing better with her emotions, she feels a lot happier, she denies any suicidal ideation or thought.  No problems updated.  ALLERGIES: Allergies  Allergen Reactions  . Pravastatin Sodium Itching and Rash  . Simvastatin Rash    PAST MEDICAL HISTORY: Past Medical History:  Diagnosis Date  . Chronic kidney disease   . Colon cancer ([place]) 09/18/13  . Diabetes mellitus     MEDICATIONS AT HOME: Prior to Admission medications   Medication Sig Start Date End Date Taking? Authorizing Provider  fenofibrate (TRICOR) 145 MG tablet Take 1 tablet (145 mg total) by mouth daily. 04/17/17  Yes Tresa Garter, MD  glipiZIDE (GLUCOTROL) 10 MG tablet Take 1 tablet (10 mg total) by mouth 2 (two) times daily before a meal. 04/17/17  Yes Kaytee Taliercio E, MD  glucose blood (WAVESENSE PRESTO) test strip Use as instructed 10/02/16  Yes Maricela Kawahara E, MD  glucose monitoring kit (FREESTYLE) monitoring kit 1 each by Does not apply route 4 (four) times daily - after meals and at bedtime. 1 month Diabetic Testing Supplies for QAC-QHS accuchecks. Any brand okay 08/14/13  Yes Thurnell Lose, MD  Insulin Glargine (LANTUS SOLOSTAR) 100 UNIT/ML Solostar Pen Inject 50 Units  into the skin 2 (two) times daily. 04/17/17  Yes Tresa Garter, MD  nystatin (MYCOSTATIN/NYSTOP) powder Apply topically 4 (four) times daily. Apply to Affected area 4 times daily 01/14/17  Yes Mikell, Jeani Sow, MD    Objective:   Vitals:   04/17/17 0929  BP: 136/63  Pulse: 75  Resp: 18  Temp: 98.1 F (36.7 C)  TempSrc: Oral  SpO2: 97%  Weight: 240 lb (108.9 kg)  Height: _0  (1.702 m)   Exam General appearance : Awake, alert, not in any distress. Speech Clear. Not toxic looking, obese HEENT: Atraumatic and Normocephalic, pupils equally reactive to light and accomodation Neck: Supple, no JVD. No cervical lymphadenopathy.  Chest: Good air entry bilaterally, no added sounds  CVS: S1 S2 regular, no murmurs.  Abdomen: Bowel sounds present, Non tender and not distended with no gaurding, rigidity or rebound. Extremities: B/L Lower Ext shows no edema, both legs are warm to touch Neurology: Awake alert, and oriented X 3, CN II-XII intact, Non focal Skin: No Rash  Data Review Lab Results  Component Value Date   HGBA1C 10.4 04/17/2017   HGBA1C 12.4 01/02/2017   HGBA1C 11.5 11/08/2016   The 10-year ASCVD risk score Mikey Bussing DC Jr., et al., 2013) is: 18.5%   Values used to calculate the score:     Age: 4 years     Sex: Female     Is Non-Hispanic African American: No     Diabetic: Yes  Tobacco smoker: No     Systolic Blood Pressure: 301 mmHg     Is BP treated: No     HDL Cholesterol: 31 mg/dL     Total Cholesterol: 199 mg/dL  Assessment & Plan   1. Type 2 diabetes mellitus without complication, without long-term current use of insulin (HCC)  - POCT A1C - Glucose (CBG) - Insulin Glargine (LANTUS SOLOSTAR) 100 UNIT/ML Solostar Pen; Inject 50 Units into the skin 2 (two) times daily.  Dispense: 30 mL; Refill: 3 - glipiZIDE (GLUCOTROL) 10 MG tablet; Take 1 tablet (10 mg total) by mouth 2 (two) times daily before a meal.  Dispense: 180 tablet; Refill: 3  2. Essential  hypertension  We have discussed target BP range and blood pressure goal. I have advised patient to check BP regularly and to call us back or report to clinic if the numbers are consistently higher than 140/90. We discussed the importance of compliance with medical therapy and DASH diet recommended, consequences of uncontrolled hypertension discussed.  - continue current BP medications  3. Dyslipidemia  - fenofibrate (TRICOR) 145 MG tablet; Take 1 tablet (145 mg total) by mouth daily.  Dispense: 90 tablet; Refill: 3  Patient have been counseled extensively about nutrition and exercise. Other issues discussed during this visit include: low cholesterol diet, weight control and daily exercise, foot care, annual eye examinations at Ophthalmology, importance of adherence with medications and regular follow-up. We also discussed long term complications of uncontrolled diabetes and hypertension.   Return in about 3 months (around 07/18/2017) for Hemoglobin A1C and Follow up, DM, Follow up HTN.  The patient was given clear instructions to go to ER or return to medical center if symptoms don't improve, worsen or new problems develop. The patient verbalized understanding. The patient was told to call to get lab results if they haven't heard anything in the next week.   This note has been created with Surveyor, quantity. Any transcriptional errors are unintentional.    Angelica Chessman, MD, [place], Karilyn Cota, [place] and [place], [place]   04/17/2017, 9:58 AM

## 2017-04-17 NOTE — Patient Instructions (Signed)
Diabetes Mellitus and Exercise Exercising regularly is important for your overall health, especially when you have diabetes (diabetes mellitus). Exercising is not only about losing weight. It has many health benefits, such as increasing muscle strength and bone density and reducing body fat and stress. This leads to improved fitness, flexibility, and endurance, all of which result in better overall health. Exercise has additional benefits for people with diabetes, including:  Reducing appetite.  Helping to lower and control blood glucose.  Lowering blood pressure.  Helping to control amounts of fatty substances (lipids) in the blood, such as cholesterol and triglycerides.  Helping the body to respond better to insulin (improving insulin sensitivity).  Reducing how much insulin the body needs.  Decreasing the risk for heart disease by: ? Lowering cholesterol and triglyceride levels. ? Increasing the levels of good cholesterol. ? Lowering blood glucose levels.  What is my activity plan? Your health care provider or certified diabetes educator can help you make a plan for the type and frequency of exercise (activity plan) that works for you. Make sure that you:  Do at least 150 minutes of moderate-intensity or vigorous-intensity exercise each week. This could be brisk walking, biking, or water aerobics. ? Do stretching and strength exercises, such as yoga or weightlifting, at least 2 times a week. ? Spread out your activity over at least 3 days of the week.  Get some form of physical activity every day. ? Do not go more than 2 days in a row without some kind of physical activity. ? Avoid being inactive for more than 90 minutes at a time. Take frequent breaks to walk or stretch.  Choose a type of exercise or activity that you enjoy, and set realistic goals.  Start slowly, and gradually increase the intensity of your exercise over time.  What do I need to know about managing my  diabetes?  Check your blood glucose before and after exercising. ? If your blood glucose is higher than 240 mg/dL (13.3 mmol/L) before you exercise, check your urine for ketones. If you have ketones in your urine, do not exercise until your blood glucose returns to normal.  Know the symptoms of low blood glucose (hypoglycemia) and how to treat it. Your risk for hypoglycemia increases during and after exercise. Common symptoms of hypoglycemia can include: ? Hunger. ? Anxiety. ? Sweating and feeling clammy. ? Confusion. ? Dizziness or feeling light-headed. ? Increased heart rate or palpitations. ? Blurry vision. ? Tingling or numbness around the mouth, lips, or tongue. ? Tremors or shakes. ? Irritability.  Keep a rapid-acting carbohydrate snack available before, during, and after exercise to help prevent or treat hypoglycemia.  Avoid injecting insulin into areas of the body that are going to be exercised. For example, avoid injecting insulin into: ? The arms, when playing tennis. ? The legs, when jogging.  Keep records of your exercise habits. Doing this can help you and your health care provider adjust your diabetes management plan as needed. Write down: ? Food that you eat before and after you exercise. ? Blood glucose levels before and after you exercise. ? The type and amount of exercise you have done. ? When your insulin is expected to peak, if you use insulin. Avoid exercising at times when your insulin is peaking.  When you start a new exercise or activity, work with your health care provider to make sure the activity is safe for you, and to adjust your insulin, medicines, or food intake as needed.    Drink plenty of water while you exercise to prevent dehydration or heat stroke. Drink enough fluid to keep your urine clear or pale yellow. This information is not intended to replace advice given to you by your health care provider. Make sure you discuss any questions you have with  your health care provider. Document Released: 01/19/2004 Document Revised: 05/18/2016 Document Reviewed: 04/09/2016 Elsevier Interactive Patient Education  2018 [employer]. Carbohydrate Counting for Diabetes Mellitus, Adult Carbohydrate counting is a method for keeping track of how many carbohydrates you eat. Eating carbohydrates naturally increases the amount of sugar (glucose) in the blood. Counting how many carbohydrates you eat helps keep your blood glucose within normal limits, which helps you manage your diabetes (diabetes mellitus). It is important to know how many carbohydrates you can safely have in each meal. This is different for every person. A diet and nutrition specialist (registered dietitian) can help you make a meal plan and calculate how many carbohydrates you should have at each meal and snack. Carbohydrates are found in the following foods:  Grains, such as breads and cereals.  Dried beans and soy products.  Starchy vegetables, such as potatoes, peas, and corn.  Fruit and fruit juices.  Milk and yogurt.  Sweets and snack foods, such as cake, cookies, candy, chips, and soft drinks.  How do I count carbohydrates? There are two ways to count carbohydrates in food. You can use either of the methods or a combination of both. Reading "Nutrition Facts" on packaged food The "Nutrition Facts" list is included on the labels of almost all packaged foods and beverages in the [place] It includes:  The serving size.  Information about nutrients in each serving, including the grams (g) of carbohydrate per serving.  To use the "Nutrition Facts":  Decide how many servings you will have.  Multiply the number of servings by the number of carbohydrates per serving.  The resulting number is the total amount of carbohydrates that you will be having.  Learning standard serving sizes of other foods When you eat foods containing carbohydrates that are not packaged or do not include  "Nutrition Facts" on the label, you need to measure the servings in order to count the amount of carbohydrates:  Measure the foods that you will eat with a food scale or measuring cup, if needed.  Decide how many standard-size servings you will eat.  Multiply the number of servings by 15. Most carbohydrate-rich foods have about 15 g of carbohydrates per serving. ? For example, if you eat 8 oz (170 g) of strawberries, you will have eaten 2 servings and 30 g of carbohydrates (2 servings x 15 g = 30 g).  For foods that have more than one food mixed, such as soups and casseroles, you must count the carbohydrates in each food that is included.  The following list contains standard serving sizes of common carbohydrate-rich foods. Each of these servings has about 15 g of carbohydrates:   hamburger bun or  English muffin.   oz (15 mL) syrup.   oz (14 g) jelly.  1 slice of bread.  1 six-inch tortilla.  3 oz (85 g) cooked rice or pasta.  4 oz (113 g) cooked dried beans.  4 oz (113 g) starchy vegetable, such as peas, corn, or potatoes.  4 oz (113 g) hot cereal.  4 oz (113 g) mashed potatoes or  of a large baked potato.  4 oz (113 g) canned or frozen fruit.  4 oz (120 mL)  fruit juice.  4-6 crackers.  6 chicken nuggets.  6 oz (170 g) unsweetened dry cereal.  6 oz (170 g) plain fat-free yogurt or yogurt sweetened with artificial sweeteners.  8 oz (240 mL) milk.  8 oz (170 g) fresh fruit or one small piece of fruit.  24 oz (680 g) popped popcorn.  Example of carbohydrate counting Sample meal  3 oz (85 g) chicken breast.  6 oz (170 g) brown rice.  4 oz (113 g) corn.  8 oz (240 mL) milk.  8 oz (170 g) strawberries with sugar-free whipped topping. Carbohydrate calculation 1. Identify the foods that contain carbohydrates: ? Rice. ? Corn. ? Milk. ? Strawberries. 2. Calculate how many servings you have of each food: ? 2 servings rice. ? 1 serving corn. ? 1  serving milk. ? 1 serving strawberries. 3. Multiply each number of servings by 15 g: ? 2 servings rice x 15 g = 30 g. ? 1 serving corn x 15 g = 15 g. ? 1 serving milk x 15 g = 15 g. ? 1 serving strawberries x 15 g = 15 g. 4. Add together all of the amounts to find the total grams of carbohydrates eaten: ? 30 g + 15 g + 15 g + 15 g = 75 g of carbohydrates total. This information is not intended to replace advice given to you by your health care provider. Make sure you discuss any questions you have with your health care provider. Document Released: 10/29/2005 Document Revised: 05/18/2016 Document Reviewed: 04/11/2016 Elsevier Interactive Patient Education  2018 [employer]. Blood Glucose Monitoring, Adult Monitoring your blood sugar (glucose) helps you manage your diabetes. It also helps you and your health care provider determine how well your diabetes management plan is working. Blood glucose monitoring involves checking your blood glucose as often as directed, and keeping a record (log) of your results over time. Why should I monitor my blood glucose? Checking your blood glucose regularly can:  Help you understand how food, exercise, illnesses, and medicines affect your blood glucose.  Let you know what your blood glucose is at any time. You can quickly tell if you are having low blood glucose (hypoglycemia) or high blood glucose (hyperglycemia).  Help you and your health care provider adjust your medicines as needed.  When should I check my blood glucose? Follow instructions from your health care provider about how often to check your blood glucose. This may depend on:  The type of diabetes you have.  How well-controlled your diabetes is.  Medicines you are taking.  If you have type 1 diabetes:  Check your blood glucose at least 2 times a day.  Also check your blood glucose: ? Before every insulin injection. ? Before and after exercise. ? Between meals. ? 2 hours after a  meal. ? Occasionally between 2:00 a.m. and 3:00 a.m., as directed. ? Before potentially dangerous tasks, like driving or using heavy machinery. ? At bedtime.  You may need to check your blood glucose more often, up to 6-10 times a day: ? If you use an insulin pump. ? If you need multiple daily injections (MDI). ? If your diabetes is not well-controlled. ? If you are ill. ? If you have a history of severe hypoglycemia. ? If you have a history of not knowing when your blood glucose is getting low (hypoglycemia unawareness). If you have type 2 diabetes:  If you take insulin or other diabetes medicines, check your blood glucose at least  2 times a day.  If you are on intensive insulin therapy, check your blood glucose at least 4 times a day. Occasionally, you may also need to check between 2:00 a.m. and 3:00 a.m., as directed.  Also check your blood glucose: ? Before and after exercise. ? Before potentially dangerous tasks, like driving or using heavy machinery.  You may need to check your blood glucose more often if: ? Your medicine is being adjusted. ? Your diabetes is not well-controlled. ? You are ill. What is a blood glucose log?  A blood glucose log is a record of your blood glucose readings. It helps you and your health care provider: ? Look for patterns in your blood glucose over time. ? Adjust your diabetes management plan as needed.  Every time you check your blood glucose, write down your result and notes about things that may be affecting your blood glucose, such as your diet and exercise for the day.  Most glucose meters store a record of glucose readings in the meter. Some meters allow you to download your records to a computer. How do I check my blood glucose? Follow these steps to get accurate readings of your blood glucose: Supplies needed   Blood glucose meter.  Test strips for your meter. Each meter has its own strips. You must use the strips that come with  your meter.  A needle to prick your finger (lancet). Do not use lancets more than once.  A device that holds the lancet (lancing device).  A journal or log book to write down your results. Procedure  Wash your hands with soap and water.  Prick the side of your finger (not the tip) with the lancet. Use a different finger each time.  Gently rub the finger until a small drop of blood appears.  Follow instructions that come with your meter for inserting the test strip, applying blood to the strip, and using your blood glucose meter.  Write down your result and any notes. Alternative testing sites  Some meters allow you to use areas of your body other than your finger (alternative sites) to test your blood.  If you think you may have hypoglycemia, or if you have hypoglycemia unawareness, do not use alternative sites. Use your finger instead.  Alternative sites may not be as accurate as the fingers, because blood flow is slower in these areas. This means that the result you get may be delayed, and it may be different from the result that you would get from your finger.  The most common alternative sites are: ? Forearm. ? Thigh. ? Palm of the hand. Additional tips  Always keep your supplies with you.  If you have questions or need help, all blood glucose meters have a 24-hour "hotline" number that you can call. You may also contact your health care provider.  After you use a few boxes of test strips, adjust (calibrate) your blood glucose meter by following instructions that came with your meter. This information is not intended to replace advice given to you by your health care provider. Make sure you discuss any questions you have with your health care provider. Document Released: 11/01/2003 Document Revised: 05/18/2016 Document Reviewed: 04/09/2016 Elsevier Interactive Patient Education  2017 [employer].

## 2017-04-17 NOTE — Progress Notes (Signed)
Patient is here for FU DM  Patient denies pain at this time.  Patient has taken medication today. Patient has eaten today.   

## 2017-04-26 ENCOUNTER — Emergency Department (HOSPITAL_COMMUNITY)
Admission: EM | Admit: 2017-04-26 | Discharge: 2017-04-26 | Disposition: A | Discharge status: Home / Self Care | Payer: Medicare Other | Attending: Emergency Medicine | Admitting: Emergency Medicine

## 2017-04-26 ENCOUNTER — Encounter (HOSPITAL_COMMUNITY): Payer: Self-pay | Admitting: Emergency Medicine

## 2017-04-26 ENCOUNTER — Emergency Department (HOSPITAL_COMMUNITY): Payer: Medicare Other

## 2017-04-26 DIAGNOSIS — N130 Hydronephrosis with ureteropelvic junction obstruction: Secondary | ICD-10-CM | POA: Diagnosis not present

## 2017-04-26 DIAGNOSIS — E119 Type 2 diabetes mellitus without complications: Secondary | ICD-10-CM | POA: Insufficient documentation

## 2017-04-26 DIAGNOSIS — N3000 Acute cystitis without hematuria: Secondary | ICD-10-CM | POA: Insufficient documentation

## 2017-04-26 DIAGNOSIS — Z85038 Personal history of other malignant neoplasm of large intestine: Secondary | ICD-10-CM | POA: Diagnosis not present

## 2017-04-26 DIAGNOSIS — N83202 Unspecified ovarian cyst, left side: Secondary | ICD-10-CM | POA: Insufficient documentation

## 2017-04-26 DIAGNOSIS — N135 Crossing vessel and stricture of ureter without hydronephrosis: Secondary | ICD-10-CM

## 2017-04-26 DIAGNOSIS — Z79899 Other long term (current) drug therapy: Secondary | ICD-10-CM | POA: Diagnosis not present

## 2017-04-26 DIAGNOSIS — N189 Chronic kidney disease, unspecified: Secondary | ICD-10-CM | POA: Diagnosis not present

## 2017-04-26 DIAGNOSIS — Z794 Long term (current) use of insulin: Secondary | ICD-10-CM | POA: Insufficient documentation

## 2017-04-26 DIAGNOSIS — R109 Unspecified abdominal pain: Secondary | ICD-10-CM | POA: Diagnosis present

## 2017-04-26 DIAGNOSIS — N200 Calculus of kidney: Secondary | ICD-10-CM | POA: Diagnosis not present

## 2017-04-26 DIAGNOSIS — N3001 Acute cystitis with hematuria: Secondary | ICD-10-CM | POA: Diagnosis not present

## 2017-04-26 DIAGNOSIS — I129 Hypertensive chronic kidney disease with stage 1 through stage 4 chronic kidney disease, or unspecified chronic kidney disease: Secondary | ICD-10-CM | POA: Insufficient documentation

## 2017-04-26 LAB — URINALYSIS, ROUTINE W REFLEX MICROSCOPIC
BILIRUBIN URINE: NEGATIVE
Glucose, UA: 50 mg/dL — AB
KETONES UR: NEGATIVE mg/dL
NITRITE: NEGATIVE
PROTEIN: 100 mg/dL — AB
Specific Gravity, Urine: 1.024 (ref 1.005–1.030)
Squamous Epithelial / LPF: NONE SEEN
pH: 5 (ref 5.0–8.0)

## 2017-04-26 LAB — CBC
HEMATOCRIT: 41.1 % (ref 36.0–46.0)
HEMOGLOBIN: 13.5 g/dL (ref 12.0–15.0)
MCH: 28 pg (ref 26.0–34.0)
MCHC: 32.8 g/dL (ref 30.0–36.0)
MCV: 85.1 fL (ref 78.0–100.0)
Platelets: 244 10*3/uL (ref 150–400)
RBC: 4.83 MIL/uL (ref 3.87–5.11)
RDW: 14.1 % (ref 11.5–15.5)
WBC: 10.8 10*3/uL — ABNORMAL HIGH (ref 4.0–10.5)

## 2017-04-26 LAB — COMPREHENSIVE METABOLIC PANEL
ALBUMIN: 4 g/dL (ref 3.5–5.0)
ALT: 34 U/L (ref 14–54)
ANION GAP: 9 (ref 5–15)
AST: 39 U/L (ref 15–41)
Alkaline Phosphatase: 97 U/L (ref 38–126)
BILIRUBIN TOTAL: 0.8 mg/dL (ref 0.3–1.2)
BUN: 14 mg/dL (ref 6–20)
CO2: 28 mmol/L (ref 22–32)
Calcium: 9.1 mg/dL (ref 8.9–10.3)
Chloride: 103 mmol/L (ref 101–111)
Creatinine, Ser: 1.41 mg/dL — ABNORMAL HIGH (ref 0.44–1.00)
GFR calc Af Amer: 43 mL/min — ABNORMAL LOW (ref >60)
GFR calc non Af Amer: 37 mL/min — ABNORMAL LOW (ref >60)
Glucose, Bld: 132 mg/dL — ABNORMAL HIGH (ref 65–99)
POTASSIUM: 3.8 mmol/L (ref 3.5–5.1)
SODIUM: 140 mmol/L (ref 135–145)
TOTAL PROTEIN: 7.4 g/dL (ref 6.5–8.1)

## 2017-04-26 LAB — LIPASE, BLOOD: LIPASE: 31 U/L (ref 11–51)

## 2017-04-26 MED ORDER — HYDROCODONE-ACETAMINOPHEN 5-325 MG PO TABS
1.0000 | ORAL_TABLET | ORAL | 0 refills | Status: DC | PRN
Start: 2017-04-26 — End: 2017-04-29

## 2017-04-26 MED ORDER — DEXTROSE 5 % IV SOLN: 1.0000 g | Freq: Once | INTRAVENOUS | Status: DC

## 2017-04-26 MED ORDER — SODIUM CHLORIDE 0.9 % IV BOLUS (SEPSIS)
1000.0000 mL | Freq: Once | INTRAVENOUS | Status: AC
  Administered 2017-04-26: 1000 mL via INTRAVENOUS

## 2017-04-26 MED ORDER — SODIUM CHLORIDE 0.9 % IV SOLN
1000.0000 mL | INTRAVENOUS | Status: DC
  Administered 2017-04-26: 1000 mL via INTRAVENOUS

## 2017-04-26 MED ORDER — MORPHINE SULFATE (PF) 2 MG/ML IV SOLN
4.0000 mg | Freq: Once | INTRAVENOUS | Status: AC
  Administered 2017-04-26: 4 mg via INTRAVENOUS
  Filled 2017-04-26: qty 2

## 2017-04-26 MED ORDER — CEPHALEXIN 500 MG PO CAPS: 500.0000 mg | ORAL_CAPSULE | Freq: Three times a day (TID) | ORAL | 0 refills | Status: DC

## 2017-04-26 MED ORDER — CEPHALEXIN 500 MG PO CAPS
500.0000 mg | ORAL_CAPSULE | Freq: Once | ORAL | Status: AC
  Administered 2017-04-26: 500 mg via ORAL
  Filled 2017-04-26: qty 1

## 2017-04-26 NOTE — Discharge Instructions (Signed)
Follow up with a urologist for your UTI and evaluation of the chronic hydronephrosis, follow up with an [place] doctor to have an outpatient ultrasound to evaluate your ovarian cyst (your primary care doctor could also order the ultrasound and then have you see an [place] doctor, take the medications as needed for pain

## 2017-04-26 NOTE — ED Provider Notes (Signed)
[facility] DEPT Provider Note   CSN: [id number] Arrival date & time: 04/26/17  1150     History   Chief Complaint Chief Complaint  Patient presents with  . Abdominal Pain    HPI Catherine Fowler is a 66 y.o. female.  HPI Pt presents to the ED with extreme addominal pain.  Pt has had chronic abdominal pain issues previously.  She had colon ca treated with sigmoidectomy.  She had an episode of extreme pain in the abdomen down to the llq in February.  She saw her PCP had an outpatient CT scan and was given medications for pain. Her symptoms did not improve.  It migrated toward the side.  She saw her PCP several times.   She was referred to GI, Dr Loletha Carrow the end of march.  She was told that she could have a colonoscopy but the cause of her pain was not clear.  Pt was told she could have a colonoscopy but it would not likely tell her the cause.  It sounds like pt was upset about the interaction.  She states she took notes of the visit and wrote down a record of all of the discussion immediately after the visit in the parking lot.  Two days later, the  pt states her pain resolved spontaneously.  She was symptom free for two months after that.  Only in the last couple of days did the pain return.  The pain aches constantly and every few minutes she gets a sharp stabbing pain.  She had another episode today.   It is very severe in the left flank.  She also notes urinary frequency and urgency.  No fever.  No vomiting.  No constipation. Past Medical History:  Diagnosis Date  . Chronic kidney disease   . Colon cancer ([place]) 09/18/13  . Diabetes mellitus     Patient Active Problem List   Diagnosis Date Noted  . Lower abdominal pain 01/16/2017  . Breast cancer screening 11/08/2016  . Depression (emotion) 07/21/2015  . Diabetes with skin complication ([place]) 79/12/4095  . Type 2 diabetes mellitus with hyperglycemia ([place]) 04/21/2015  . Vaginitis and vulvovaginitis 04/07/2015  . Other specified  diabetes mellitus without complications ([place]) 35/32/9924  . Type 2 diabetes mellitus without complication ([place]) 26/83/4196  . History of colon cancer, no staging 09/09/2014  . Dyslipidemia 06/07/2014  . Essential hypertension 06/07/2014  . Colon cancer ([place]) 01/12/2014  . Preventative health care 09/04/2013  . Anemia, iron deficiency 07/29/2013  . DM (diabetes mellitus), type 2, uncontrolled ([place]) 09/24/2011  . HTN (hypertension) 09/24/2011    Past Surgical History:  Procedure Laterality Date  . ABDOMINAL SURGERY    . APPENDECTOMY    . BREAST SURGERY  2/97   breast reduction   . CHOLECYSTECTOMY    . GASTRIC BYPASS  10/16/2004  . HERNIA REPAIR    . RHINOPLASTY    . TUBAL LIGATION    . VENTRAL HERNIA REPAIR  06/17/2012   Procedure: HERNIA REPAIR VENTRAL ADULT;  Surgeon: Adin Hector, MD;  Location: [facility];  Service: General;  Laterality: N/A;    OB History    No data available       Home Medications    Prior to Admission medications   Medication Sig Start Date End Date Taking? Authorizing Provider  acetaminophen-codeine (TYLENOL #3) 300-30 MG tablet Take 2 tablets by mouth every 4 (four) hours as needed for moderate pain.   Yes [provider]  fenofibrate (TRICOR) 145  MG tablet Take 1 tablet (145 mg total) by mouth daily. 04/17/17  Yes Tresa Garter, MD  glipiZIDE (GLUCOTROL) 10 MG tablet Take 1 tablet (10 mg total) by mouth 2 (two) times daily before a meal. 04/17/17  Yes Jegede, Olugbemiga E, MD  Insulin Glargine (LANTUS SOLOSTAR) 100 UNIT/ML Solostar Pen Inject 50 Units into the skin 2 (two) times daily. 04/17/17  Yes Tresa Garter, MD  traMADol (ULTRAM) 50 MG tablet Take 100 mg by mouth every 6 (six) hours as needed.   Yes [provider]  cephALEXin (KEFLEX) 500 MG capsule Take 1 capsule (500 mg total) by mouth 3 (three) times daily. 04/26/17   Dorie Rank, MD  glucose blood (WAVESENSE PRESTO) test strip Use as instructed 10/02/16   Tresa Garter, MD  glucose monitoring kit (FREESTYLE) monitoring kit 1 each by Does not apply route 4 (four) times daily - after meals and at bedtime. 1 month Diabetic Testing Supplies for QAC-QHS accuchecks. Any brand okay 08/14/13   Thurnell Lose, MD  HYDROcodone-acetaminophen (NORCO/VICODIN) 5-325 MG tablet Take 1 tablet by mouth every 4 (four) hours as needed. 04/26/17   Dorie Rank, MD  nystatin (MYCOSTATIN/NYSTOP) powder Apply topically 4 (four) times daily. Apply to Affected area 4 times daily Patient not taking: Reported on 04/26/2017 01/14/17   Tonette Bihari, MD    Family History Family History  Problem Relation Age of Onset  . Diabetes type II Father   . Diabetes type II Other   . Diabetes type II Sister   . Diabetes type II Maternal Aunt     Social History Social History  Substance Use Topics  . Smoking status: Never Smoker  . Smokeless tobacco: Never Used  . Alcohol use No     Allergies   Pravastatin sodium and Simvastatin   Review of Systems Review of Systems  All other systems reviewed and are negative.    Physical Exam Updated Vital Signs BP (!) 141/62 (BP Location: Left Arm)   Pulse 80   Resp 20   SpO2 94%   Physical Exam  Constitutional:  Obese   HENT:  Head: Normocephalic and atraumatic.  Right Ear: External ear normal.  Left Ear: External ear normal.  Eyes: Conjunctivae are normal. Right eye exhibits no discharge. Left eye exhibits no discharge. No scleral icterus.  Neck: Neck supple. No tracheal deviation present.  Cardiovascular: Normal rate, regular rhythm and intact distal pulses.   Pulmonary/Chest: Effort normal and breath sounds normal. No stridor. No respiratory distress. She has no wheezes. She has no rales.  Abdominal: Soft. Bowel sounds are normal. She exhibits no distension. There is tenderness in the left lower quadrant. There is no rebound and no guarding.  Musculoskeletal: She exhibits no edema or tenderness.  Neurological:  She is alert. She has normal strength. No cranial nerve deficit (no facial droop, extraocular movements intact, no slurred speech) or sensory deficit. She exhibits normal muscle tone. She displays no seizure activity. Coordination normal.  Skin: Skin is warm and dry. No rash noted. She is not diaphoretic.  Psychiatric: She has a normal mood and affect.  Nursing note reviewed.    ED Treatments / Results  Labs (all labs ordered are listed, but only abnormal results are displayed) Labs Reviewed  COMPREHENSIVE METABOLIC PANEL - Abnormal; Notable for the following:       Result Value   Glucose, Bld 132 (*)    Creatinine, Ser 1.41 (*)    GFR calc non  Af Amer 37 (*)    GFR calc Af Amer 43 (*)    All other components within normal limits  CBC - Abnormal; Notable for the following:    WBC 10.8 (*)    All other components within normal limits  URINALYSIS, ROUTINE W REFLEX MICROSCOPIC - Abnormal; Notable for the following:    APPearance CLOUDY (*)    Glucose, UA 50 (*)    Hgb urine dipstick LARGE (*)    Protein, ur 100 (*)    Leukocytes, UA LARGE (*)    Bacteria, UA MANY (*)    All other components within normal limits  URINE CULTURE  LIPASE, BLOOD     Radiology Ct Abdomen Pelvis Wo Contrast  Result Date: 04/26/2017 CLINICAL DATA:  Flank pain. Pt stated she only has one kidney due to kidney stone that was size of golf ball. "Pt c/o LUQ abdominal pain onset February. Has been seen by multiple providers and given multiple treatments without relief. EXAM: CT ABDOMEN AND PELVIS WITHOUT CONTRAST TECHNIQUE: Multidetector CT imaging of the abdomen and pelvis was performed following the standard protocol without IV contrast. COMPARISON:  CT of the abdomen and pelvis 01/11/2017 and earlier exams FINDINGS: Lower chest: No acute abnormality. Hepatobiliary: The liver is diffusely low attenuation consistent with hepatic steatosis. No focal liver lesions are identified. Gallbladder is surgically absent.  Pancreas: Unremarkable. No pancreatic ductal dilatation or surrounding inflammatory changes. Spleen: Normal in size without focal abnormality. Left upper quadrant splenule is present. Adrenals/Urinary Tract: The adrenal glands are normal in appearance. Right kidney is normal in appearance. No intrarenal or ureteral stones are identified. There is chronic left hydronephrosis and a left ureteropelvic junction calculus measuring 17 mm. Distal left ureter is unremarkable in appearance. Urinary bladder is normal in appearance. Stomach/Bowel: Status post gastric bypass surgery. Gastro jejunal anastomosis is unremarkable in appearance. No evidence for obstruction. Numerous small bowel loops are closely apposed to the anterior abdominal wall, raising the question of adhesions. However there is no evidence for small bowel obstruction or bowel wall thickening. Bowel sutures are identified in the region of the lower descending colon/sigmoid colon junction consistent with a history of colon cancer. The colon is otherwise unremarkable in appearance. The appendix is surgically absent. Vascular/Lymphatic: There is mild atherosclerotic calcification of the abdominal aorta. No retroperitoneal or mesenteric adenopathy. Reproductive: The uterus is present. A left adnexal low-attenuation lesion 3.9 x 2.8 cm the, stable in appearance compared prior study. Region of the right ovary is unremarkable. Other: No free pelvic fluid. Anterior abdominal wall laxity and scarring. Possible adhesions to the anterior abdominal wall. 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 No suspicious lytic or blastic lesions are identified. IMPRESSION: 1. Developing hepatic steatosis, new since 2015. 2. Stable appearance of left adnexal cyst since March of 2018. However as this is a new finding compared with prior studies in 2014 in a postmenopausal patient, further characterization  non emergent pelvic ultrasound is recommended. 3. Long-standing obstruction of the left renal pelvis and large ureteropelvic junction calculus. 4. Unremarkable appearance of gastric bypass. 5. Suspect anterior abdominal wall adhesions. Anterior abdominal wall laxity. 6. No bowel obstruction. 7. Stable appearance of L5 pars defects and grade 1 anterolisthesis of L5 on S1. Electronically Signed   By: Nolon Nations M.D.   On: 04/26/2017 15:19    Procedures Procedures (including critical care time)  Medications Ordered in ED Medications  sodium chloride 0.9 %  bolus 1,000 mL (0 mLs Intravenous Stopped 04/26/17 1622)    Followed by  0.9 %  sodium chloride infusion (1,000 mLs Intravenous New Bag/Given 04/26/17 1621)  morphine 2 MG/ML injection 4 mg (4 mg Intravenous Given 04/26/17 1348)  cephALEXin (KEFLEX) capsule 500 mg (500 mg Oral Given 04/26/17 1621)     Initial Impression / Assessment and Plan / ED Course  I have reviewed the triage vital signs and the nursing notes.  Pertinent labs & imaging results that were available during my care of the patient were reviewed by me and considered in my medical decision making (see chart for details).  Clinical Course as of Apr 26 1628  Fri Apr 26, 2017  1315 I reviewed the patient's CT scan back in March. The outpatient CT did show an ovarian cyst. Pelvic ultrasound was recommended. I do not see that the patient had any follow-up imaging of that. 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  It is possible these could be contributing to her chronic pain although they may be unrelated.  I do think it is reasonable for her to see an OB GYN doctor regarding her ovarian cyst (also needs an outpatient [place]) and see a urologist.  Will start her on abx for her uti.   At this time there does not appear to be any evidence of an acute  emergency medical condition and the patient appears stable for discharge with appropriate outpatient follow up.   Final Clinical Impressions(s) / ED Diagnoses   Final diagnoses:  Acute cystitis with hematuria  Cyst of left ovary  Obstruction of left ureteropelvic junction (UPJ)    New Prescriptions New Prescriptions   CEPHALEXIN (KEFLEX) 500 MG CAPSULE    Take 1 capsule (500 mg total) by mouth 3 (three) times daily.   HYDROCODONE-ACETAMINOPHEN (NORCO/VICODIN) 5-325 MG TABLET    Take 1 tablet by mouth every 4 (four) hours as needed.     Dorie Rank, MD 04/26/17 1630

## 2017-04-26 NOTE — ED Triage Notes (Signed)
Pt c/o LUQ abdominal pain onset February. Has been seen by multiple providers and given multiple treatments without relief. Saw Dr. Pincus Sanes at [facility].Marland Kitchen Tylenol 3, Vicoden, Tramadol, Buspar. Acetaminophen helped mildly. Pain resolved spontaneously after 2 weeks but recurred onset yesterday. Hx 1 kidney, cancer.

## 2017-04-28 LAB — URINE CULTURE: Culture: >=100000 — AB

## 2017-04-29 ENCOUNTER — Telehealth: Payer: Self-pay | Admitting: Emergency Medicine

## 2017-04-29 ENCOUNTER — Emergency Department (HOSPITAL_COMMUNITY)
Admission: EM | Admit: 2017-04-29 | Discharge: 2017-04-29 | Disposition: A | Discharge status: Home / Self Care | Payer: Medicare Other | Attending: Emergency Medicine | Admitting: Emergency Medicine

## 2017-04-29 ENCOUNTER — Encounter (HOSPITAL_COMMUNITY): Payer: Self-pay

## 2017-04-29 ENCOUNTER — Emergency Department (HOSPITAL_COMMUNITY): Payer: Medicare Other

## 2017-04-29 ENCOUNTER — Other Ambulatory Visit: Payer: Self-pay | Admitting: Pharmacist

## 2017-04-29 DIAGNOSIS — Z7984 Long term (current) use of oral hypoglycemic drugs: Secondary | ICD-10-CM | POA: Diagnosis not present

## 2017-04-29 DIAGNOSIS — N189 Chronic kidney disease, unspecified: Secondary | ICD-10-CM | POA: Insufficient documentation

## 2017-04-29 DIAGNOSIS — R109 Unspecified abdominal pain: Secondary | ICD-10-CM | POA: Diagnosis not present

## 2017-04-29 DIAGNOSIS — I129 Hypertensive chronic kidney disease with stage 1 through stage 4 chronic kidney disease, or unspecified chronic kidney disease: Secondary | ICD-10-CM | POA: Insufficient documentation

## 2017-04-29 DIAGNOSIS — N83202 Unspecified ovarian cyst, left side: Secondary | ICD-10-CM

## 2017-04-29 DIAGNOSIS — E119 Type 2 diabetes mellitus without complications: Secondary | ICD-10-CM

## 2017-04-29 DIAGNOSIS — N941 Unspecified dyspareunia: Secondary | ICD-10-CM | POA: Diagnosis not present

## 2017-04-29 DIAGNOSIS — Z85038 Personal history of other malignant neoplasm of large intestine: Secondary | ICD-10-CM | POA: Insufficient documentation

## 2017-04-29 DIAGNOSIS — N8302 Follicular cyst of left ovary: Secondary | ICD-10-CM | POA: Insufficient documentation

## 2017-04-29 DIAGNOSIS — R1032 Left lower quadrant pain: Secondary | ICD-10-CM | POA: Diagnosis not present

## 2017-04-29 LAB — URINALYSIS, ROUTINE W REFLEX MICROSCOPIC
BACTERIA UA: NONE SEEN
BILIRUBIN URINE: NEGATIVE
Glucose, UA: >=500 mg/dL — AB
KETONES UR: NEGATIVE mg/dL
Nitrite: NEGATIVE
PH: 5 (ref 5.0–8.0)
Protein, ur: 100 mg/dL — AB
Specific Gravity, Urine: 1.038 — ABNORMAL HIGH (ref 1.005–1.030)

## 2017-04-29 LAB — COMPREHENSIVE METABOLIC PANEL
ALK PHOS: 123 U/L (ref 38–126)
ALT: 43 U/L (ref 14–54)
AST: 41 U/L (ref 15–41)
Albumin: 3 g/dL — ABNORMAL LOW (ref 3.5–5.0)
Anion gap: 10 (ref 5–15)
BILIRUBIN TOTAL: 0.6 mg/dL (ref 0.3–1.2)
BUN: 14 mg/dL (ref 6–20)
CALCIUM: 8.5 mg/dL — AB (ref 8.9–10.3)
CO2: 23 mmol/L (ref 22–32)
CREATININE: 1.71 mg/dL — AB (ref 0.44–1.00)
Chloride: 103 mmol/L (ref 101–111)
GFR, EST AFRICAN AMERICAN: 34 mL/min — AB (ref >60)
GFR, EST NON AFRICAN AMERICAN: 30 mL/min — AB (ref >60)
Glucose, Bld: 206 mg/dL — ABNORMAL HIGH (ref 65–99)
Potassium: 3.4 mmol/L — ABNORMAL LOW (ref 3.5–5.1)
Sodium: 136 mmol/L (ref 135–145)
Total Protein: 6.9 g/dL (ref 6.5–8.1)

## 2017-04-29 LAB — CBC
HCT: 36.3 % (ref 36.0–46.0)
HEMOGLOBIN: 11.9 g/dL — AB (ref 12.0–15.0)
MCH: 27.6 pg (ref 26.0–34.0)
MCHC: 32.8 g/dL (ref 30.0–36.0)
MCV: 84.2 fL (ref 78.0–100.0)
Platelets: 253 10*3/uL (ref 150–400)
RBC: 4.31 MIL/uL (ref 3.87–5.11)
RDW: 14.4 % (ref 11.5–15.5)
WBC: 8.9 10*3/uL (ref 4.0–10.5)

## 2017-04-29 LAB — WET PREP, GENITAL
Clue Cells Wet Prep HPF POC: NONE SEEN
Sperm: NONE SEEN
TRICH WET PREP: NONE SEEN
YEAST WET PREP: NONE SEEN

## 2017-04-29 LAB — CK: Total CK: 96 U/L (ref 38–234)

## 2017-04-29 MED ORDER — SODIUM CHLORIDE 0.9 % IV BOLUS (SEPSIS)
1000.0000 mL | Freq: Once | INTRAVENOUS | Status: AC
Start: 2017-04-29 — End: 2017-04-29
  Administered 2017-04-29: 1000 mL via INTRAVENOUS

## 2017-04-29 MED ORDER — HYDROCODONE-ACETAMINOPHEN 5-325 MG PO TABS
2.0000 | ORAL_TABLET | Freq: Once | ORAL | Status: AC
  Administered 2017-04-29: 2 via ORAL
  Filled 2017-04-29: qty 2

## 2017-04-29 MED ORDER — HYDROCODONE-ACETAMINOPHEN 5-325 MG PO TABS: 2.0000 | ORAL_TABLET | Freq: Four times a day (QID) | ORAL | 0 refills | Status: DC | PRN

## 2017-04-29 MED ORDER — INSULIN GLARGINE 100 UNIT/ML SOLOSTAR PEN: 50.0000 [IU] | PEN_INJECTOR | Freq: Two times a day (BID) | SUBCUTANEOUS | 3 refills | Status: DC

## 2017-04-29 NOTE — ED Notes (Signed)
Zofran 4 mg tab dropped after scanning and another one removed from pyxis.

## 2017-04-29 NOTE — Telephone Encounter (Signed)
Post ED Visit - Positive Culture Follow-up  Culture report reviewed by antimicrobial stewardship pharmacist:  [x]  Elenor Quinones, Pharm.D. []  Heide Guile, Pharm.D., BCPS AQ-ID []  Parks Neptune, Pharm.D., BCPS []  Alycia Rossetti, Pharm.D., BCPS []  Rocky [place], Pharm.D., BCPS, AAHIVP []  Legrand Como, Pharm.D., BCPS, AAHIVP []  Salome Arnt, PharmD, BCPS []  Dimitri Ped, PharmD, BCPS []  Vincenza Hews, PharmD, BCPS  Positive urine culture Treated with cephalexin, organism sensitive to the same and no further patient follow-up is required at this time.  Hazle Nordmann 04/29/2017, 10:36 AM

## 2017-04-29 NOTE — ED Provider Notes (Signed)
[facility] DEPT Provider Note   CSN: [id number] Arrival date & time: 04/29/17  1012     History   Chief Complaint Chief Complaint  Patient presents with  . Flank Pain    Left     HPI Catherine Fowler is a 66 y.o. female with a h/o of colorectal CA s/p sigmoidectomy and chronic left-sided abdominal who presents to the Emergency Department with worsening, severe, non-radiating left flank aching pain with intermittent stabbing pain. She also reports associated urgency and frequency. She reports she was seen for the same symptoms on 6/15, but presents today due to worsening, intractable pain after running out of pain medication at home. Reviewed notes, labs, and imaging from 6/15 visit. She denies N/V/D, constipation, melena, hematochezia, fever, chills, vaginal pain, vaginal itching, dysuria, hematuria, CP, or dyspnea at this time.   She reports a h/o of similar pain that resolved spontaneously around 3/30. She was previously evaluated by Dr. Pincus Sanes with GI on 3/27 who suggested a colonoscopy, but the etiology of the pain was unclear. She reports that she was extremely unhappy with the encounter and has well-documented noted regarding all aspects of the visit.   She also complains of dyspareunia likely due to a new female sex partner that she began seeing about 8 weeks ago. She states that their relationship is monogamous.  The history is provided by the patient. No language interpreter was used.    Past Medical History:  Diagnosis Date  . Chronic kidney disease   . Colon cancer ([place]) 09/18/13  . Diabetes mellitus     Patient Active Problem List   Diagnosis Date Noted  . Lower abdominal pain 01/16/2017  . Breast cancer screening 11/08/2016  . Depression (emotion) 07/21/2015  . Diabetes with skin complication ([place]) 00/86/7619  . Type 2 diabetes mellitus with hyperglycemia ([place]) 04/21/2015  . Vaginitis and vulvovaginitis 04/07/2015  . Other specified diabetes mellitus without  complications ([place]) 50/93/2671  . Type 2 diabetes mellitus without complication ([place]) 24/58/0998  . History of colon cancer, no staging 09/09/2014  . Dyslipidemia 06/07/2014  . Essential hypertension 06/07/2014  . Colon cancer ([place]) 01/12/2014  . Preventative health care 09/04/2013  . Anemia, iron deficiency 07/29/2013  . DM (diabetes mellitus), type 2, uncontrolled ([place]) 09/24/2011  . HTN (hypertension) 09/24/2011    Past Surgical History:  Procedure Laterality Date  . ABDOMINAL SURGERY    . APPENDECTOMY    . BREAST SURGERY  2/97   breast reduction   . CHOLECYSTECTOMY    . GASTRIC BYPASS  10/16/2004  . HERNIA REPAIR    . RHINOPLASTY    . TUBAL LIGATION    . VENTRAL HERNIA REPAIR  06/17/2012   Procedure: HERNIA REPAIR VENTRAL ADULT;  Surgeon: Adin Hector, MD;  Location: [facility];  Service: General;  Laterality: N/A;    OB History    Obstetric Comments   SVD x 1.        Home Medications    Prior to Admission medications   Medication Sig Start Date End Date Taking? Authorizing Provider  cephALEXin (KEFLEX) 500 MG capsule Take 1 capsule (500 mg total) by mouth 3 (three) times daily. 04/26/17  Yes Dorie Rank, MD  fenofibrate (TRICOR) 145 MG tablet Take 1 tablet (145 mg total) by mouth daily. 04/17/17  Yes Tresa Garter, MD  glipiZIDE (GLUCOTROL) 10 MG tablet Take 1 tablet (10 mg total) by mouth 2 (two) times daily before a meal. 04/17/17  Yes Tresa Garter, MD  glucose blood (WAVESENSE PRESTO) test strip Use as instructed 10/02/16   Tresa Garter, MD  glucose monitoring kit (FREESTYLE) monitoring kit 1 each by Does not apply route 4 (four) times daily - after meals and at bedtime. 1 month Diabetic Testing Supplies for QAC-QHS accuchecks. Any brand okay 08/14/13   Thurnell Lose, MD  HYDROcodone-acetaminophen (NORCO/VICODIN) 5-325 MG tablet Take 2 tablets by mouth every 6 (six) hours as needed. 04/29/17   Eliaz Fout A, PA-C  Insulin Glargine (LANTUS  SOLOSTAR) 100 UNIT/ML Solostar Pen Inject 50 Units into the skin 2 (two) times daily. 04/29/17   Tresa Garter, MD    Family History Family History  Problem Relation Age of Onset  . Diabetes type II Father   . Diabetes type II Other   . Diabetes type II Sister   . Diabetes type II Maternal Aunt     Social History Social History  Substance Use Topics  . Smoking status: Never Smoker  . Smokeless tobacco: Never Used  . Alcohol use No     Allergies   Pravastatin sodium and Simvastatin   Review of Systems Review of Systems  Constitutional: Negative for activity change.  Respiratory: Negative for shortness of breath.   Cardiovascular: Negative for chest pain.  Gastrointestinal: Positive for abdominal pain. Negative for diarrhea, nausea and vomiting.  Genitourinary: Positive for flank pain, frequency and urgency. Negative for dysuria.  Musculoskeletal: Negative for back pain.  Skin: Negative for rash.     Physical Exam Updated Vital Signs BP 101/60 (BP Location: Left Arm)   Pulse 79   Temp 100.2 F (37.9 C) (Oral)   Resp 16   SpO2 95%   Physical Exam  Constitutional: No distress.  HENT:  Head: Normocephalic.  Eyes: Conjunctivae are normal.  Neck: Neck supple.  Cardiovascular: Normal rate and regular rhythm.  Exam reveals no gallop and no friction rub.   No murmur heard. Pulmonary/Chest: Effort normal. No respiratory distress.  Abdominal: Soft. She exhibits no distension. There is tenderness.  Protuberant abdomen. Point TTP over the left abdomen near the midline of the upper and lower quadrants.   Genitourinary: There is no rash, tenderness, lesion or injury on the right labia. There is no rash, tenderness, lesion or injury on the left labia. Cervix exhibits no motion tenderness. No tenderness in the vagina.  Genitourinary Comments: Gross blood is noted in the vaginal vault. Thick white discharge noted. Difficult to palpate uterus and ovaries on bimanual exam  due to body habitus. No CMT.   Neurological: She is alert.  Skin: Skin is warm. No rash noted.  Psychiatric: Her behavior is normal.  Nursing note and vitals reviewed.  ED Treatments / Results  Labs (all labs ordered are listed, but only abnormal results are displayed) Labs Reviewed  WET PREP, GENITAL - Abnormal; Notable for the following:       Result Value   WBC, Wet Prep HPF POC MANY (*)    All other components within normal limits  URINALYSIS, ROUTINE W REFLEX MICROSCOPIC - Abnormal; Notable for the following:    Color, Urine DUPLICATE REQUEST (*)    APPearance DUPLICATE REQUEST (*)    Glucose, UA DUPLICATE REQUEST (*)    Hgb urine dipstick DUPLICATE REQUEST (*)    Bilirubin Urine DUPLICATE REQUEST (*)    Ketones, ur DUPLICATE REQUEST (*)    Protein, ur DUPLICATE REQUEST (*)    Nitrite DUPLICATE REQUEST (*)    Leukocytes, UA DUPLICATE REQUEST (*)  All other components within normal limits  URINALYSIS, ROUTINE W REFLEX MICROSCOPIC - Abnormal; Notable for the following:    Color, Urine AMBER (*)    APPearance TURBID (*)    Specific Gravity, Urine 1.038 (*)    Glucose, UA >=500 (*)    Hgb urine dipstick SMALL (*)    Protein, ur 100 (*)    Leukocytes, UA LARGE (*)    Squamous Epithelial / LPF 6-30 (*)    Non Squamous Epithelial 0-5 (*)    All other components within normal limits  CBC - Abnormal; Notable for the following:    Hemoglobin 11.9 (*)    All other components within normal limits  COMPREHENSIVE METABOLIC PANEL - Abnormal; Notable for the following:    Potassium 3.4 (*)    Glucose, Bld 206 (*)    Creatinine, Ser 1.71 (*)    Calcium 8.5 (*)    Albumin 3.0 (*)    GFR calc non Af Amer 30 (*)    GFR calc Af Amer 34 (*)    All other components within normal limits  CK  GC/CHLAMYDIA PROBE AMP ([place]) NOT AT [facility]    EKG  EKG Interpretation None       Radiology No results found.  Procedures Procedures (including critical care  time)  Medications Ordered in ED Medications  HYDROcodone-acetaminophen (NORCO/VICODIN) 5-325 MG per tablet 2 tablet (2 tablets Oral Given 04/29/17 1604)  sodium chloride 0.9 % bolus 1,000 mL (0 mLs Intravenous Stopped 04/29/17 2110)     Initial Impression / Assessment and Plan / ED Course  I have reviewed the triage vital signs and the nursing notes.  Pertinent labs & imaging results that were available during my care of the patient were reviewed by me and considered in my medical decision making (see chart for details).    Patient presents with unchanged, chronic left abdominal pain. Previously seen on 6/15. The patient was seen and evaluated by Dr. Laverta Baltimore, attending physician.  Reviewed medical record. Gross blood is noted in the vaginal vault on pelvic exam. The patient reports dyspareunia. She reports that she has had a new sexual partner for the last 8 weeks. Wet prep negative for yeast, trichomonas, or BV. Left ovarian cyst noted on CT A/P from 6/15 and f/u US was recommended. Ultrasound demonstrating benign-appearing cyst with recommendations for 1 year repeat imaging the ultrasound and abnormal endometrial thickness for an asymptomatic postmenopausal female. Discussed the findings with the patient and will provide an OB/GYN referral. Provided the patient with a short course of pain medication and recommended follow up with primary care for a potential f/u to pain management for pain control. Educated the patient on treating chronic pain in the ED. Vital signs have remained stable in the ED. Gc/chlamydia pending; educated the patient on how a positive result would be communicated. At this time, I feel the patient is stable for outpatient follow-up. NAD.  Final Clinical Impressions(s) / ED Diagnoses   Final diagnoses:  Left flank pain  Cyst of left ovary    New Prescriptions Discharge Medication List as of 04/29/2017  8:38 PM    START taking these medications   Details  Insulin  Glargine (LANTUS SOLOSTAR) 100 UNIT/ML Solostar Pen Inject 50 Units into the skin 2 (two) times daily., Starting Mon 04/29/2017, Fax         Jerrad Mendibles A, PA-C 05/01/17 2345    Margette Fast, MD 05/02/17 1241

## 2017-04-29 NOTE — ED Triage Notes (Addendum)
Pt presents w/ worsening 10/10 left flank pain w/o radiation and urinary urgency.  Pt reports she does not have a left kidney. Pt reports being seen here for the same and pain has progressed "severely". Pt denies n/v, diarrhea. Pt denies constipation.

## 2017-04-29 NOTE — Discharge Instructions (Signed)
Please call the [facility] to schedule a follow up visit regarding your ultrasound results today. Your gonorrhea and chlamydia results are still pending at the lab. If the lab gets a positive result once the test is complete, they will call you at the contact number you provided registration. Please call your primary care provider's office to follow up on today's visit to discuss long-term pain control options. If you develop new or worsening symptoms, you can return to the Emergency Department for re-evaluation. Please continued to take the remaining course of your Keflex antibiotics. Please do not drive or work while taking Norco. It is an opioid medication and increases your risk of addition to this medication.

## 2017-04-30 LAB — GC/CHLAMYDIA PROBE AMP (~~LOC~~) NOT AT ARMC
CHLAMYDIA, DNA PROBE: NEGATIVE
NEISSERIA GONORRHEA: NEGATIVE

## 2017-05-01 ENCOUNTER — Ambulatory Visit (INDEPENDENT_AMBULATORY_CARE_PROVIDER_SITE_OTHER): Payer: Medicare Other | Admitting: Obstetrics and Gynecology

## 2017-05-01 ENCOUNTER — Encounter: Payer: Self-pay | Admitting: Obstetrics and Gynecology

## 2017-05-01 ENCOUNTER — Other Ambulatory Visit (HOSPITAL_COMMUNITY)
Admission: RE | Admit: 2017-05-01 | Discharge: 2017-05-01 | Disposition: A | Discharge status: Home / Self Care | Payer: Medicare Other | Source: Ambulatory Visit | Attending: Obstetrics and Gynecology | Admitting: Obstetrics and Gynecology

## 2017-05-01 VITALS — BP 146/99 | HR 64

## 2017-05-01 DIAGNOSIS — R978 Other abnormal tumor markers: Secondary | ICD-10-CM

## 2017-05-01 DIAGNOSIS — N839 Noninflammatory disorder of ovary, fallopian tube and broad ligament, unspecified: Secondary | ICD-10-CM | POA: Diagnosis not present

## 2017-05-01 DIAGNOSIS — N83202 Unspecified ovarian cyst, left side: Secondary | ICD-10-CM

## 2017-05-01 DIAGNOSIS — Z859 Personal history of malignant neoplasm, unspecified: Secondary | ICD-10-CM | POA: Diagnosis not present

## 2017-05-01 DIAGNOSIS — R1012 Left upper quadrant pain: Secondary | ICD-10-CM

## 2017-05-01 DIAGNOSIS — N838 Other noninflammatory disorders of ovary, fallopian tube and broad ligament: Secondary | ICD-10-CM

## 2017-05-01 NOTE — Progress Notes (Signed)
Obstetrics and Gynecology New Patient Evaluation  Appointment Date: 05/01/2017  OBGYN Clinic: [facility]  Primary Care Provider: Tresa Garter  Referring Provider: Tresa Garter, MD  Chief Complaint:  Abdominal/flank pain  History of Present Illness: Catherine Fowler is a 66 y.o. Caucasian P1 (No LMP recorded. Patient is postmenopausal.), seen for the above chief complaint.   Patient states she had new onset left abdomino-flank pain from February through march of this year, and then starting last week she had it again and she's gone to the ER several times.  She states she has aching constantly and many episodes (10-20/day) of sharp pains in that area. It doesn't move and no aggravating or alleviating s/s (except strong IV meds in the ER) and no fevers, chills.   She had a 6/15 CT that showed a stable 58m left uretero-pelvic junction stone and then an u/s when she went to the ER a few days later that showed a 2.8 x 2.3 x 2.1cm simple left ovarian cyst with overal size of the LO 4.7cm. She re-presented to the ER on 6/18 for similar s/s and had a pelvic u/s that showed a simple LO cyst.   Review of Systems:  as noted in the History of Present Illness.  Past Medical History:  Past Medical History:  Diagnosis Date  . Chronic kidney disease   . Colon cancer ([facility] 09/18/13  . Diabetes mellitus     Past Surgical History:  Past Surgical History:  Procedure Laterality Date  . ABDOMINAL SURGERY    . APPENDECTOMY    . BREAST SURGERY  2/97   breast reduction   . CHOLECYSTECTOMY    . GASTRIC BYPASS  10/16/2004  . HERNIA REPAIR    . RHINOPLASTY    . TUBAL LIGATION    . VENTRAL HERNIA REPAIR  06/17/2012   Procedure: HERNIA REPAIR VENTRAL ADULT;  Surgeon: HAdin Hector MD;  Location: [facility];  Service: General;  Laterality: N/A;    Past Obstetrical History:  OB History  Obstetric Comments  SVD x 1.     Past Gynecological  History: As per HPI. LMP: in her 89sNo h/o abnormal pap smears.  2014 NILM/HPV neg No. HRT use.   Social History:  Social History   Social History  . Marital status: Widowed    Spouse name: N/A  . Number of children: N/A  . Years of education: N/A   Occupational History  . Not on file.   Social History Main Topics  . Smoking status: Never Smoker  . Smokeless tobacco: Never Used  . Alcohol use No  . Drug use: No  . Sexual activity: Not Currently   Other Topics Concern  . Not on file   Social History Narrative  . No narrative on file    Family History:  Family History  Problem Relation Age of Onset  . Diabetes type II Father   . Diabetes type II Other   . Diabetes type II Sister   . Diabetes type II Maternal Aunt     Health Maintenance:  Mammogram Yes.  , which was done in 11/2016 and bi rads 1   Medications Ms. JCorporanhad no medications administered during this visit. Current Outpatient Prescriptions  Medication Sig Dispense Refill  . cephALEXin (KEFLEX) 500 MG capsule Take 1 capsule (500 mg total) by mouth 3 (three) times daily. 21 capsule 0  . fenofibrate (TRICOR) 145 MG tablet Take 1 tablet (145 mg  total) by mouth daily. 90 tablet 3  . glipiZIDE (GLUCOTROL) 10 MG tablet Take 1 tablet (10 mg total) by mouth 2 (two) times daily before a meal. 180 tablet 3  . glucose blood (WAVESENSE PRESTO) test strip Use as instructed 100 each 12  . glucose monitoring kit (FREESTYLE) monitoring kit 1 each by Does not apply route 4 (four) times daily - after meals and at bedtime. 1 month Diabetic Testing Supplies for QAC-QHS accuchecks. Any brand okay 1 each 1  . HYDROcodone-acetaminophen (NORCO/VICODIN) 5-325 MG tablet Take 2 tablets by mouth every 6 (six) hours as needed. 16 tablet 0  . Insulin Glargine (LANTUS SOLOSTAR) 100 UNIT/ML Solostar Pen Inject 50 Units into the skin 2 (two) times daily. 30 mL 3   No current facility-administered medications for this visit.      Allergies Pravastatin sodium and Simvastatin   Physical Exam:  BP (!) 146/99   Pulse 64  There is no height or weight on file to calculate BMI. General appearance: Well nourished, well developed female in no acute distress.  Neck:  Supple, normal appearance, and no thyromegaly  Cardiovascular: normal s1 and s2.  No murmurs, rubs or gallops. Respiratory:  Clear to auscultation bilateral. Normal respiratory effort Abdomen: positive bowel sounds and no masses, hernias; diffusely non tender to palpation, non distended. Pt is slightly ttp in near left flank / lower rib area. No peritoneal s/s.  Neuro/Psych:  Normal mood and affect.  Skin:  Warm and dry.  Back: no CVAT Lymphatic:  No inguinal lymphadenopathy.   Pelvic exam: is limited by body habitus EGBUS: within normal limits with moderate atrophy, Vagina: within normal limits and with no blood or discharge in the vault, +atrophy Cervix: normal appearing cervix without tenderness, discharge or lesions. +cervical stenosis. Hard to pass brush more than a few mm in to the canal. Uterus:  nonenlarged and non tender and Adnexa:  normal adnexa and no mass, fullness, tenderness Rectovaginal: deferred  Laboratory: none  Radiology:  CLINICAL DATA:  Questionable adnexal mass on recent CT. Left-sided pain  EXAM: TRANSABDOMINAL AND TRANSVAGINAL ULTRASOUND OF PELVIS  TECHNIQUE: Study was performed transabdominally to optimize pelvic field of view evaluation and transvaginally to optimize internal visceral architectural evaluation.  COMPARISON:  CT abdomen and pelvis April 26, 2017  FINDINGS: Uterus  Measurements: 7.8 x 4.4 x 5.4 cm. No fibroids or other mass visualized.  Endometrium  Thickness: 8 mm.  Endometrial contour smooth.  Right ovary  Unable to visualize transabdominally or transvaginally. No right-sided pelvic mass evident.  Left ovary  Measurements: 4.7 x 3.1 x 3.2 cm. Cystic area in left ovary  measures 2.8 x 2.3 x 2.1 cm. No other left-sided pelvic mass. No free pelvic fluid.  Other findings  No abnormal free fluid.  IMPRESSION: Benign appearing cystic area arising from left ovary 2.8 x 2.3 x 2.1 cm. This is almost certainly benign, but follow up ultrasound is recommended in 1 year according to the Society of Radiologists in [place] Statement (D Clovis Riley et al. Management of Asymptomatic Ovarian and Other Adnexal Cysts Imaged at [place]: Society of Radiologists in [place] Statement 2010. Radiology 256 (Sept 2010): [phone].).  Endometrium measures 8 mm in thickness. Endometrial thickness is considered abnormal for an asymptomatic post-menopausal female. Endometrial sampling should be considered to exclude carcinoma.  Right ovary could not be seen by either transabdominal or transvaginal technique. No right-sided pelvic mass evident by ultrasound.   Electronically Signed   By: Gwyndolyn Saxon  Jasmine December III M.D.   On: 04/29/2017 19:01  Study Result   CLINICAL DATA:  Flank pain. Pt stated she only has one kidney due to kidney stone that was size of golf ball. "Pt c/o LUQ abdominal pain onset February. Has been seen by multiple providers and given multiple treatments without relief.  EXAM: CT ABDOMEN AND PELVIS WITHOUT CONTRAST  TECHNIQUE: Multidetector CT imaging of the abdomen and pelvis was performed following the standard protocol without IV contrast.  COMPARISON:  CT of the abdomen and pelvis 01/11/2017 and earlier exams  FINDINGS: Lower chest: No acute abnormality.  Hepatobiliary: The liver is diffusely low attenuation consistent with hepatic steatosis. No focal liver lesions are identified. Gallbladder is surgically absent.  Pancreas: Unremarkable. No pancreatic ductal dilatation or surrounding inflammatory changes.  Spleen: Normal in size without focal abnormality. Left upper quadrant splenule  is present.  Adrenals/Urinary Tract: The adrenal glands are normal in appearance.  Right kidney is normal in appearance. No intrarenal or ureteral stones are identified.  There is chronic left hydronephrosis and a left ureteropelvic junction calculus measuring 17 mm. Distal left ureter is unremarkable in appearance.  Urinary bladder is normal in appearance.  Stomach/Bowel: Status post gastric bypass surgery. Gastro jejunal anastomosis is unremarkable in appearance. No evidence for obstruction. Numerous small bowel loops are closely apposed to the anterior abdominal wall, raising the question of adhesions. However there is no evidence for small bowel obstruction or bowel wall thickening. Bowel sutures are identified in the region of the lower descending colon/sigmoid colon junction consistent with a history of colon cancer. The colon is otherwise unremarkable in appearance. The appendix is surgically absent.  Vascular/Lymphatic: There is mild atherosclerotic calcification of the abdominal aorta. No retroperitoneal or mesenteric adenopathy.  Reproductive: The uterus is present. A left adnexal low-attenuation lesion 3.9 x 2.8 cm the, stable in appearance compared prior study. Region of the right ovary is unremarkable.  Other: No free pelvic fluid. Anterior abdominal wall laxity and scarring. Possible adhesions to the anterior abdominal wall.  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 No suspicious lytic or blastic lesions are identified.  IMPRESSION: 1. Developing hepatic steatosis, new since 2015. 2. Stable appearance of left adnexal cyst since March of 2018. However as this is a new finding compared with prior studies in 2014 in a postmenopausal patient, further characterization non emergent pelvic ultrasound is recommended. 3. Long-standing obstruction of the left renal pelvis and  large ureteropelvic junction calculus. 4. Unremarkable appearance of gastric bypass. 5. Suspect anterior abdominal wall adhesions. Anterior abdominal wall laxity. 6. No bowel obstruction. 7. Stable appearance of L5 pars defects and grade 1 anterolisthesis of L5 on S1.   Electronically Signed   By: Nolon Nations M.D.   On: 04/26/2017 15:19    Assessment: pt stable  Plan:  1. Left ovarian cyst D/w her that cyst is benign appear and if CA125 is negative then she can get an u/s q6-89mor none at all, depending on her preference.   2. Left upper quadrant pain D/w her that doesn't seem to be of GYN origin. D/w her to d/w her urologist to see if it could be coming from her retained stone.  Pap updated today  RTC PRN  CDurene RomansMD Attending [facility]

## 2017-05-02 LAB — CA 125: CA 125: 14.1 U/mL (ref 0.0–38.1)

## 2017-05-06 LAB — CYTOLOGY - PAP
DIAGNOSIS: NEGATIVE
HPV: NOT DETECTED

## 2017-05-10 ENCOUNTER — Telehealth: Payer: Self-pay | Admitting: General Practice

## 2017-05-10 ENCOUNTER — Encounter: Payer: Medicare Other | Admitting: Obstetrics & Gynecology

## 2017-05-10 NOTE — Telephone Encounter (Signed)
-----   Message from Aletha Halim, MD sent at 05/09/2017  1:54 PM EDT ----- Can you let them know that her blood work and pap were normal? thanks

## 2017-05-10 NOTE — Telephone Encounter (Signed)
Called patient, no answer- left message stating we are trying to reach you to inform you that your results from your recent visit were normal. If you have any questions you may call us back

## 2017-05-20 MED FILL — FENOFIBRATE 145 MG TAB: 145 | 30 days supply | Qty: 30 | Fill #8

## 2017-05-20 MED FILL — ?GLIPIZIDE 10 MG TABLET: 10 | 30 days supply | Qty: 60 | Fill #8

## 2017-06-17 MED FILL — ?GLIPIZIDE 10 MG TABLET: 10 | 30 days supply | Qty: 60 | Fill #9

## 2017-06-17 MED FILL — ?FENOFIBRATE 145 MG TABS: 145 | 30 days supply | Qty: 30 | Fill #9

## 2017-06-27 ENCOUNTER — Encounter: Payer: Self-pay | Admitting: Internal Medicine

## 2017-06-27 DIAGNOSIS — E119 Type 2 diabetes mellitus without complications: Secondary | ICD-10-CM | POA: Diagnosis not present

## 2017-06-27 LAB — HM DIABETES EYE EXAM

## 2017-06-30 IMAGING — MG 2D DIGITAL SCREENING BILATERAL MAMMOGRAM WITH CAD AND ADJUNCT TO
8 of 9 series · 8 of 9 positions shown · non-contrast
Comparison: None.

CLINICAL DATA: Screening.

EXAM:
2D DIGITAL SCREENING BILATERAL MAMMOGRAM WITH CAD AND ADJUNCT TOMO

[R CC (1 of 2)]
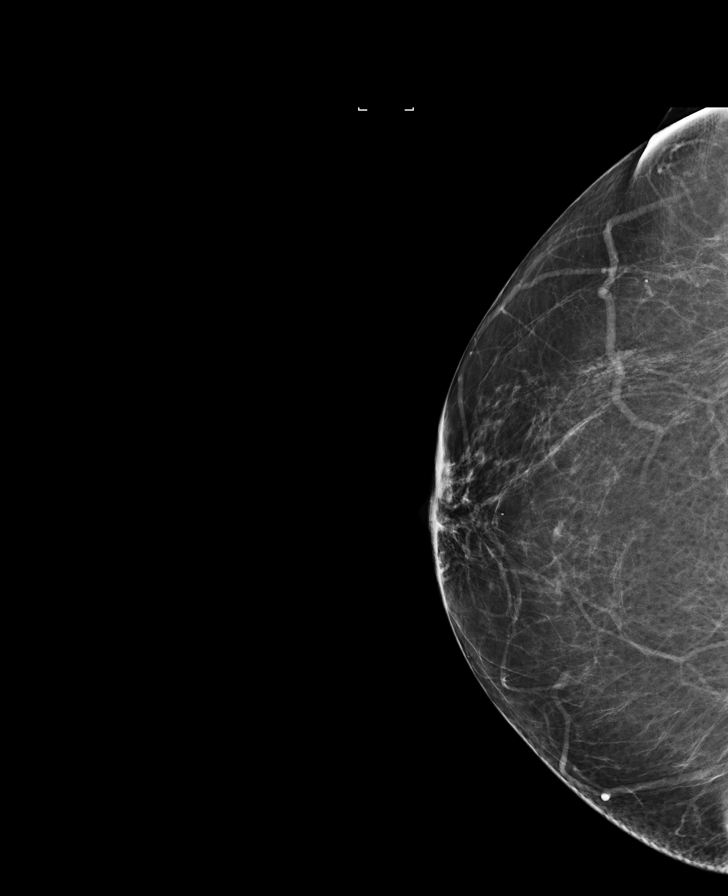

[L CC]
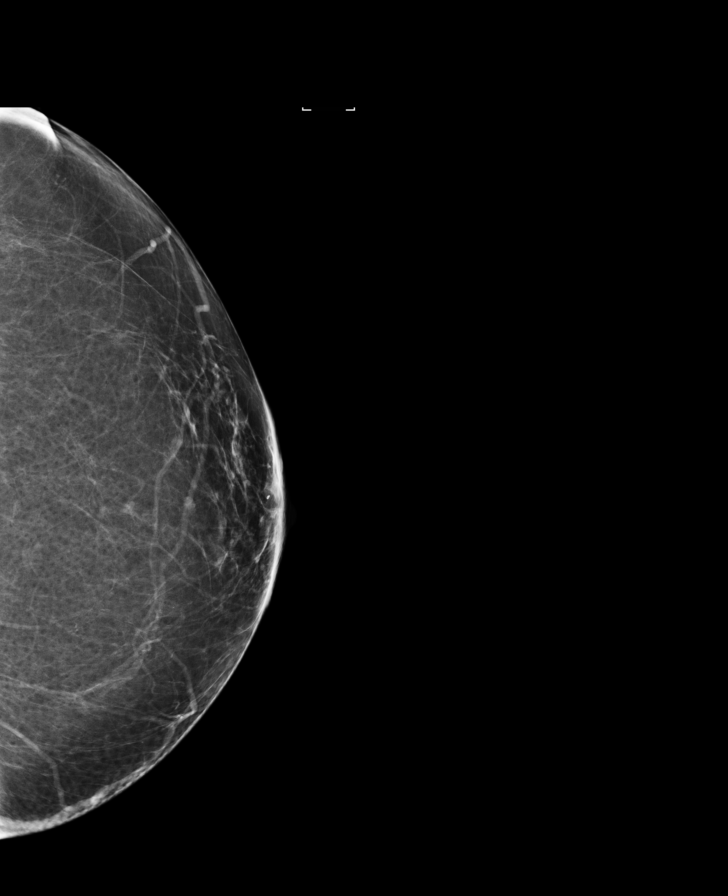

[R CC synth-2D]
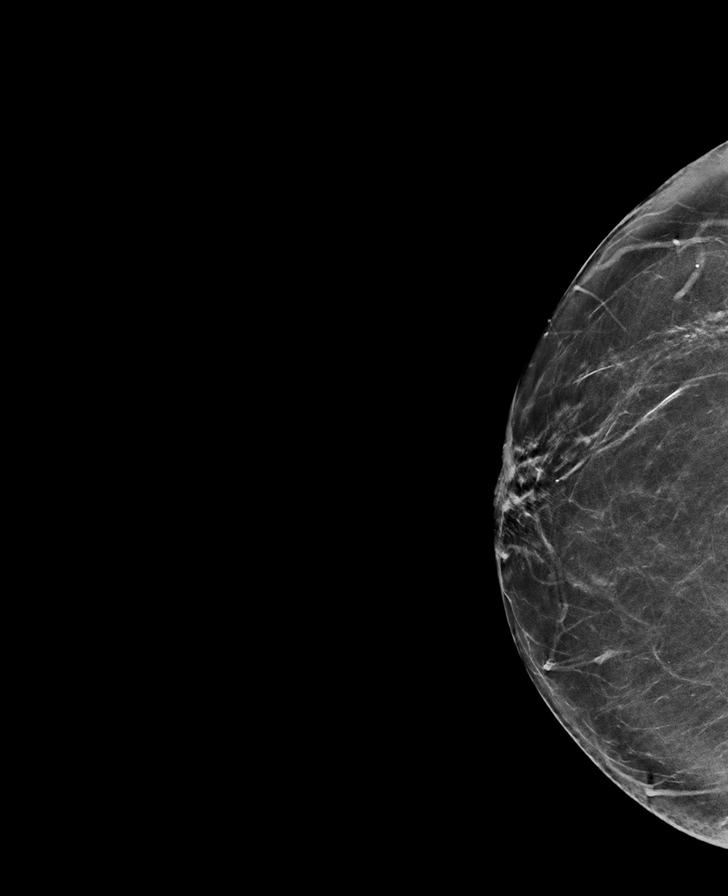

[R CC (2 of 2)]
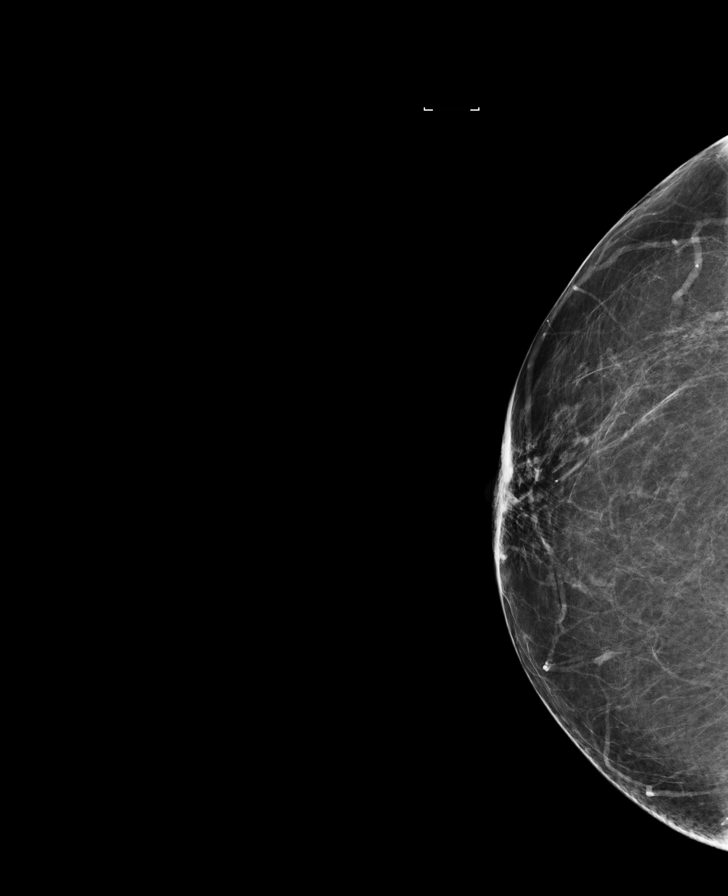

[L MLO]
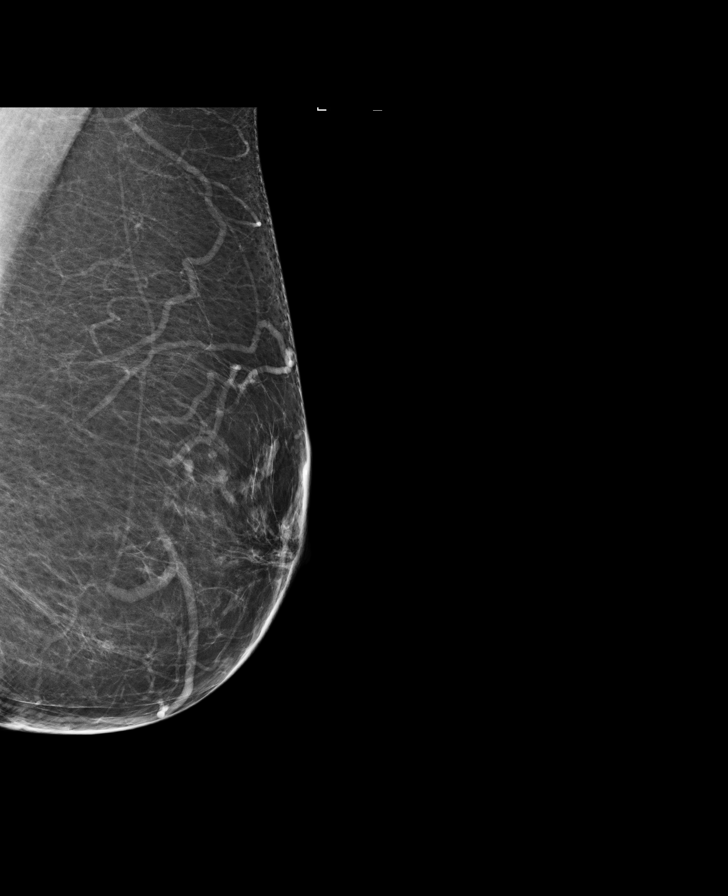

[R MLO]
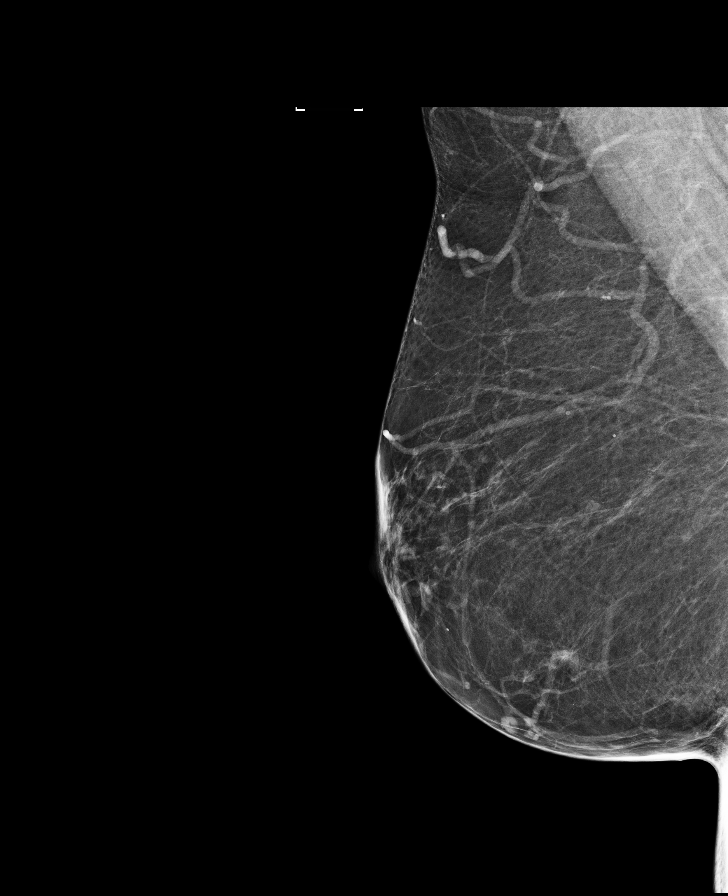

[R MLO synth-2D]
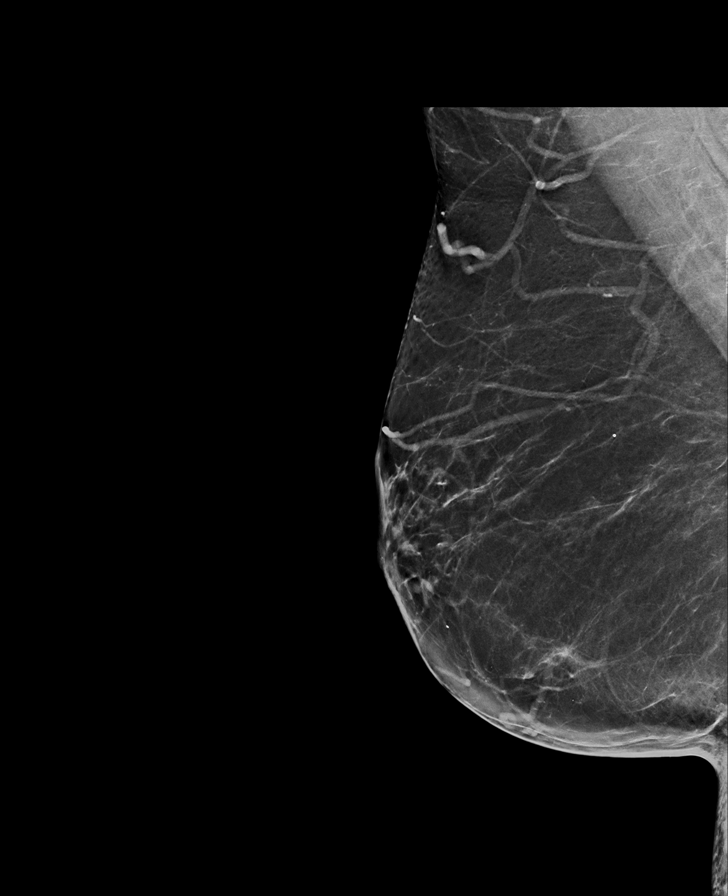

[L CC synth-2D]
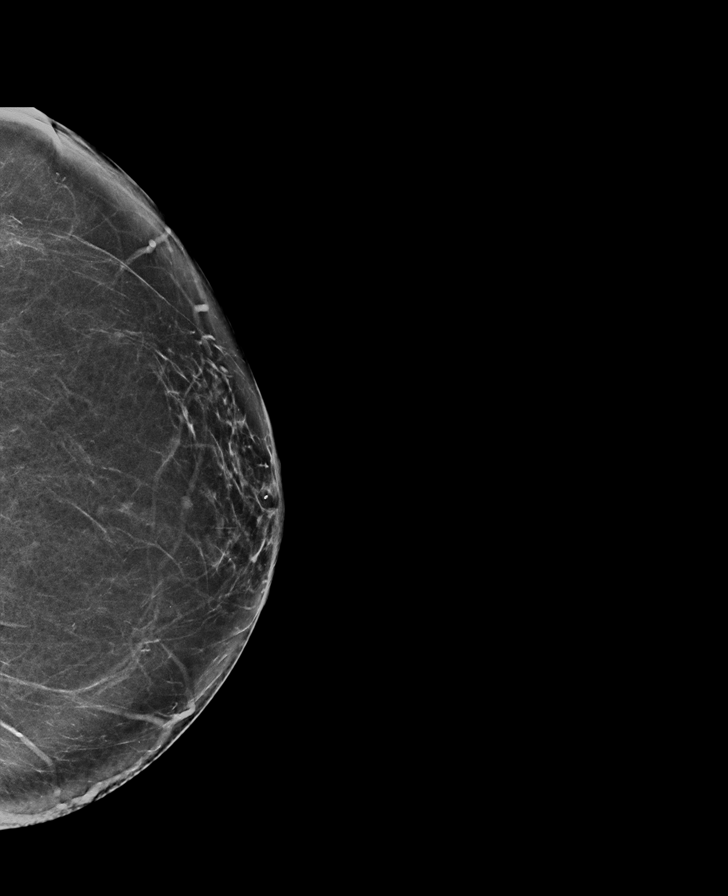

[8 of 9 positions shown; findings below may reference images not displayed]

ACR Breast Density Category b: There are scattered areas of
fibroglandular density.
FINDINGS: There are no findings suspicious for malignancy. Images were
processed with CAD.
IMPRESSION: No mammographic evidence of malignancy. A result letter of this
screening mammogram will be mailed directly to the patient.

RECOMMENDATION:
Screening mammogram in one year. (Code:[id number]-M-3AZ)

BI-RADS CATEGORY  1: Negative.

## 2017-07-06 DIAGNOSIS — N898 Other specified noninflammatory disorders of vagina: Secondary | ICD-10-CM | POA: Diagnosis not present

## 2017-07-06 DIAGNOSIS — N760 Acute vaginitis: Secondary | ICD-10-CM | POA: Diagnosis not present

## 2017-07-10 ENCOUNTER — Encounter: Payer: Self-pay | Admitting: Internal Medicine

## 2017-07-10 ENCOUNTER — Ambulatory Visit: Payer: Medicare Other | Attending: Internal Medicine | Admitting: Internal Medicine

## 2017-07-10 VITALS — BP 160/70 | HR 79 | Temp 98.4°F | Resp 18 | Ht 67.0 in | Wt 235.6 lb

## 2017-07-10 DIAGNOSIS — F329 Major depressive disorder, single episode, unspecified: Secondary | ICD-10-CM | POA: Diagnosis not present

## 2017-07-10 DIAGNOSIS — N189 Chronic kidney disease, unspecified: Secondary | ICD-10-CM | POA: Insufficient documentation

## 2017-07-10 DIAGNOSIS — B373 Candidiasis of vulva and vagina: Secondary | ICD-10-CM | POA: Diagnosis not present

## 2017-07-10 DIAGNOSIS — Z794 Long term (current) use of insulin: Secondary | ICD-10-CM | POA: Insufficient documentation

## 2017-07-10 DIAGNOSIS — E1122 Type 2 diabetes mellitus with diabetic chronic kidney disease: Secondary | ICD-10-CM | POA: Diagnosis not present

## 2017-07-10 DIAGNOSIS — E119 Type 2 diabetes mellitus without complications: Secondary | ICD-10-CM | POA: Diagnosis not present

## 2017-07-10 DIAGNOSIS — E1165 Type 2 diabetes mellitus with hyperglycemia: Secondary | ICD-10-CM | POA: Diagnosis not present

## 2017-07-10 DIAGNOSIS — E785 Hyperlipidemia, unspecified: Secondary | ICD-10-CM

## 2017-07-10 DIAGNOSIS — Z79899 Other long term (current) drug therapy: Secondary | ICD-10-CM | POA: Diagnosis not present

## 2017-07-10 DIAGNOSIS — B3731 Acute candidiasis of vulva and vagina: Secondary | ICD-10-CM | POA: Insufficient documentation

## 2017-07-10 DIAGNOSIS — I129 Hypertensive chronic kidney disease with stage 1 through stage 4 chronic kidney disease, or unspecified chronic kidney disease: Secondary | ICD-10-CM | POA: Insufficient documentation

## 2017-07-10 LAB — GLUCOSE, POCT (MANUAL RESULT ENTRY): POC GLUCOSE: 317 mg/dL — AB (ref 70–99)

## 2017-07-10 LAB — POCT GLYCOSYLATED HEMOGLOBIN (HGB A1C): HEMOGLOBIN A1C: 10.1

## 2017-07-10 MED ORDER — GLIPIZIDE 10 MG PO TABS: 10.0000 mg | ORAL_TABLET | Freq: Two times a day (BID) | ORAL | 3 refills | Status: DC

## 2017-07-10 MED ORDER — FENOFIBRATE 145 MG PO TABS: 145.0000 mg | ORAL_TABLET | Freq: Every day | ORAL | 3 refills | Status: DC

## 2017-07-10 MED ORDER — FLUCONAZOLE 150 MG PO TABS: 150.0000 mg | ORAL_TABLET | ORAL | 1 refills | Status: DC | PRN

## 2017-07-10 MED FILL — glipiZIDE 10 MG TABS: 10 | 30 days supply | Qty: 60 | Fill #0

## 2017-07-10 MED FILL — ?FENOFIBRATE 145 MG TABLET: 145 | 30 days supply | Qty: 30 | Fill #0

## 2017-07-10 MED FILL — FLUCONAZOLE 150 MG TABLET: 150 | 2 days supply | Qty: 2 | Fill #0

## 2017-07-10 NOTE — Patient Instructions (Signed)
Diabetes Mellitus and Food It is important for you to manage your blood sugar (glucose) level. Your blood glucose level can be greatly affected by what you eat. Eating healthier foods in the appropriate amounts throughout the day at about the same time each day will help you control your blood glucose level. It can also help slow or prevent worsening of your diabetes mellitus. Healthy eating may even help you improve the level of your blood pressure and reach or maintain a healthy weight. General recommendations for healthful eating and cooking habits include:  Eating meals and snacks regularly. Avoid going long periods of time without eating to lose weight.  Eating a diet that consists mainly of plant-based foods, such as fruits, vegetables, nuts, legumes, and whole grains.  Using low-heat cooking methods, such as baking, instead of high-heat cooking methods, such as deep frying.  Work with your dietitian to make sure you understand how to use the Nutrition Facts information on food labels. How can food affect me? Carbohydrates Carbohydrates affect your blood glucose level more than any other type of food. Your dietitian will help you determine how many carbohydrates to eat at each meal and teach you how to count carbohydrates. Counting carbohydrates is important to keep your blood glucose at a healthy level, especially if you are using insulin or taking certain medicines for diabetes mellitus. Alcohol Alcohol can cause sudden decreases in blood glucose (hypoglycemia), especially if you use insulin or take certain medicines for diabetes mellitus. Hypoglycemia can be a life-threatening condition. Symptoms of hypoglycemia (sleepiness, dizziness, and disorientation) are similar to symptoms of having too much alcohol. If your health care provider has given you approval to drink alcohol, do so in moderation and use the following guidelines:  Women should not have more than one drink per day, and men  should not have more than two drinks per day. One drink is equal to: ? 12 oz of beer. ? 5 oz of wine. ? 1 oz of hard liquor.  Do not drink on an empty stomach.  Keep yourself hydrated. Have water, diet soda, or unsweetened iced tea.  Regular soda, juice, and other mixers might contain a lot of carbohydrates and should be counted.  What foods are not recommended? As you make food choices, it is important to remember that all foods are not the same. Some foods have fewer nutrients per serving than other foods, even though they might have the same number of calories or carbohydrates. It is difficult to get your body what it needs when you eat foods with fewer nutrients. Examples of foods that you should avoid that are high in calories and carbohydrates but low in nutrients include:  Trans fats (most processed foods list trans fats on the Nutrition Facts label).  Regular soda.  Juice.  Candy.  Sweets, such as cake, pie, doughnuts, and cookies.  Fried foods.  What foods can I eat? Eat nutrient-rich foods, which will nourish your body and keep you healthy. The food you should eat also will depend on several factors, including:  The calories you need.  The medicines you take.  Your weight.  Your blood glucose level.  Your blood pressure level.  Your cholesterol level.  You should eat a variety of foods, including:  Protein. ? Lean cuts of meat. ? Proteins low in saturated fats, such as fish, egg whites, and beans. Avoid processed meats.  Fruits and vegetables. ? Fruits and vegetables that may help control blood glucose levels, such as apples,   mangoes, and yams.  Dairy products. ? Choose fat-free or low-fat dairy products, such as milk, yogurt, and cheese.  Grains, bread, pasta, and rice. ? Choose whole grain products, such as multigrain bread, whole oats, and brown rice. These foods may help control blood pressure.  Fats. ? Foods containing healthful fats, such as  nuts, avocado, olive oil, canola oil, and fish.  Does everyone with diabetes mellitus have the same meal plan? Because every person with diabetes mellitus is different, there is not one meal plan that works for everyone. It is very important that you meet with a dietitian who will help you create a meal plan that is just right for you. This information is not intended to replace advice given to you by your health care provider. Make sure you discuss any questions you have with your health care provider. Document Released: 07/26/2005 Document Revised: 04/05/2016 Document Reviewed: 09/25/2013 Elsevier Interactive Patient Education  2017 [employer] Diabetes Mellitus and Exercise Exercising regularly is important for your overall health, especially when you have diabetes (diabetes mellitus). Exercising is not only about losing weight. It has many health benefits, such as increasing muscle strength and bone density and reducing body fat and stress. This leads to improved fitness, flexibility, and endurance, all of which result in better overall health. Exercise has additional benefits for people with diabetes, including:  Reducing appetite.  Helping to lower and control blood glucose.  Lowering blood pressure.  Helping to control amounts of fatty substances (lipids) in the blood, such as cholesterol and triglycerides.  Helping the body to respond better to insulin (improving insulin sensitivity).  Reducing how much insulin the body needs.  Decreasing the risk for heart disease by: ? Lowering cholesterol and triglyceride levels. ? Increasing the levels of good cholesterol. ? Lowering blood glucose levels.  What is my activity plan? Your health care provider or certified diabetes educator can help you make a plan for the type and frequency of exercise (activity plan) that works for you. Make sure that you:  Do at least 150 minutes of moderate-intensity or vigorous-intensity exercise each  week. This could be brisk walking, biking, or water aerobics. ? Do stretching and strength exercises, such as yoga or weightlifting, at least 2 times a week. ? Spread out your activity over at least 3 days of the week.  Get some form of physical activity every day. ? Do not go more than 2 days in a row without some kind of physical activity. ? Avoid being inactive for more than 90 minutes at a time. Take frequent breaks to walk or stretch.  Choose a type of exercise or activity that you enjoy, and set realistic goals.  Start slowly, and gradually increase the intensity of your exercise over time.  What do I need to know about managing my diabetes?  Check your blood glucose before and after exercising. ? If your blood glucose is higher than 240 mg/dL (13.3 mmol/L) before you exercise, check your urine for ketones. If you have ketones in your urine, do not exercise until your blood glucose returns to normal.  Know the symptoms of low blood glucose (hypoglycemia) and how to treat it. Your risk for hypoglycemia increases during and after exercise. Common symptoms of hypoglycemia can include: ? Hunger. ? Anxiety. ? Sweating and feeling clammy. ? Confusion. ? Dizziness or feeling light-headed. ? Increased heart rate or palpitations. ? Blurry vision. ? Tingling or numbness around the mouth, lips, or tongue. ? Tremors or shakes. ?   Irritability.  Keep a rapid-acting carbohydrate snack available before, during, and after exercise to help prevent or treat hypoglycemia.  Avoid injecting insulin into areas of the body that are going to be exercised. For example, avoid injecting insulin into: ? The arms, when playing tennis. ? The legs, when jogging.  Keep records of your exercise habits. Doing this can help you and your health care provider adjust your diabetes management plan as needed. Write down: ? Food that you eat before and after you exercise. ? Blood glucose levels before and after you  exercise. ? The type and amount of exercise you have done. ? When your insulin is expected to peak, if you use insulin. Avoid exercising at times when your insulin is peaking.  When you start a new exercise or activity, work with your health care provider to make sure the activity is safe for you, and to adjust your insulin, medicines, or food intake as needed.  Drink plenty of water while you exercise to prevent dehydration or heat stroke. Drink enough fluid to keep your urine clear or pale yellow. This information is not intended to replace advice given to you by your health care provider. Make sure you discuss any questions you have with your health care provider. Document Released: 01/19/2004 Document Revised: 05/18/2016 Document Reviewed: 04/09/2016 Elsevier Interactive Patient Education  2018 [employer]. Blood Glucose Monitoring, Adult Monitoring your blood sugar (glucose) helps you manage your diabetes. It also helps you and your health care provider determine how well your diabetes management plan is working. Blood glucose monitoring involves checking your blood glucose as often as directed, and keeping a record (log) of your results over time. Why should I monitor my blood glucose? Checking your blood glucose regularly can:  Help you understand how food, exercise, illnesses, and medicines affect your blood glucose.  Let you know what your blood glucose is at any time. You can quickly tell if you are having low blood glucose (hypoglycemia) or high blood glucose (hyperglycemia).  Help you and your health care provider adjust your medicines as needed.  When should I check my blood glucose? Follow instructions from your health care provider about how often to check your blood glucose. This may depend on:  The type of diabetes you have.  How well-controlled your diabetes is.  Medicines you are taking.  If you have type 1 diabetes:  Check your blood glucose at least 2 times a  day.  Also check your blood glucose: ? Before every insulin injection. ? Before and after exercise. ? Between meals. ? 2 hours after a meal. ? Occasionally between 2:00 a.m. and 3:00 a.m., as directed. ? Before potentially dangerous tasks, like driving or using heavy machinery. ? At bedtime.  You may need to check your blood glucose more often, up to 6-10 times a day: ? If you use an insulin pump. ? If you need multiple daily injections (MDI). ? If your diabetes is not well-controlled. ? If you are ill. ? If you have a history of severe hypoglycemia. ? If you have a history of not knowing when your blood glucose is getting low (hypoglycemia unawareness). If you have type 2 diabetes:  If you take insulin or other diabetes medicines, check your blood glucose at least 2 times a day.  If you are on intensive insulin therapy, check your blood glucose at least 4 times a day. Occasionally, you may also need to check between 2:00 a.m. and 3:00 a.m., as directed.    Also check your blood glucose: ? Before and after exercise. ? Before potentially dangerous tasks, like driving or using heavy machinery.  You may need to check your blood glucose more often if: ? Your medicine is being adjusted. ? Your diabetes is not well-controlled. ? You are ill. What is a blood glucose log?  A blood glucose log is a record of your blood glucose readings. It helps you and your health care provider: ? Look for patterns in your blood glucose over time. ? Adjust your diabetes management plan as needed.  Every time you check your blood glucose, write down your result and notes about things that may be affecting your blood glucose, such as your diet and exercise for the day.  Most glucose meters store a record of glucose readings in the meter. Some meters allow you to download your records to a computer. How do I check my blood glucose? Follow these steps to get accurate readings of your blood  glucose: Supplies needed   Blood glucose meter.  Test strips for your meter. Each meter has its own strips. You must use the strips that come with your meter.  A needle to prick your finger (lancet). Do not use lancets more than once.  A device that holds the lancet (lancing device).  A journal or log book to write down your results. Procedure  Wash your hands with soap and water.  Prick the side of your finger (not the tip) with the lancet. Use a different finger each time.  Gently rub the finger until a small drop of blood appears.  Follow instructions that come with your meter for inserting the test strip, applying blood to the strip, and using your blood glucose meter.  Write down your result and any notes. Alternative testing sites  Some meters allow you to use areas of your body other than your finger (alternative sites) to test your blood.  If you think you may have hypoglycemia, or if you have hypoglycemia unawareness, do not use alternative sites. Use your finger instead.  Alternative sites may not be as accurate as the fingers, because blood flow is slower in these areas. This means that the result you get may be delayed, and it may be different from the result that you would get from your finger.  The most common alternative sites are: ? Forearm. ? Thigh. ? Palm of the hand. Additional tips  Always keep your supplies with you.  If you have questions or need help, all blood glucose meters have a 24-hour "hotline" number that you can call. You may also contact your health care provider.  After you use a few boxes of test strips, adjust (calibrate) your blood glucose meter by following instructions that came with your meter. This information is not intended to replace advice given to you by your health care provider. Make sure you discuss any questions you have with your health care provider. Document Released: 11/01/2003 Document Revised: 05/18/2016 Document  Reviewed: 04/09/2016 Elsevier Interactive Patient Education  2017 [employer] Inc.  

## 2017-07-10 NOTE — Progress Notes (Signed)
Catherine Fowler, is a 66 y.o. female  [id number]  [id number]  DOB - [birth year]  Chief Complaint  Patient presents with  . Follow-up      Subjective:   Catherine Fowler is a 66 y.o. female with history of hypertension, dyslipidemia, major depression and type 2 diabetes mellitus uncontrolled, currently on Lantus insulin and glipizide 10 mg tablet by mouth twice a day. She presents heretodayfor a follow up visitof DM and HTN and medication refill. Patient claims she is doing well, she has no new complaint today. She is doing much better with her blood sugar control. She is expecting her HbA1C to improve a little today. Last HbA1C was 10.4%. She is also trying with exercise regimen. She reports no hypoglycemic episode. No blurry vision. Denies any suicidal thoughts or ideation, says depression is better. Patient has No headache, No chest pain, No abdominal pain - No Nausea, No new weakness tingling or numbness, No Cough - SOB.  Problem  Vaginal Candidiasis    ALLERGIES: Allergies  Allergen Reactions  . Pravastatin Sodium Itching and Rash  . Simvastatin Rash    PAST MEDICAL HISTORY: Past Medical History:  Diagnosis Date  . Chronic kidney disease   . Colon cancer ([place]) 09/18/13  . Diabetes mellitus     MEDICATIONS AT HOME: Prior to Admission medications   Medication Sig Start Date End Date Taking? Authorizing Provider  fenofibrate (TRICOR) 145 MG tablet Take 1 tablet (145 mg total) by mouth daily. 07/10/17  Yes Tresa Garter, MD  glipiZIDE (GLUCOTROL) 10 MG tablet Take 1 tablet (10 mg total) by mouth 2 (two) times daily before a meal. 07/10/17  Yes Elsworth Ledin E, MD  glucose blood (WAVESENSE PRESTO) test strip Use as instructed 10/02/16  Yes Coye Dawood E, MD  glucose monitoring kit (FREESTYLE) monitoring kit 1 each by Does not apply route 4 (four) times daily - after meals and at bedtime. 1 month Diabetic Testing Supplies for QAC-QHS accuchecks. Any brand  okay 08/14/13  Yes Thurnell Lose, MD  HYDROcodone-acetaminophen (NORCO/VICODIN) 5-325 MG tablet Take 2 tablets by mouth every 6 (six) hours as needed. 04/29/17  Yes McDonald, Mia A, PA-C  Insulin Glargine (LANTUS SOLOSTAR) 100 UNIT/ML Solostar Pen Inject 50 Units into the skin 2 (two) times daily. 04/29/17  Yes Tresa Garter, MD  fluconazole (DIFLUCAN) 150 MG tablet Take 1 tablet (150 mg total) by mouth as needed. 07/10/17   Tresa Garter, MD    Objective:   Vitals:   07/10/17 1517  BP: (!) 160/70  Pulse: 79  Resp: 18  Temp: 98.4 F (36.9 C)  TempSrc: Oral  SpO2: 95%  Weight: 235 lb 9.6 oz (106.9 kg)  Height: '5\' 7"'  (1.702 m)   Exam General appearance : Awake, alert, not in any distress. Speech Clear. Not toxic looking HEENT: Atraumatic and Normocephalic, pupils equally reactive to light and accomodation Neck: Supple, no JVD. No cervical lymphadenopathy.  Chest: Good air entry bilaterally, no added sounds  CVS: S1 S2 regular, no murmurs.  Abdomen: Bowel sounds present, Non tender and not distended with no gaurding, rigidity or rebound. Extremities: B/L Lower Ext shows no edema, both legs are warm to touch Neurology: Awake alert, and oriented X 3, CN II-XII intact, Non focal Skin: No Rash  Data Review Lab Results  Component Value Date   HGBA1C 10.1 07/10/2017   HGBA1C 10.4 04/17/2017   HGBA1C 12.4 01/02/2017    Assessment & Plan   1. Type 2 diabetes  mellitus without complication, without long-term current use of insulin (HCC)  - POCT A1C - Glucose (CBG) Refill - glipiZIDE (GLUCOTROL) 10 MG tablet; Take 1 tablet (10 mg total) by mouth 2 (two) times daily before a meal.  Dispense: 180 tablet; Refill: 3  Aim for 30 minutes of exercise most days. Rethink what you drink. Water is great! Aim for 2-3 Carb Choices per meal (30-45 grams) +/- 1 either way  Aim for 0-15 Carbs per snack if hungry  Include protein in moderation with your meals and snacks  Consider  reading food labels for Total Carbohydrate and Fat Grams of foods  Consider checking BG at alternate times per day  Continue taking medication as directed Be mindful about how much sugar you are adding to beverages and other foods. Fruit Punch - find one with no sugar  Measure and decrease portions of carbohydrate foods  Make your plate and don't go back for seconds  2. Dyslipidemia Refill - fenofibrate (TRICOR) 145 MG tablet; Take 1 tablet (145 mg total) by mouth daily.  Dispense: 90 tablet; Refill: 3  To address this please limit saturated fat to no more than 7% of your calories, limit cholesterol to 200 mg/day, increase fiber and exercise as tolerated. If needed we may add another cholesterol lowering medication to your regimen.   3. Vaginal candidiasis  - fluconazole (DIFLUCAN) 150 MG tablet; Take 1 tablet (150 mg total) by mouth as needed.  Dispense: 2 tablet; Refill: 1  Patient have been counseled extensively about nutrition and exercise. Other issues discussed during this visit include: low cholesterol diet, weight control and daily exercise, foot care, annual eye examinations at Ophthalmology, importance of adherence with medications and regular follow-up. We also discussed long term complications of uncontrolled diabetes.   Return in about 3 months (around 10/10/2017) for Hemoglobin A1C and Follow up, DM.  The patient was given clear instructions to go to ER or return to medical center if symptoms don't improve, worsen or new problems develop. The patient verbalized understanding. The patient was told to call to get lab results if they haven't heard anything in the next week.   This note has been created with Surveyor, quantity. Any transcriptional errors are unintentional.    Angelica Chessman, MD, [place], [place], [place], [place] and [place], [place]   07/10/2017, 3:45 PM

## 2017-07-24 ENCOUNTER — Ambulatory Visit: Payer: Medicare Other | Admitting: Internal Medicine

## 2017-07-30 DIAGNOSIS — Z23 Encounter for immunization: Secondary | ICD-10-CM | POA: Diagnosis not present

## 2017-08-16 MED FILL — ?FENOFIBRATE 145 MG TABLET: 145 | 30 days supply | Qty: 30 | Fill #1

## 2017-08-16 MED FILL — glipiZIDE 10 MG TABS: 10 | 30 days supply | Qty: 60 | Fill #1

## 2017-09-06 ENCOUNTER — Other Ambulatory Visit: Payer: Self-pay | Admitting: Pharmacist

## 2017-09-06 DIAGNOSIS — E119 Type 2 diabetes mellitus without complications: Secondary | ICD-10-CM

## 2017-09-06 MED ORDER — INSULIN GLARGINE 100 UNIT/ML SOLOSTAR PEN: 50.0000 [IU] | PEN_INJECTOR | Freq: Two times a day (BID) | SUBCUTANEOUS | 3 refills | Status: DC

## 2017-09-09 MED FILL — glipiZIDE 10 MG TABS: 10 | 30 days supply | Qty: 60 | Fill #2

## 2017-09-09 MED FILL — ?FENOFIBRATE 145 MG TABS: 145 | 30 days supply | Qty: 30 | Fill #2

## 2017-10-07 MED FILL — ?GLIPIZIDE 10 MG TABLET: 10 | 30 days supply | Qty: 60 | Fill #3

## 2017-10-07 MED FILL — ?FENOFIBRATE 145 MG TABS: 145 | 30 days supply | Qty: 30 | Fill #3

## 2017-10-09 ENCOUNTER — Ambulatory Visit: Payer: Medicare Other | Attending: Internal Medicine | Admitting: Internal Medicine

## 2017-10-09 VITALS — BP 146/78 | HR 72 | Temp 98.3°F | Resp 18 | Ht 67.0 in | Wt 233.0 lb

## 2017-10-09 DIAGNOSIS — B3731 Acute candidiasis of vulva and vagina: Secondary | ICD-10-CM

## 2017-10-09 DIAGNOSIS — I10 Essential (primary) hypertension: Secondary | ICD-10-CM

## 2017-10-09 DIAGNOSIS — E119 Type 2 diabetes mellitus without complications: Secondary | ICD-10-CM | POA: Insufficient documentation

## 2017-10-09 DIAGNOSIS — E785 Hyperlipidemia, unspecified: Secondary | ICD-10-CM | POA: Diagnosis not present

## 2017-10-09 DIAGNOSIS — B373 Candidiasis of vulva and vagina: Secondary | ICD-10-CM | POA: Diagnosis not present

## 2017-10-09 LAB — GLUCOSE, POCT (MANUAL RESULT ENTRY): POC GLUCOSE: 148 mg/dL — AB (ref 70–99)

## 2017-10-09 LAB — POCT GLYCOSYLATED HEMOGLOBIN (HGB A1C): HEMOGLOBIN A1C: 10.1

## 2017-10-09 MED ORDER — FLUCONAZOLE 150 MG PO TABS: 150.0000 mg | ORAL_TABLET | ORAL | 1 refills | Status: DC | PRN

## 2017-10-09 MED FILL — FLUCONAZOLE 150 MG TABLET: 150 | 2 days supply | Qty: 2 | Fill #0

## 2017-10-09 NOTE — Progress Notes (Signed)
Patient will schedule appointment for lab visit only. Orders have been saved for lab visit.

## 2017-10-09 NOTE — Progress Notes (Signed)
Catherine Fowler, is a 66 y.o. female  RAX:[id number]  UPJ:[id number]  DOB - 1951/01/13  Subjective:   Catherine Fowler is a 66 y.o. female with history of hypertension, dyslipidemia, major depression and type 2 diabetes mellitus uncontrolled, currently on Lantus insulin and glipizide 10 mg tablet by mouth twice a day. She presents heretodayfor a follow up visitof DM and HTN and medication refill. Patient claims she is doing well, she has no new complaint today. She is doing much better with her blood sugar control. She is also working on reducing her weight. She is pleased with her progress so far and her HbA1C. She reports no hypoglycemic episode.No blurry vision. Denies any suicidal thoughts or ideation, says depression is better since the healing of her wound. She recently got a gift of $14,000.00 from an old client who passed away (part of client's will). Patient is very excited because she was able to pay off lots of her bills and anticipating a great getaway to treat herself. Patient has No headache, No chest pain, No abdominal pain - No Nausea, No new weakness tingling or numbness, No Cough - SOB.  ALLERGIES: Allergies  Allergen Reactions  . Pravastatin Sodium Itching and Rash  . Simvastatin Rash    PAST MEDICAL HISTORY: Past Medical History:  Diagnosis Date  . Chronic kidney disease   . Colon cancer ([place]) 09/18/13  . Diabetes mellitus    MEDICATIONS AT HOME: Prior to Admission medications   Medication Sig Start Date End Date Taking? Authorizing Provider  fenofibrate (TRICOR) 145 MG tablet Take 1 tablet (145 mg total) by mouth daily. 07/10/17   Tresa Garter, MD  fluconazole (DIFLUCAN) 150 MG tablet Take 1 tablet (150 mg total) by mouth as needed. 10/09/17   Tresa Garter, MD  glipiZIDE (GLUCOTROL) 10 MG tablet Take 1 tablet (10 mg total) by mouth 2 (two) times daily before a meal. 07/10/17   Angelica Chessman E, MD  glucose blood (WAVESENSE PRESTO) test strip Use as  instructed 10/02/16   Tresa Garter, MD  glucose monitoring kit (FREESTYLE) monitoring kit 1 each by Does not apply route 4 (four) times daily - after meals and at bedtime. 1 month Diabetic Testing Supplies for QAC-QHS accuchecks. Any brand okay 08/14/13   Thurnell Lose, MD  HYDROcodone-acetaminophen (NORCO/VICODIN) 5-325 MG tablet Take 2 tablets by mouth every 6 (six) hours as needed. 04/29/17   McDonald, Mia A, PA-C  Insulin Glargine (LANTUS SOLOSTAR) 100 UNIT/ML Solostar Pen Inject 50 Units into the skin 2 (two) times daily. 09/06/17   Tresa Garter, MD   Objective:   Vitals:   10/09/17 0953  BP: (!) 146/78  Pulse: 72  Resp: 18  Temp: 98.3 F (36.8 C)  TempSrc: Oral  SpO2: 97%  Weight: 233 lb (105.7 kg)  Height: _0  (1.702 m)   Exam General appearance : Awake, alert, not in any distress. Speech Clear. Not toxic looking, obese HEENT: Atraumatic and Normocephalic, pupils equally reactive to light and accomodation Neck: Supple, no JVD. No cervical lymphadenopathy.  Chest: Good air entry bilaterally, no added sounds  CVS: S1 S2 regular, no murmurs.  Abdomen: Bowel sounds present, Non tender and not distended with no gaurding, rigidity or rebound. Extremities: B/L Lower Ext shows no edema, both legs are warm to touch Neurology: Awake alert, and oriented X 3, CN II-XII intact, Non focal Skin: No Rash  Data Review Lab Results  Component Value Date   HGBA1C 10.1 10/09/2017  HGBA1C 10.1 07/10/2017   HGBA1C 10.4 04/17/2017    Assessment & Plan   1. Type 2 diabetes mellitus without complication, without long-term current use of insulin (HCC)  - HgB A1c - Glucose (CBG)  Aim for 30 minutes of exercise most days. Rethink what you drink. Water is great! Aim for 2-3 Carb Choices per meal (30-45 grams) +/- 1 either way  Aim for 0-15 Carbs per snack if hungry  Include protein in moderation with your meals and snacks  Consider reading food labels for Total  Carbohydrate and Fat Grams of foods  Consider checking BG at alternate times per day  Continue taking medication as directed Be mindful about how much sugar you are adding to beverages and other foods. Fruit Punch - find one with no sugar  Measure and decrease portions of carbohydrate foods  Make your plate and don't go back for seconds  2. Dyslipidemia  - Lipid panel  To address this please limit saturated fat to no more than 7% of your calories, limit cholesterol to 200 mg/day, increase fiber and exercise as tolerated. If needed we may add another cholesterol lowering medication to your regimen.   3. Essential hypertension  - CMP14+EGFR  We have discussed target BP range and blood pressure goal. I have advised patient to check BP regularly and to call us back or report to clinic if the numbers are consistently higher than 140/90. We discussed the importance of compliance with medical therapy and DASH diet recommended, consequences of uncontrolled hypertension discussed.  - continue current BP medications  4. Vaginal candidiasis  - fluconazole (DIFLUCAN) 150 MG tablet; Take 1 tablet (150 mg total) by mouth as needed.  Dispense: 2 tablet; Refill: 1  Patient have been counseled extensively about nutrition and exercise. Other issues discussed during this visit include: low cholesterol diet, weight control and daily exercise, foot care, annual eye examinations at Ophthalmology, importance of adherence with medications and regular follow-up. We also discussed long term complications of uncontrolled diabetes and hypertension.   Return in about 3 months (around 01/09/2018) for Hemoglobin A1C and Follow up, DM, Follow up HTN.  The patient was given clear instructions to go to ER or return to medical center if symptoms don't improve, worsen or new problems develop. The patient verbalized understanding. The patient was told to call to get lab results if they haven't heard anything in the next  week.   This note has been created with Surveyor, quantity. Any transcriptional errors are unintentional.    Angelica Chessman, MD, [place], Karilyn Cota, [place] and [facility], [place]   10/09/2017, 10:33 AM

## 2017-10-09 NOTE — Progress Notes (Signed)
[place]

## 2017-11-21 MED FILL — glipiZIDE 10 MG TABS: 10 | 30 days supply | Qty: 60 | Fill #4

## 2017-11-21 MED FILL — FENOFIBRATE 145 MG TABLET: 145 | 30 days supply | Qty: 30 | Fill #4

## 2017-11-22 ENCOUNTER — Ambulatory Visit: Payer: Medicare Other | Attending: Internal Medicine

## 2017-11-22 DIAGNOSIS — E785 Hyperlipidemia, unspecified: Secondary | ICD-10-CM | POA: Diagnosis not present

## 2017-11-22 DIAGNOSIS — E119 Type 2 diabetes mellitus without complications: Secondary | ICD-10-CM | POA: Diagnosis not present

## 2017-11-22 DIAGNOSIS — I10 Essential (primary) hypertension: Secondary | ICD-10-CM | POA: Diagnosis not present

## 2017-11-22 NOTE — Progress Notes (Signed)
Patient here for lab visit only 

## 2017-11-26 LAB — CMP14+EGFR
ALBUMIN: 3.9 g/dL (ref 3.6–4.8)
ALK PHOS: 105 IU/L (ref 39–117)
ALT: 19 IU/L (ref 0–32)
AST: 22 IU/L (ref 0–40)
Albumin/Globulin Ratio: 1.3 (ref 1.2–2.2)
BUN / CREAT RATIO: 11 — AB (ref 12–28)
BUN: 15 mg/dL (ref 8–27)
CHLORIDE: 105 mmol/L (ref 96–106)
CO2: 28 mmol/L (ref 20–29)
Calcium: 9.5 mg/dL (ref 8.7–10.3)
Creatinine, Ser: 1.31 mg/dL — ABNORMAL HIGH (ref 0.57–1.00)
GFR calc Af Amer: 48 mL/min/{1.73_m2} — ABNORMAL LOW (ref >59)
GFR calc non Af Amer: 42 mL/min/{1.73_m2} — ABNORMAL LOW (ref >59)
GLUCOSE: 211 mg/dL — AB (ref 65–99)
Globulin, Total: 2.9 g/dL (ref 1.5–4.5)
Potassium: 4.4 mmol/L (ref 3.5–5.2)
Sodium: 143 mmol/L (ref 134–144)
Total Protein: 6.8 g/dL (ref 6.0–8.5)

## 2017-11-26 LAB — LIPID PANEL
CHOLESTEROL TOTAL: 170 mg/dL (ref 100–199)
Chol/HDL Ratio: 5 ratio — ABNORMAL HIGH (ref 0.0–4.4)
HDL: 34 mg/dL — ABNORMAL LOW (ref >39)
LDL Calculated: 88 mg/dL (ref 0–99)
Triglycerides: 239 mg/dL — ABNORMAL HIGH (ref 0–149)
VLDL CHOLESTEROL CAL: 48 mg/dL — AB (ref 5–40)

## 2017-12-06 ENCOUNTER — Telehealth: Payer: Self-pay | Admitting: *Deleted

## 2017-12-06 NOTE — Telephone Encounter (Signed)
-----   Message from Tresa Garter, MD sent at 12/01/2017 12:00 PM EST ----- Please inform the patient that her kidney function is stable and cholesterol level is improving. Please continue medications and regular physical exercise

## 2017-12-06 NOTE — Telephone Encounter (Signed)
Patient verified DOB Patient is aware of kidney being stable and cholesterol level improving. Patient advised to continue medications and regular exercise. Patient also will be traveling to [place] in April and will call the beginning of April to obtain a letter verifying her need to take her DM supplies on the plane. No further questions at this time.

## 2017-12-16 ENCOUNTER — Other Ambulatory Visit: Payer: Self-pay | Admitting: Internal Medicine

## 2017-12-16 ENCOUNTER — Other Ambulatory Visit: Payer: Self-pay | Admitting: Pharmacist

## 2017-12-16 DIAGNOSIS — E119 Type 2 diabetes mellitus without complications: Secondary | ICD-10-CM

## 2017-12-16 MED ORDER — INSULIN GLARGINE 100 UNIT/ML SOLOSTAR PEN: 50.0000 [IU] | PEN_INJECTOR | Freq: Two times a day (BID) | SUBCUTANEOUS | 3 refills | Status: DC

## 2017-12-16 NOTE — Telephone Encounter (Signed)
The fax transmission failed. Resent fax and also called pharmacy and left verbal order in case the fax did not go through.

## 2017-12-16 NOTE — Telephone Encounter (Signed)
Thanks Stacey.

## 2017-12-16 NOTE — Telephone Encounter (Signed)
Resent to [facility].

## 2017-12-16 NOTE — Telephone Encounter (Signed)
Epic will not fax the [facility] Department. It keeps sending [facility] to [facility] no matter what I do. Printed script and will hand fax to health department now.

## 2017-12-16 NOTE — Telephone Encounter (Signed)
Pt called to find out why is was [facility], she does not want the prescription to go there, please sent it to [facility] Medical assistance prescription, please call them [phone] Insulin Glargine (LANTUS SOLOSTAR) 100 UNIT/ML Solostar Pen

## 2017-12-18 MED FILL — FENOFIBRATE 145 MG TABLET: 145 | 30 days supply | Qty: 30 | Fill #5

## 2017-12-18 MED FILL — glipiZIDE 10 MG TABS: 10 | 30 days supply | Qty: 60 | Fill #5

## 2018-01-13 MED FILL — glipiZIDE 10 MG TABS: 10 | 30 days supply | Qty: 60 | Fill #6

## 2018-01-13 MED FILL — FENOFIBRATE 145 MG TABLET: 145 | 30 days supply | Qty: 30 | Fill #6

## 2018-02-10 ENCOUNTER — Encounter: Payer: Self-pay | Admitting: *Deleted

## 2018-02-10 ENCOUNTER — Telehealth: Payer: Self-pay | Admitting: *Deleted

## 2018-02-10 NOTE — Telephone Encounter (Signed)
Patient is aware of letter being constructed and placed at the front desk for pick up.

## 2018-02-11 MED FILL — FENOFIBRATE 145 MG TABLET: 145 | 30 days supply | Qty: 30 | Fill #7

## 2018-02-11 MED FILL — glipiZIDE 10 MG TABS: 10 | 30 days supply | Qty: 60 | Fill #7

## 2018-03-17 MED FILL — glipiZIDE 10 MG TABS: 10 | 30 days supply | Qty: 60 | Fill #8

## 2018-03-17 MED FILL — FENOFIBRATE 145 MG TABLET: 145 | 30 days supply | Qty: 30 | Fill #8

## 2018-04-14 MED FILL — FENOFIBRATE 145 MG TABLET: 145 | 30 days supply | Qty: 30 | Fill #9

## 2018-04-14 MED FILL — glipiZIDE 10 MG TABS: 10 | 30 days supply | Qty: 60 | Fill #9

## 2018-04-24 ENCOUNTER — Ambulatory Visit: Payer: Medicare Other

## 2018-05-07 ENCOUNTER — Encounter: Payer: Self-pay | Admitting: Nurse Practitioner

## 2018-05-07 ENCOUNTER — Ambulatory Visit: Payer: Medicare Other | Attending: Nurse Practitioner | Admitting: Nurse Practitioner

## 2018-05-07 VITALS — BP 142/74 | HR 82 | Temp 98.8°F | Ht 67.0 in | Wt 238.9 lb

## 2018-05-07 DIAGNOSIS — Z9884 Bariatric surgery status: Secondary | ICD-10-CM | POA: Insufficient documentation

## 2018-05-07 DIAGNOSIS — E785 Hyperlipidemia, unspecified: Secondary | ICD-10-CM | POA: Diagnosis not present

## 2018-05-07 DIAGNOSIS — Z833 Family history of diabetes mellitus: Secondary | ICD-10-CM | POA: Diagnosis not present

## 2018-05-07 DIAGNOSIS — Z23 Encounter for immunization: Secondary | ICD-10-CM

## 2018-05-07 DIAGNOSIS — Z888 Allergy status to other drugs, medicaments and biological substances status: Secondary | ICD-10-CM | POA: Diagnosis not present

## 2018-05-07 DIAGNOSIS — I129 Hypertensive chronic kidney disease with stage 1 through stage 4 chronic kidney disease, or unspecified chronic kidney disease: Secondary | ICD-10-CM | POA: Insufficient documentation

## 2018-05-07 DIAGNOSIS — Z85038 Personal history of other malignant neoplasm of large intestine: Secondary | ICD-10-CM | POA: Diagnosis not present

## 2018-05-07 DIAGNOSIS — Z794 Long term (current) use of insulin: Secondary | ICD-10-CM | POA: Diagnosis not present

## 2018-05-07 DIAGNOSIS — E119 Type 2 diabetes mellitus without complications: Secondary | ICD-10-CM

## 2018-05-07 DIAGNOSIS — G4709 Other insomnia: Secondary | ICD-10-CM

## 2018-05-07 DIAGNOSIS — Z905 Acquired absence of kidney: Secondary | ICD-10-CM | POA: Insufficient documentation

## 2018-05-07 DIAGNOSIS — E1122 Type 2 diabetes mellitus with diabetic chronic kidney disease: Secondary | ICD-10-CM | POA: Insufficient documentation

## 2018-05-07 DIAGNOSIS — Z87442 Personal history of urinary calculi: Secondary | ICD-10-CM | POA: Insufficient documentation

## 2018-05-07 DIAGNOSIS — I10 Essential (primary) hypertension: Secondary | ICD-10-CM | POA: Diagnosis not present

## 2018-05-07 DIAGNOSIS — M79605 Pain in left leg: Secondary | ICD-10-CM | POA: Insufficient documentation

## 2018-05-07 DIAGNOSIS — Z79899 Other long term (current) drug therapy: Secondary | ICD-10-CM | POA: Insufficient documentation

## 2018-05-07 DIAGNOSIS — N189 Chronic kidney disease, unspecified: Secondary | ICD-10-CM | POA: Diagnosis not present

## 2018-05-07 DIAGNOSIS — M79604 Pain in right leg: Secondary | ICD-10-CM | POA: Diagnosis not present

## 2018-05-07 LAB — GLUCOSE, POCT (MANUAL RESULT ENTRY): POC GLUCOSE: 167 mg/dL — AB (ref 70–99)

## 2018-05-07 LAB — POCT GLYCOSYLATED HEMOGLOBIN (HGB A1C): HEMOGLOBIN A1C: 9.5 % — AB (ref 4.0–5.6)

## 2018-05-07 MED ORDER — GLIPIZIDE 10 MG PO TABS: 10.0000 mg | ORAL_TABLET | Freq: Two times a day (BID) | ORAL | 3 refills | Status: DC

## 2018-05-07 MED ORDER — TRAZODONE HCL 100 MG PO TABS: 100.0000 mg | ORAL_TABLET | Freq: Every day | ORAL | 1 refills | Status: DC

## 2018-05-07 MED ORDER — LISINOPRIL 10 MG PO TABS: 10.0000 mg | ORAL_TABLET | Freq: Every day | ORAL | 1 refills | Status: DC

## 2018-05-07 MED ORDER — INSULIN GLARGINE 100 UNIT/ML SOLOSTAR PEN: 50.0000 [IU] | PEN_INJECTOR | Freq: Two times a day (BID) | SUBCUTANEOUS | 6 refills | Status: DC

## 2018-05-07 MED ORDER — FENOFIBRATE 145 MG PO TABS: 145.0000 mg | ORAL_TABLET | Freq: Every day | ORAL | 3 refills | Status: DC

## 2018-05-07 MED FILL — traZODone HCL 100 MG TABS: 100 | 30 days supply | Qty: 30 | Fill #0

## 2018-05-07 MED FILL — glipiZIDE 10 MG TABS: 10 | 30 days supply | Qty: 60 | Fill #0

## 2018-05-07 MED FILL — LISINOPRIL 10 MG TABS: 10 | 30 days supply | Qty: 30 | Fill #0

## 2018-05-07 NOTE — Progress Notes (Signed)
Assessment & Plan:  Catherine Fowler was seen today for establish care and medication refill.  Diagnoses and all orders for this visit:  Type 2 diabetes mellitus without complication, without long-term current use of insulin (HCC) -     Glucose (CBG) -     HgB A1c -     glipiZIDE (GLUCOTROL) 10 MG tablet; Take 1 tablet (10 mg total) by mouth 2 (two) times daily before a meal. -     Insulin Glargine (LANTUS SOLOSTAR) 100 UNIT/ML Solostar Pen; Inject 50 Units into the skin 2 (two) times daily. -     Microalbumin / creatinine urine ratio Continue blood sugar control as discussed in office today, low carbohydrate diet, and regular physical exercise as tolerated, 150 minutes per week (30 min each day, 5 days per week, or 50 min 3 days per week). Keep blood sugar logs with fasting goal of 80-130 mg/dl, post prandial less than 180.  For Hypoglycemia: BS <60 and Hyperglycemia BS >400; contact the clinic ASAP. Annual eye exams and foot exams are recommended.  Dyslipidemia -     fenofibrate (TRICOR) 145 MG tablet; Take 1 tablet (145 mg total) by mouth daily. INSTRUCTIONS: Work on a low fat, heart healthy diet and participate in regular aerobic exercise program by working out at least 150 minutes per week. No fried foods. No junk foods, sodas, sugary drinks, unhealthy snacking, alcohol or smoking.    Essential hypertension -     lisinopril (PRINIVIL,ZESTRIL) 10 MG tablet; Take 1 tablet (10 mg total) by mouth daily. Continue all antihypertensives as prescribed.  Remember to bring in your blood pressure log with you for your follow up appointment.  DASH/Mediterranean Diets are healthier choices for HTN.   Encounter for administration of vaccine -     Pneumococcal polysaccharide vaccine 23-valent greater than or equal to 2yo subcutaneous/IM  Other insomnia -     traZODone (DESYREL) 100 MG tablet; Take 1 tablet (100 mg total) by mouth at bedtime.    Patient has been counseled on age-appropriate routine  health concerns for screening and prevention. These are reviewed and up-to-date. Referrals have been placed accordingly. Immunizations are up-to-date or declined.    Subjective:   Chief Complaint  Patient presents with  . Establish Care    Pt. is here to establish care for diabetes.   . Medication Refill    Pt. stated she need a refill for fluconazole due to keep on getting vaginitis being diabetic. Pt. is asking if she can get someting for sleep due to she fell recently on her birthday.    HPI Catherine Fowler 67 y.o. female presents to office today to establish care.  She has a history of poorly controlled DM, HPL and HTN for which she has not been on an antihypertensive. Upon entering the exam room she stated "I thought you would be a white jewish woman with the name Rahil Passey".    Diabetes Mellitus Type 2 Chronic. Not well controlled. Can not take metformin due to CKD. She is currently taking glipizide 58m BID and Lantus 50units BID. She is not diet or exercise compliant. She checks her blood sugars "every couple of days". Average FBS 130-140s. She states she does not check her blood sugars every day because "it doesn't matter. It's not going to change anything". When I attempted to discuss her poorly controlled diabetes she interrupted me stating "oh this is better than it has been so I'm happy". I attempted to discuss dietary modifications with  her and she interrupted me again stating her a1c is not as high as it has been. She does not seem to be completely on board with lowering her A1c below 7. She endorses chronic vaginitis however her last actual test for vaginitis was over 1 year ago. She is requesting diflucan with a refill however states she is currently asymptomatic. I explained to her that I can not order her a prescription medication for vaginitis unless she has been tested. She then stated "oh I see you're a good two shoes". She was very upset that I would not follow her previous  provider's method of prescribing diflucan based solely on her complaints. I have offered her the opportunity to make a lab appointment the next time she experiences symptoms and I will be glad to prescribe her medication based on the lab results.  Lab Results  Component Value Date   HGBA1C 9.5 (A) 05/07/2018    Essential Hypertension Appears to be chronic. Will start lisinopril today. She does have a history of CKD and has 1 kidney.  Will check renal function in 2-3 weeks. Denies chest pain, shortness of breath, palpitations, lightheadedness, dizziness, headaches or BLE edema.  BP Readings from Last 3 Encounters:  05/07/18 (!) 142/74  10/09/17 (!) 146/78  07/10/17 (!) 160/70   Insomnia She endorses bilateral leg pain which is keeping her up at night. Reports she fell over a bench 2 months ago when she was in [place]. She is requesting valium to help her sleep. I have instructed her that I do not prescribe benzodiazepines for sleep but will be more than happy to prescribe her trazodone. She endorses sharp stabbing pain in the left leg that is keeping her up at night. She feels these symptoms are from her accident in [place]. The pain may be more related to her poorly controlled diabetes.   Hyperlipidemia Patient presents for follow up to hyperlipidemia.  She is medication compliant taking tricor. She is not diet compliant and denies skin xanthelasma or statin intolerance including myalgias. Awaiting fasting lipid panel. She is not fasting today.  Lab Results  Component Value Date   CHOL 170 11/22/2017   Lab Results  Component Value Date   HDL 34 (L) 11/22/2017   Lab Results  Component Value Date   LDLCALC 88 11/22/2017   Lab Results  Component Value Date   TRIG 239 (H) 11/22/2017   Lab Results  Component Value Date   CHOLHDL 5.0 (H) 11/22/2017   No results found for: LDLDIRECT Review of Systems  Constitutional: Negative for fever, malaise/fatigue and weight loss.  HENT:  Negative.  Negative for nosebleeds.   Eyes: Negative.  Negative for blurred vision, double vision and photophobia.  Respiratory: Negative.  Negative for cough and shortness of breath.   Cardiovascular: Negative.  Negative for chest pain, palpitations and leg swelling.  Gastrointestinal: Negative.  Negative for heartburn, nausea and vomiting.  Musculoskeletal: Negative.  Negative for myalgias.  Neurological: Negative.  Negative for dizziness, focal weakness, seizures and headaches.  Psychiatric/Behavioral: Negative for suicidal ideas. The patient has insomnia.     Past Medical History:  Diagnosis Date  . Chronic kidney disease    lost kidney to a kidney stone.   . Colon cancer ([place]) 09/18/13  . Diabetes mellitus     Past Surgical History:  Procedure Laterality Date  . ABDOMINAL SURGERY    . APPENDECTOMY    . BREAST SURGERY  2/97   breast reduction   . CHOLECYSTECTOMY    .  GASTRIC BYPASS  10/16/2004  . HERNIA REPAIR    . RHINOPLASTY    . TUBAL LIGATION    . VENTRAL HERNIA REPAIR  06/17/2012   Procedure: HERNIA REPAIR VENTRAL ADULT;  Surgeon: Adin Hector, MD;  Location: [facility];  Service: General;  Laterality: N/A;    Family History  Problem Relation Age of Onset  . Diabetes type II Father   . Diabetes type II Other   . Diabetes type II Sister   . Diabetes type II Maternal Aunt     Social History Reviewed with no changes to be made today.   Outpatient Medications Prior to Visit  Medication Sig Dispense Refill  . fluconazole (DIFLUCAN) 150 MG tablet Take 1 tablet (150 mg total) by mouth as needed. 2 tablet 1  . glucose blood (WAVESENSE PRESTO) test strip Use as instructed 100 each 12  . glucose monitoring kit (FREESTYLE) monitoring kit 1 each by Does not apply route 4 (four) times daily - after meals and at bedtime. 1 month Diabetic Testing Supplies for QAC-QHS accuchecks. Any brand okay 1 each 1  . fenofibrate (TRICOR) 145 MG tablet Take 1 tablet (145 mg total) by mouth  daily. 90 tablet 3  . glipiZIDE (GLUCOTROL) 10 MG tablet Take 1 tablet (10 mg total) by mouth 2 (two) times daily before a meal. 180 tablet 3  . Insulin Glargine (LANTUS SOLOSTAR) 100 UNIT/ML Solostar Pen Inject 50 Units into the skin 2 (two) times daily. 30 mL 3  . HYDROcodone-acetaminophen (NORCO/VICODIN) 5-325 MG tablet Take 2 tablets by mouth every 6 (six) hours as needed. (Patient not taking: Reported on 05/07/2018) 16 tablet 0   No facility-administered medications prior to visit.     Allergies  Allergen Reactions  . Pravastatin Sodium Itching and Rash  . Simvastatin Rash       Objective:    BP (!) 142/74 (BP Location: Right Arm, Patient Position: Sitting, Cuff Size: Large)   Pulse 82   Temp 98.8 F (37.1 C) (Oral)   Ht '5\' 7"'  (1.702 m)   Wt 238 lb 14.4 oz (108.4 kg)   SpO2 92%   BMI 37.42 kg/m  Wt Readings from Last 3 Encounters:  05/07/18 238 lb 14.4 oz (108.4 kg)  10/09/17 233 lb (105.7 kg)  07/10/17 235 lb 9.6 oz (106.9 kg)    Physical Exam  Constitutional: She is oriented to person, place, and time. She appears well-developed and well-nourished. She is cooperative.  HENT:  Head: Normocephalic and atraumatic.  Eyes: EOM are normal.  Neck: Normal range of motion.  Cardiovascular: Normal rate, regular rhythm and normal heart sounds. Exam reveals no gallop and no friction rub.  No murmur heard. Pulmonary/Chest: Effort normal and breath sounds normal. No tachypnea. No respiratory distress. She has no decreased breath sounds. She has no wheezes. She has no rhonchi. She has no rales. She exhibits no tenderness.  Abdominal: Bowel sounds are normal.  Musculoskeletal: Normal range of motion. She exhibits no edema.  There are no visible marking or bruises on patient's legs from fall.   Neurological: She is alert and oriented to person, place, and time. Coordination normal.  Skin: Skin is warm and dry.  Psychiatric: She has a normal mood and affect. Her behavior is normal.  Judgment and thought content normal.  Nursing note and vitals reviewed.     Patient has been counseled extensively about nutrition and exercise as well as the importance of adherence with medications and regular follow-up. The patient  was given clear instructions to go to ER or return to medical center if symptoms don't improve, worsen or new problems develop. The patient verbalized understanding.   Follow-up: Return for F/U 3 weeks BP and BMP.   Gildardo Pounds, FNP-BC [facility] and [place], [place]   05/07/2018, 7:14 PM

## 2018-05-08 LAB — MICROALBUMIN / CREATININE URINE RATIO
CREATININE, UR: 141.3 mg/dL
MICROALBUM., U, RANDOM: 720.7 ug/mL
Microalb/Creat Ratio: 510 mg/g creat — ABNORMAL HIGH (ref 0.0–30.0)

## 2018-05-14 ENCOUNTER — Telehealth: Payer: Self-pay

## 2018-05-14 NOTE — Telephone Encounter (Signed)
-----   Message from Gildardo Pounds, NP sent at 05/11/2018  1:24 PM EDT ----- Your labs show that your kidneys are being affected by your diabetes. Please take your lisinopril as prescribed as this provides some protection for the kidneys

## 2018-05-14 NOTE — Telephone Encounter (Signed)
CMA attempt to call patient to inform on lab results.  No answer and left a VM patient to call back.  If patient call back, please inform:  Your labs show that your kidneys are being affected by your diabetes. Please take your lisinopril as prescribed as this provides some protection for the kidneys

## 2018-05-26 MED FILL — FENOFIBRATE 145 MG TABLET: 145 | 30 days supply | Qty: 30 | Fill #10

## 2018-05-26 MED FILL — glipiZIDE 10 MG TABS: 10 | 30 days supply | Qty: 60 | Fill #1

## 2018-05-30 ENCOUNTER — Ambulatory Visit: Payer: Medicare Other | Attending: Nurse Practitioner | Admitting: Pharmacist

## 2018-05-30 ENCOUNTER — Encounter: Payer: Self-pay | Admitting: Pharmacist

## 2018-05-30 ENCOUNTER — Other Ambulatory Visit: Payer: Self-pay | Admitting: Nurse Practitioner

## 2018-05-30 VITALS — BP 132/74 | HR 75

## 2018-05-30 DIAGNOSIS — Z6837 Body mass index (BMI) 37.0-37.9, adult: Secondary | ICD-10-CM | POA: Diagnosis not present

## 2018-05-30 DIAGNOSIS — N189 Chronic kidney disease, unspecified: Secondary | ICD-10-CM | POA: Insufficient documentation

## 2018-05-30 DIAGNOSIS — E1122 Type 2 diabetes mellitus with diabetic chronic kidney disease: Secondary | ICD-10-CM | POA: Diagnosis not present

## 2018-05-30 DIAGNOSIS — E119 Type 2 diabetes mellitus without complications: Secondary | ICD-10-CM | POA: Diagnosis not present

## 2018-05-30 DIAGNOSIS — E785 Hyperlipidemia, unspecified: Secondary | ICD-10-CM | POA: Diagnosis not present

## 2018-05-30 DIAGNOSIS — E669 Obesity, unspecified: Secondary | ICD-10-CM | POA: Insufficient documentation

## 2018-05-30 DIAGNOSIS — I10 Essential (primary) hypertension: Secondary | ICD-10-CM | POA: Diagnosis not present

## 2018-05-30 DIAGNOSIS — I129 Hypertensive chronic kidney disease with stage 1 through stage 4 chronic kidney disease, or unspecified chronic kidney disease: Secondary | ICD-10-CM | POA: Insufficient documentation

## 2018-05-30 LAB — GLUCOSE, POCT (MANUAL RESULT ENTRY): POC GLUCOSE: 252 mg/dL — AB (ref 70–99)

## 2018-05-30 NOTE — Progress Notes (Signed)
S:    PCP: Geryl Rankins  No chief complaint on file.  Patient arrives in good spirits. Presents for diabetes and HTN management at the request of Zelda. Patient was referred on 05/07/18 and was last seen by PCP on that day. Patient denies palpitations, dizziness, shortness of breath, chest pain.  PMH: DM, hyperlipidemia, HTN, CKD (1 kidney - eGFR 42 in 11/2017), obesity (BMI 37.42)  HPI:  Patient had PCP visit on 05/07/18. At this time, her BP was 142/74. Lisinopril 10 mg daily was initiated for BP and microalbuminuria associated with DM. Her A1c was 9.5 and POC glucose was 167. Patient reported taking home sugars every couple days and was not amenable to checking daily. She was not on board with managing diabetes to an A1c goal < 7 - told Zelda "it's better than it was so I'm happy". Pneumovax was also given during this visit.  Family/Social History:  - Father (deceased): DM  - Mother (deceased): DM - Sister: DM - Tobacco: never smoker - Alcohol: occasionally, specific amount/frequency not given.  Insurance coverage/medication affordability: Pt has Medicare A/B but no prescription drug coverage   1. DM:  Patient reports diabetes was diagnosed about 5 years ago.   Patient reports adherence with medications.  Current diabetes medications include:  - Glipizide 10 mg BID - Lantus 50 units BID  Patient denies hypoglycemic events.  Patient reported dietary habits: Patient is aware of a diabetic- and DASH-based diet, was given resources at a previous pharmacist visit.   Patient reports nocturia. Patient reports occurrence of once a night and does not suspect it to be due to her sugar. Patient denies neuropathy. Patient denies visual changes.  2. HTN  Pt denies HA, dizziness, chest pain, or palpitations at this time.  Patient reports adherence with medications.  Current hypertension medications include:  - lisinopril 10 mg daily  Social history: pertinent social negatives noted  above.  DASH diet compliant: reports not adding salt to any food  O:  POCT glucose: 252. Of note, patient ate a breakfast sandwich and home fries within 30 min of arrival to appointment.  BP: 132/74 taken after 5 min rest in left arm.   Last 3 Office BP readings: BP Readings from Last 3 Encounters:  05/07/18 (!) 142/74  10/09/17 (!) 146/78  07/10/17 (!) 160/70   BMET    Component Value Date/Time   NA 143 11/22/2017 1421   K 4.4 11/22/2017 1421   CL 105 11/22/2017 1421   CO2 28 11/22/2017 1421   GLUCOSE 211 (H) 11/22/2017 1421   GLUCOSE 206 (H) 04/29/2017 1734   BUN 15 11/22/2017 1421   CREATININE 1.31 (H) 11/22/2017 1421   CREATININE 1.21 (H) 11/08/2016 1229   CALCIUM 9.5 11/22/2017 1421   GFRNONAA 42 (L) 11/22/2017 1421   GFRNONAA 46 (L) 11/08/2016 1229   GFRAA 48 (L) 11/22/2017 1421   GFRAA 53 (L) 11/08/2016 1229   Lab Results  Component Value Date   HGBA1C 9.5 (A) 05/07/2018   There were no vitals filed for this visit.  Lipid Panel     Component Value Date/Time   CHOL 170 11/22/2017 1421   TRIG 239 (H) 11/22/2017 1421   HDL 34 (L) 11/22/2017 1421   CHOLHDL 5.0 (H) 11/22/2017 1421   CHOLHDL 6.4 (H) 11/08/2016 1229   VLDL 78 (H) 11/08/2016 1229   LDLCALC 88 11/22/2017 1421   Patient did not bring home glucose log but reported home sugars between 120s - 130s with  none above 160.  Clinical ASCVD: No  10 year ASCVD risk: 26.3 % Risk Factors: CKD, albuminuria, TG >175, DM  A/P: 1. DM Diabetes longstanding currently uncontrolled. Patient is adherent with medication. Control is suboptimal due to dietary indiscretion. Cannot take metformin d/t CKD. Additionally, we reinforced to the patient the importance of glycemic control. Covered glycemic goals and correlated with outcomes. Patient states she was unaware of the benefit of additional A1c lowering. She was happy that A1c was lower than previous reading but did not know she had room to improve. This, injection  technique, and concurrent therapies along with their roles in morbidity/mortality emphasized. Patient appears more agreeable but would like to address these over a period of time.  -Continued basal insulin Lantus (insulin glargine) at 50 units BID. -Continued glipizide 10 mg BID.  -Extensively discussed pathophysiology of DM, recommended lifestyle interventions, dietary effects on glycemic control -Next A1C anticipated 07/2018.   ASCVD risk - primary prevention in patient with DM. ASCVD risk score is >20%. Patient has multiple risk factors that increase her chances for clinical ASCVD. Those are listed above. Patient would benefit from statin therapy. Of note, LDL was elevated ~4 yrs ago at 139. A 50% reduction would yield a result ~70. Patient's last LDL was 88 (11/2017) so she has room for improvement. Patient reported hives/rash with trial of pravastatin and simvastatin. After discussing other members of this class, patient seems agreeable to possibly starting a statin in the future.   2. HTN Hypertension longstanding currently controlled. BP goal <130/80 mmHg. Patient is adherent with medication. Lisinopril appropriate for BP control and for benefit with microalb present in patient.  -Continue lisinopril 10 mg daily -BMP - PCP follow-up concerning results  Written patient instructions provided.  Total time in face to face counseling 30 minutes.   Follow up PCP Clinic Visit in 08/08/18.    Ricardo Jericho, PharmD Candidate [employer] Class of 2020  Benard Halsted, PharmD, [place] [phone]

## 2018-05-30 NOTE — Patient Instructions (Addendum)
Thank you for coming to see [place] today. Please remember to do the following:  1. Follow-up with your PCP concerning the blood draw today. You have an appointment 08/08/18. If you want to come see me before then, please make an appointment. Otherwise, I can follow-up with you in the future.   2. Please continue lisinopril 10 mg daily. Blood pressure looks great! You will receive a call regarding blood work in the near future.   3. Continue glipizide and Lantus. I can help you manage these in the future if needed.   4. Remember to make heart-healthy decisions regarding diet and salt intake.   5. Cholesterol and cardiovascular risk management can be addressed in the future.    If you need me, feel free to call my line directly with any questions or concerns. [phone].   Lurena Joiner

## 2018-05-31 LAB — BASIC METABOLIC PANEL
BUN / CREAT RATIO: 14 (ref 12–28)
BUN: 22 mg/dL (ref 8–27)
CO2: 19 mmol/L — ABNORMAL LOW (ref 20–29)
CREATININE: 1.52 mg/dL — AB (ref 0.57–1.00)
Calcium: 9 mg/dL (ref 8.7–10.3)
Chloride: 105 mmol/L (ref 96–106)
GFR calc Af Amer: 40 mL/min/{1.73_m2} — ABNORMAL LOW (ref >59)
GFR calc non Af Amer: 35 mL/min/{1.73_m2} — ABNORMAL LOW (ref >59)
Glucose: 240 mg/dL — ABNORMAL HIGH (ref 65–99)
Potassium: 5 mmol/L (ref 3.5–5.2)
SODIUM: 139 mmol/L (ref 134–144)

## 2018-06-02 ENCOUNTER — Telehealth: Payer: Self-pay

## 2018-06-02 NOTE — Telephone Encounter (Signed)
-----   Message from Gildardo Pounds, NP sent at 06/01/2018  9:42 PM EDT ----- Kidney function has worsened. Have you seen a kidney specialist in the past or currently seeing one? If not I will need to refer you.

## 2018-06-02 NOTE — Telephone Encounter (Signed)
CMA spoke to patient to inform on lab results.  Patient verified DOB. Patient understood.  Patient stated she is not and was not seeing any kidney specialist.   Patient is aware PCP will refer her out for kidney specialist.

## 2018-06-03 MED FILL — FLUCONAZOLE 150 MG TABS: 150 | 2 days supply | Qty: 2 | Fill #1

## 2018-06-03 MED FILL — traZODone HCL 100 MG TABS: 100 | 30 days supply | Qty: 30 | Fill #1

## 2018-06-03 MED FILL — LISINOPRIL 10 MG TABS: 10 | 30 days supply | Qty: 30 | Fill #1

## 2018-06-03 MED FILL — glipiZIDE 10 MG TABS: 10 | 30 days supply | Qty: 60 | Fill #10

## 2018-06-05 ENCOUNTER — Other Ambulatory Visit: Payer: Self-pay | Admitting: Nurse Practitioner

## 2018-06-05 DIAGNOSIS — N183 Chronic kidney disease, stage 3 unspecified: Secondary | ICD-10-CM

## 2018-07-01 MED FILL — FENOFIBRATE 145 MG TAB: 145 | 30 days supply | Qty: 30 | Fill #11

## 2018-08-08 ENCOUNTER — Encounter: Payer: Self-pay | Admitting: Nurse Practitioner

## 2018-08-08 ENCOUNTER — Ambulatory Visit: Payer: Medicare Other | Attending: Nurse Practitioner | Admitting: Nurse Practitioner

## 2018-08-08 ENCOUNTER — Telehealth: Payer: Self-pay

## 2018-08-08 VITALS — BP 132/84 | HR 89 | Temp 98.2°F | Ht 67.0 in | Wt 236.0 lb

## 2018-08-08 DIAGNOSIS — Z9884 Bariatric surgery status: Secondary | ICD-10-CM | POA: Diagnosis not present

## 2018-08-08 DIAGNOSIS — I10 Essential (primary) hypertension: Secondary | ICD-10-CM

## 2018-08-08 DIAGNOSIS — Z23 Encounter for immunization: Secondary | ICD-10-CM | POA: Insufficient documentation

## 2018-08-08 DIAGNOSIS — Z9114 Patient's other noncompliance with medication regimen: Secondary | ICD-10-CM | POA: Diagnosis not present

## 2018-08-08 DIAGNOSIS — Z833 Family history of diabetes mellitus: Secondary | ICD-10-CM | POA: Diagnosis not present

## 2018-08-08 DIAGNOSIS — Z794 Long term (current) use of insulin: Secondary | ICD-10-CM | POA: Diagnosis not present

## 2018-08-08 DIAGNOSIS — E1122 Type 2 diabetes mellitus with diabetic chronic kidney disease: Secondary | ICD-10-CM | POA: Insufficient documentation

## 2018-08-08 DIAGNOSIS — N189 Chronic kidney disease, unspecified: Secondary | ICD-10-CM | POA: Insufficient documentation

## 2018-08-08 DIAGNOSIS — Z87442 Personal history of urinary calculi: Secondary | ICD-10-CM | POA: Diagnosis not present

## 2018-08-08 DIAGNOSIS — Z9049 Acquired absence of other specified parts of digestive tract: Secondary | ICD-10-CM | POA: Diagnosis not present

## 2018-08-08 DIAGNOSIS — I129 Hypertensive chronic kidney disease with stage 1 through stage 4 chronic kidney disease, or unspecified chronic kidney disease: Secondary | ICD-10-CM | POA: Diagnosis not present

## 2018-08-08 DIAGNOSIS — Z79899 Other long term (current) drug therapy: Secondary | ICD-10-CM | POA: Insufficient documentation

## 2018-08-08 DIAGNOSIS — E1165 Type 2 diabetes mellitus with hyperglycemia: Secondary | ICD-10-CM | POA: Insufficient documentation

## 2018-08-08 DIAGNOSIS — Z780 Asymptomatic menopausal state: Secondary | ICD-10-CM

## 2018-08-08 DIAGNOSIS — Z85038 Personal history of other malignant neoplasm of large intestine: Secondary | ICD-10-CM | POA: Insufficient documentation

## 2018-08-08 DIAGNOSIS — Z09 Encounter for follow-up examination after completed treatment for conditions other than malignant neoplasm: Secondary | ICD-10-CM | POA: Diagnosis not present

## 2018-08-08 DIAGNOSIS — Z9111 Patient's noncompliance with dietary regimen: Secondary | ICD-10-CM | POA: Diagnosis not present

## 2018-08-08 DIAGNOSIS — E785 Hyperlipidemia, unspecified: Secondary | ICD-10-CM | POA: Diagnosis not present

## 2018-08-08 LAB — POCT GLYCOSYLATED HEMOGLOBIN (HGB A1C): Hemoglobin A1C: 9.3 % — AB (ref 4.0–5.6)

## 2018-08-08 LAB — GLUCOSE, POCT (MANUAL RESULT ENTRY): POC Glucose: 135 mg/dl — AB (ref 70–99)

## 2018-08-08 MED ORDER — FENOFIBRATE 145 MG PO TABS: 145.0000 mg | ORAL_TABLET | Freq: Every day | ORAL | 3 refills | Status: DC

## 2018-08-08 MED FILL — FENOFIBRATE 145 MG TABLET: 145 | 30 days supply | Qty: 30 | Fill #0

## 2018-08-08 NOTE — Telephone Encounter (Signed)
CMA spoke to patient to inform to she have lab work that was ordered by PCP.  Patient understood she can stop by anytime to get her labs done.   Patient understood.

## 2018-08-08 NOTE — Patient Instructions (Addendum)
Sent referral to [facility]  Ph. # [phone] Address [address] [place] [address] .They will contact the patient to schedule an appointment  Diabetes blood sugar goals  Fasting in AM before breakfast which means at least 8 hrs of no eating or drinking) except water or unsweetened coffee or tea): 90-130 2 hrs after meals: < 180,   Hypoglycemia or low blood sugar: < 70 (You should not have hypoglycemia.)  Aim for 30 minutes of exercise most days. Rethink what you drink. Water is great! Aim for 2-3 Carb Choices per meal (30-45 grams) +/- 1 either way  Aim for 0-15 Carbs per snack if hungry  Include protein in moderation with your meals and snacks  Consider reading food labels for Total Carbohydrate and Fat Grams of foods  Consider checking BG at alternate times per day  Continue taking medication as directed Be mindful about how much sugar you are adding to beverages and other foods. Fruit Punch - find one with no sugar  Measure and decrease portions of carbohydrate foods  Make your plate and don't go back for seconds

## 2018-08-08 NOTE — Progress Notes (Signed)
Assessment & Plan:  Catherine Fowler was seen today for follow-up.  Diagnoses and all orders for this visit:  Uncontrolled type 2 diabetes mellitus with hyperglycemia (HCC) -     Glucose (CBG) -     HgB A1c -     Referral to Nutrition and Diabetes Services -     CMP14+EGFR -     TSH Continue blood sugar control as discussed in office today, low carbohydrate diet, and regular physical exercise as tolerated, 150 minutes per week (30 min each day, 5 days per week, or 50 min 3 days per week). Keep blood sugar logs with fasting goal of 90-130 mg/dl, post prandial (after you eat) less than 180.  For Hypoglycemia: BS <60 and Hyperglycemia BS >400; contact the clinic ASAP. Annual eye exams and foot exams are recommended.   Essential hypertension Continue all antihypertensives as prescribed.  Remember to bring in your blood pressure log with you for your follow up appointment.  DASH/Mediterranean Diets are healthier choices for HTN.     Dyslipidemia -     fenofibrate (TRICOR) 145 MG tablet; Take 1 tablet (145 mg total) by mouth daily.  Postmenopausal estrogen deficiency -     DG Bone Density; Future  Need for immunization against influenza -     Flu Vaccine QUAD 36+ mos IM    Patient has been counseled on age-appropriate routine health concerns for screening and prevention. These are reviewed and up-to-date. Referrals have been placed accordingly. Immunizations are up-to-date or declined.    Subjective:   Chief Complaint  Patient presents with  . Follow-up    Pt. is here to follow-up on diabetes.    HPI Catherine Fowler 67 y.o. female presents to office today for follow to DM and HPL.  Type 2 Diabetes Mellitus History of noncompliance with diet and medication. Can not take metformin due to CKD. Disease course has been unchanged and poorly controlled. There are no hypoglycemic symptoms. There are no hypoglycemic complications.  There are diabetic complications. Risk factors for coronary  artery disease include family history, dyslipidemia, diabetes mellitus, obesity, hypertension, sedentary lifestyle. Current diabetic treatment includes Lantus 50units BID, Glipizide 39m BID. Patient endorses compliance with treatment all of the time and monitors blood glucose at home every 3-4 days.  I have instructed her to monitor her glucose levels in the morning fasting and once after a meal daily.  Home blood glucose trend : (FBS 100-140 mg/dl)  Weight is  Stable.  Patient endorses following a generally healthy diet. Meal planning includes avoidance of concentrated sweets. Patient has not seen a dietician in several years. Patient is compliant with exercise and states she has been exercising daily on treadmill.   An ACE inhibitor/angiotensin II receptor blocker is being taken. Patient does not see a podiatrist. Eye exam is not current. She has reduced her portion sizes. She is avoiding sugery sweets.She is evercing several times per week.  Lab Results  Component Value Date   HGBA1C 9.3 (A) 08/08/2018   Lab Results  Component Value Date   HGBA1C 9.5 (A) 05/07/2018    CHRONIC HYPERTENSION Disease Monitoring  Blood pressure range BP Readings from Last 3 Encounters:  08/08/18 132/84  05/30/18 132/74  05/07/18 (!) 142/74   Chest pain: no   Dyspnea: no   Claudication: no  Medication compliance: yes  Medication Side Effects  Lightheadedness: no   Urinary frequency: no   Edema: no   Impotence: no  Preventitive Healthcare:  Exercise: yes  Diet Pattern: diet: low fat and low protein  Salt Restriction:  Yes  Hyperlipidemia Patient presents for follow up to hyperlipidemia.  She is medication compliant taking tricor. Endorses a statin intolerance.  Lab Results  Component Value Date   CHOL 170 11/22/2017   Lab Results  Component Value Date   HDL 34 (L) 11/22/2017   Lab Results  Component Value Date   LDLCALC 88 11/22/2017   Lab Results  Component Value Date   TRIG 239 (H)  11/22/2017   Lab Results  Component Value Date   CHOLHDL 5.0 (H) 11/22/2017      Review of Systems  Constitutional: Negative for fever, malaise/fatigue and weight loss.  HENT: Negative.  Negative for nosebleeds.   Eyes: Negative.  Negative for blurred vision, double vision and photophobia.  Respiratory: Negative.  Negative for cough and shortness of breath.   Cardiovascular: Negative.  Negative for chest pain, palpitations and leg swelling.  Gastrointestinal: Negative.  Negative for heartburn, nausea and vomiting.  Musculoskeletal: Negative.  Negative for myalgias.  Neurological: Negative.  Negative for dizziness, focal weakness, seizures and headaches.  Psychiatric/Behavioral: Negative.  Negative for suicidal ideas.    Past Medical History:  Diagnosis Date  . Chronic kidney disease    lost kidney to a kidney stone.   . Colon cancer ([place]) 09/18/13  . Diabetes mellitus     Past Surgical History:  Procedure Laterality Date  . ABDOMINAL SURGERY    . APPENDECTOMY    . BREAST SURGERY  2/97   breast reduction   . CHOLECYSTECTOMY    . GASTRIC BYPASS  10/16/2004  . HERNIA REPAIR    . RHINOPLASTY    . TUBAL LIGATION    . VENTRAL HERNIA REPAIR  06/17/2012   Procedure: HERNIA REPAIR VENTRAL ADULT;  Surgeon: Adin Hector, MD;  Location: [facility];  Service: General;  Laterality: N/A;    Family History  Problem Relation Age of Onset  . Diabetes type II Father   . Diabetes type II Other   . Diabetes type II Sister   . Diabetes type II Maternal Aunt     Social History Reviewed with no changes to be made today.   Outpatient Medications Prior to Visit  Medication Sig Dispense Refill  . glipiZIDE (GLUCOTROL) 10 MG tablet Take 1 tablet (10 mg total) by mouth 2 (two) times daily before a meal. 180 tablet 3  . glucose blood (WAVESENSE PRESTO) test strip Use as instructed 100 each 12  . glucose monitoring kit (FREESTYLE) monitoring kit 1 each by Does not apply route 4 (four) times  daily - after meals and at bedtime. 1 month Diabetic Testing Supplies for QAC-QHS accuchecks. Any brand okay 1 each 1  . Insulin Glargine (LANTUS SOLOSTAR) 100 UNIT/ML Solostar Pen Inject 50 Units into the skin 2 (two) times daily. 30 mL 6  . fenofibrate (TRICOR) 145 MG tablet Take 1 tablet (145 mg total) by mouth daily. 90 tablet 3  . lisinopril (PRINIVIL,ZESTRIL) 10 MG tablet Take 1 tablet (10 mg total) by mouth daily. (Patient not taking: Reported on 08/08/2018) 30 tablet 1  . traZODone (DESYREL) 100 MG tablet Take 1 tablet (100 mg total) by mouth at bedtime. (Patient not taking: Reported on 08/08/2018) 90 tablet 1  . fluconazole (DIFLUCAN) 150 MG tablet Take 1 tablet (150 mg total) by mouth as needed. (Patient not taking: Reported on 08/08/2018) 2 tablet 1   No facility-administered medications prior to visit.     Allergies  Allergen Reactions  . Pravastatin Sodium Itching and Rash  . Simvastatin Rash       Objective:    BP 132/84 (BP Location: Left Arm, Patient Position: Sitting, Cuff Size: Large)   Pulse 89   Temp 98.2 F (36.8 C) (Oral)   Ht '5\' 7"'  (1.702 m)   Wt 236 lb (107 kg)   SpO2 93%   BMI 36.96 kg/m  Wt Readings from Last 3 Encounters:  08/08/18 236 lb (107 kg)  05/07/18 238 lb 14.4 oz (108.4 kg)  10/09/17 233 lb (105.7 kg)    Physical Exam  Constitutional: She is oriented to person, place, and time. She appears well-developed and well-nourished. She is cooperative.  HENT:  Head: Normocephalic and atraumatic.  Eyes: EOM are normal.  Neck: Normal range of motion.  Cardiovascular: Normal rate, regular rhythm, normal heart sounds and intact distal pulses. Exam reveals no gallop and no friction rub.  No murmur heard. Pulses:      Dorsalis pedis pulses are 2+ on the right side, and 2+ on the left side.       Posterior tibial pulses are 2+ on the right side, and 2+ on the left side.  Pulmonary/Chest: Effort normal and breath sounds normal. No tachypnea. No respiratory  distress. She has no decreased breath sounds. She has no wheezes. She has no rhonchi. She has no rales. She exhibits no tenderness.  Abdominal: Soft. Bowel sounds are normal.  Musculoskeletal: Normal range of motion. She exhibits no edema.  Feet:  Right Foot:  Protective Sensation: 10 sites tested. 10 sites sensed.  Skin Integrity: Negative for skin breakdown.  Left Foot:  Protective Sensation: 10 sites tested. 10 sites sensed.  Skin Integrity: Negative for skin breakdown.  Neurological: She is alert and oriented to person, place, and time. Coordination normal.  Skin: Skin is warm and dry.  Psychiatric: She has a normal mood and affect. Her behavior is normal. Judgment and thought content normal.  Nursing note and vitals reviewed.        Patient has been counseled extensively about nutrition and exercise as well as the importance of adherence with medications and regular follow-up. The patient was given clear instructions to go to ER or return to medical center if symptoms don't improve, worsen or new problems develop. The patient verbalized understanding.   Follow-up: Return in 4 weeks (on 09/05/2018) for bring meter and log; she can see LUKE .   Gildardo Pounds, FNP-BC [facility] [place], [place]   08/08/2018, 12:46 PM

## 2018-08-14 ENCOUNTER — Ambulatory Visit: Payer: Medicare Other | Attending: Nurse Practitioner

## 2018-08-14 ENCOUNTER — Telehealth: Payer: Self-pay | Admitting: Nurse Practitioner

## 2018-08-14 DIAGNOSIS — E1165 Type 2 diabetes mellitus with hyperglycemia: Secondary | ICD-10-CM | POA: Insufficient documentation

## 2018-08-14 NOTE — Telephone Encounter (Signed)
Pt came in to request information on 0 -Dyslipidemia

## 2018-08-14 NOTE — Progress Notes (Signed)
Patient her for lab visit only 

## 2018-08-15 LAB — CMP14+EGFR
ALT: 25 IU/L (ref 0–32)
AST: 30 IU/L (ref 0–40)
Albumin/Globulin Ratio: 1.8 (ref 1.2–2.2)
Albumin: 4.2 g/dL (ref 3.6–4.8)
Alkaline Phosphatase: 105 IU/L (ref 39–117)
BUN/Creatinine Ratio: 11 — ABNORMAL LOW (ref 12–28)
BUN: 15 mg/dL (ref 8–27)
Bilirubin Total: 0.2 mg/dL (ref 0.0–1.2)
CO2: 24 mmol/L (ref 20–29)
Calcium: 9.6 mg/dL (ref 8.7–10.3)
Chloride: 104 mmol/L (ref 96–106)
Creatinine, Ser: 1.38 mg/dL — ABNORMAL HIGH (ref 0.57–1.00)
GFR calc Af Amer: 45 mL/min/{1.73_m2} — ABNORMAL LOW (ref >59)
GFR calc non Af Amer: 39 mL/min/{1.73_m2} — ABNORMAL LOW (ref >59)
Globulin, Total: 2.3 g/dL (ref 1.5–4.5)
Glucose: 204 mg/dL — ABNORMAL HIGH (ref 65–99)
Potassium: 4.5 mmol/L (ref 3.5–5.2)
Sodium: 144 mmol/L (ref 134–144)
Total Protein: 6.5 g/dL (ref 6.0–8.5)

## 2018-08-15 LAB — TSH: TSH: 0.502 u[IU]/mL (ref 0.450–4.500)

## 2018-08-28 ENCOUNTER — Telehealth: Payer: Self-pay | Admitting: Nurse Practitioner

## 2018-08-28 NOTE — Telephone Encounter (Signed)
Pt came in to request information on 1. -Dyslipidemia Please call patient as son as possibles because she came in on 08/14/2018 with the same question and no one has gotten back with her.

## 2018-08-31 NOTE — Telephone Encounter (Signed)
What is the patient's question?

## 2018-09-01 NOTE — Telephone Encounter (Signed)
Pt. Asked why was Dyslipidemia was on her after visit summary for one of the diagnosis.

## 2018-09-02 MED FILL — FENOFIBRATE 145 MG TABLET: 145 | 30 days supply | Qty: 30 | Fill #1

## 2018-09-04 NOTE — Telephone Encounter (Signed)
Patient has a history of abnormal cholesterol levels.

## 2018-09-04 NOTE — Telephone Encounter (Signed)
CMA spoke to patient to inform on PCP advising. Patient was upset due to that was not her concern and stated she is not happy with the customer service that she was getting. Pt. Stated she may not come back to the clinic.  CMA offer to send her question to the PCP, but denied.

## 2018-09-05 ENCOUNTER — Encounter: Payer: Self-pay | Admitting: Pharmacist

## 2018-09-05 ENCOUNTER — Ambulatory Visit: Payer: Medicare Other | Attending: Nurse Practitioner | Admitting: Pharmacist

## 2018-09-05 DIAGNOSIS — N189 Chronic kidney disease, unspecified: Secondary | ICD-10-CM | POA: Insufficient documentation

## 2018-09-05 DIAGNOSIS — E1165 Type 2 diabetes mellitus with hyperglycemia: Secondary | ICD-10-CM | POA: Insufficient documentation

## 2018-09-05 DIAGNOSIS — E669 Obesity, unspecified: Secondary | ICD-10-CM | POA: Diagnosis not present

## 2018-09-05 DIAGNOSIS — E781 Pure hyperglyceridemia: Secondary | ICD-10-CM | POA: Insufficient documentation

## 2018-09-05 DIAGNOSIS — Z6837 Body mass index (BMI) 37.0-37.9, adult: Secondary | ICD-10-CM | POA: Insufficient documentation

## 2018-09-05 DIAGNOSIS — I129 Hypertensive chronic kidney disease with stage 1 through stage 4 chronic kidney disease, or unspecified chronic kidney disease: Secondary | ICD-10-CM | POA: Diagnosis not present

## 2018-09-05 DIAGNOSIS — Z833 Family history of diabetes mellitus: Secondary | ICD-10-CM | POA: Diagnosis not present

## 2018-09-05 DIAGNOSIS — E1122 Type 2 diabetes mellitus with diabetic chronic kidney disease: Secondary | ICD-10-CM | POA: Insufficient documentation

## 2018-09-05 LAB — GLUCOSE, POCT (MANUAL RESULT ENTRY): POC GLUCOSE: 100 mg/dL — AB (ref 70–99)

## 2018-09-05 MED ORDER — LIRAGLUTIDE 18 MG/3ML ~~LOC~~ SOPN: PEN_INJECTOR | SUBCUTANEOUS | 2 refills | Status: DC

## 2018-09-05 NOTE — Patient Instructions (Signed)
Thank you for coming to see me today. Please do the following:  1. Stop glipizide 2. Continue Lantus 50 units 2 times daily.  3. Start Victoza: 0.6 mg in the morning for 1 week, then 1.2 mg in the morning the next week, then 1.8 mg daily thereafter.  4. Continue checking blood sugars at home. 5. Continue making the lifestyle changes. You're doing great! 6. Follow-up with me in 1 month.    Hypoglycemia or low blood sugar:   Low blood sugar can happen quickly and may become an emergency if not treated right away.   While this shouldn't happen often, it can be brought upon if you skip a meal or do not eat enough. Also, if your insulin or other diabetes medications are dosed too high, this can cause your blood sugar to go to low.   Warning signs of low blood sugar include: 1. Feeling shaky or dizzy 2. Feeling weak or tired  3. Excessive hunger 4. Feeling anxious or upset  5. Sweating even when you aren't exercising  What to do if I experience low blood sugar? 1. Check your blood sugar with your meter. If lower than 70, proceed to step 2.  2. Treat with 3-4 glucose tablets or 3 packets of regular sugar. If these aren't around, you can try hard candy. Yet another option would be to drink 4 ounces of fruit juice or 6 ounces of REGULAR soda.  3. Re-check your sugar in 15 minutes. If it is still below 70, do what you did in step 2 again. If has come back up, go ahead and eat a snack or small meal at this time.

## 2018-09-05 NOTE — Progress Notes (Signed)
S:    PCP: Geryl Rankins  No chief complaint on file.  Patient arrives in good spirits. Presents for diabetes management at the request of Zelda. Patient was referred on 08/08/18 and was last seen by PCP on that day.   PMH: DM, hypertriglyceridemia, HTN, CKD (1 kidney - eGFR 42 in 11/2017), obesity (BMI 37.42)  HPI:  Patient had PCP visit on 08/08/18. At that time, Zelda made no changes to medications. POCT glucose at that visit 135 but A1c remains above goal at 9.3. Pt has a history of being difficult to work with. She also has hx of med non-adherence.   Family/Social History:  - Father (deceased): DM, MI  - Mother (deceased): DM - Sister: DM - Tobacco: never smoker - Alcohol: occasionally, specific amount/frequency not given.  Insurance coverage/medication affordability: Pt has Medicare A/B but no prescription drug coverage. Coordinates her patient assistance through the health department.    1. DM:  Patient reports diabetes was diagnosed in 2012.   Patient reports adherence with medications.  Current diabetes medications include:  - Glipizide 10 mg BID - Lantus 50 units BID  Patient reports hypoglycemic events.  Patient reported dietary habits: Patient reports transforming diet since this summer.   Patient reported exercise habits: Reports walking on treadmill 2x/day.   Patient denies polyuria/polydipsia.  Patient denies neuropathy. Patient denies visual changes. Patient reports foot exams.   O:  POCT: 100 Fasting home sugars: 52 - 193 Post prandial/random CBG at home: 143 - 391  Last 3 Office BP readings: BP Readings from Last 3 Encounters:  08/08/18 132/84  05/30/18 132/74  05/07/18 (!) 142/74   BMET    Component Value Date/Time   NA 144 08/14/2018 1233   K 4.5 08/14/2018 1233   CL 104 08/14/2018 1233   CO2 24 08/14/2018 1233   GLUCOSE 204 (H) 08/14/2018 1233   GLUCOSE 206 (H) 04/29/2017 1734   BUN 15 08/14/2018 1233   CREATININE 1.38 (H) 08/14/2018  1233   CREATININE 1.21 (H) 11/08/2016 1229   CALCIUM 9.6 08/14/2018 1233   GFRNONAA 39 (L) 08/14/2018 1233   GFRNONAA 46 (L) 11/08/2016 1229   GFRAA 45 (L) 08/14/2018 1233   GFRAA 53 (L) 11/08/2016 1229   Lab Results  Component Value Date   HGBA1C 9.3 (A) 08/08/2018   There were no vitals filed for this visit.  Lipid Panel     Component Value Date/Time   CHOL 170 11/22/2017 1421   TRIG 239 (H) 11/22/2017 1421   HDL 34 (L) 11/22/2017 1421   CHOLHDL 5.0 (H) 11/22/2017 1421   CHOLHDL 6.4 (H) 11/08/2016 1229   VLDL 78 (H) 11/08/2016 1229   LDLCALC 88 11/22/2017 1421    Clinical ASCVD: No  10 year ASCVD risk: 17.7% Risk Factors: CKD, albuminuria, TG >175, DM  A/P: 1. DM Diabetes longstanding currently uncontrolled. Patient is adherent with medication. Cannot take metformin d/t CKD.   Covered glycemic goals and correlated with outcomes. Patient reports adherence but appears frustrated with having to take medication/check blood sugar daily. When I reviewed her BG log, it suggested that she checks sporadically and not daily. Emphasized the importance of checking fasting and 2-hr post-prandial levels daily. She is amenable to check but states that she won't check daily.   Pt likely to benefit wtih GLP-1 RA. She has no clinical ASCVD but is interested in losing weight. Upon review of her glucose log, she needs additional prandial coverage. Will stop her SU d/t hypoglycemia and  start Victoza. We talked extensively concerning risk/benefit. She is okay to proceed with therapy. Denies personal or fam hx of thyroid cancer. Denies personal hx of pancreatitis.   -Continued basal insulin Lantus (insulin glargine) at 50 units BID. -Started Victoza 0.6 mg daily x1 wk, 1.2 mg daily x1 wk, then 1.8 mg daily thereafter. -Discontinued glipizide 10 mg BID.  -Extensively discussed pathophysiology of DM, recommended lifestyle interventions, dietary effects on glycemic control -Next A1C anticipated  10/2018.   ASCVD risk - primary prevention in patient with DM. She has intolerances to statins. Will address re-starting with another agent at next encounter. Additionally, pt has significant albuminuria w/ CKD and DM. She needs to re-start lisinopril. Will address at next visit.   Written patient instructions provided.  Total time in face to face counseling 30 minutes.   Follow up PharmD Clinic Visit in 10/07/18.    Dixon Boos, PharmD Candidate [employer] Class of 2021  Benard Halsted, PharmD, [place] [phone]

## 2018-09-07 NOTE — Telephone Encounter (Signed)
Thank you :)

## 2018-09-22 ENCOUNTER — Other Ambulatory Visit: Payer: Self-pay | Admitting: Pharmacist

## 2018-10-03 MED FILL — FENOFIBRATE 145 MG TABLET: 145 | 30 days supply | Qty: 30 | Fill #2

## 2018-10-07 ENCOUNTER — Encounter: Payer: Self-pay | Admitting: Pharmacist

## 2018-10-07 ENCOUNTER — Ambulatory Visit: Payer: Medicare Other | Attending: Nurse Practitioner | Admitting: Pharmacist

## 2018-10-07 DIAGNOSIS — Z794 Long term (current) use of insulin: Secondary | ICD-10-CM | POA: Diagnosis not present

## 2018-10-07 DIAGNOSIS — E781 Pure hyperglyceridemia: Secondary | ICD-10-CM | POA: Insufficient documentation

## 2018-10-07 DIAGNOSIS — E1165 Type 2 diabetes mellitus with hyperglycemia: Secondary | ICD-10-CM | POA: Diagnosis not present

## 2018-10-07 DIAGNOSIS — E669 Obesity, unspecified: Secondary | ICD-10-CM | POA: Insufficient documentation

## 2018-10-07 DIAGNOSIS — N189 Chronic kidney disease, unspecified: Secondary | ICD-10-CM | POA: Insufficient documentation

## 2018-10-07 DIAGNOSIS — Z833 Family history of diabetes mellitus: Secondary | ICD-10-CM | POA: Diagnosis not present

## 2018-10-07 DIAGNOSIS — I129 Hypertensive chronic kidney disease with stage 1 through stage 4 chronic kidney disease, or unspecified chronic kidney disease: Secondary | ICD-10-CM | POA: Diagnosis not present

## 2018-10-07 DIAGNOSIS — Z6837 Body mass index (BMI) 37.0-37.9, adult: Secondary | ICD-10-CM | POA: Diagnosis not present

## 2018-10-07 DIAGNOSIS — Z8249 Family history of ischemic heart disease and other diseases of the circulatory system: Secondary | ICD-10-CM | POA: Diagnosis not present

## 2018-10-07 DIAGNOSIS — E119 Type 2 diabetes mellitus without complications: Secondary | ICD-10-CM | POA: Diagnosis present

## 2018-10-07 DIAGNOSIS — E1122 Type 2 diabetes mellitus with diabetic chronic kidney disease: Secondary | ICD-10-CM | POA: Diagnosis not present

## 2018-10-07 LAB — GLUCOSE, POCT (MANUAL RESULT ENTRY): POC GLUCOSE: 106 mg/dL — AB (ref 70–99)

## 2018-10-07 MED ORDER — LIRAGLUTIDE 18 MG/3ML ~~LOC~~ SOPN: 1.8000 mg | PEN_INJECTOR | Freq: Every day | SUBCUTANEOUS | 6 refills | Status: DC

## 2018-10-07 MED ORDER — INSULIN GLARGINE 100 UNIT/ML SOLOSTAR PEN: 55.0000 [IU] | PEN_INJECTOR | Freq: Two times a day (BID) | SUBCUTANEOUS | 6 refills | Status: DC

## 2018-10-07 NOTE — Progress Notes (Signed)
   S:    PCP: Geryl Rankins  No chief complaint on file.  Patient arrives in good spirits. Presents for diabetes management at the request of Zelda. Patient was referred on 08/08/18 and was last seen by PCP on that day. I last saw her 09/05/18. We started Victoza.   PMH: DM, hypertriglyceridemia, HTN, CKD (1 kidney - eGFR 42 in 11/2017), obesity (BMI 37.42)   Family/Social History:  - Father (deceased): DM, MI  - Mother (deceased): DM - Sister: DM - Tobacco: never smoker - Alcohol: occasionally, specific amount/frequency not given.  Insurance coverage/medication affordability: Pt has Medicare A/B but no prescription drug coverage. Coordinates her patient assistance through the health department.    1. DM:  Patient reports diabetes was diagnosed in 2012. Patient denies hypoglycemic events. Patient reports transforming diet since this summer. Has not been able to exercise because her treadmill broke.   Patient denies polyuria/polydipsia.  Patient denies neuropathy. Patient denies visual changes. Patient reports foot exams.   Patient reports adherence with medications.  Current diabetes medications include:  - Lantus 50 units BID - Victoza 1.8 mg daily  O:  POCT: 106  Fasting home sugars: 119 - 163  Post prandial/random CBG at home: 201 - 238 (2 outliers of 318 and 375)  Last 3 Office BP readings: BP Readings from Last 3 Encounters:  08/08/18 132/84  05/30/18 132/74  05/07/18 (!) 142/74   BMET    Component Value Date/Time   NA 144 08/14/2018 1233   K 4.5 08/14/2018 1233   CL 104 08/14/2018 1233   CO2 24 08/14/2018 1233   GLUCOSE 204 (H) 08/14/2018 1233   GLUCOSE 206 (H) 04/29/2017 1734   BUN 15 08/14/2018 1233   CREATININE 1.38 (H) 08/14/2018 1233   CREATININE 1.21 (H) 11/08/2016 1229   CALCIUM 9.6 08/14/2018 1233   GFRNONAA 39 (L) 08/14/2018 1233   GFRNONAA 46 (L) 11/08/2016 1229   GFRAA 45 (L) 08/14/2018 1233   GFRAA 53 (L) 11/08/2016 1229   Lab Results    Component Value Date   HGBA1C 9.3 (A) 08/08/2018   There were no vitals filed for this visit.  Lipid Panel     Component Value Date/Time   CHOL 170 11/22/2017 1421   TRIG 239 (H) 11/22/2017 1421   HDL 34 (L) 11/22/2017 1421   CHOLHDL 5.0 (H) 11/22/2017 1421   CHOLHDL 6.4 (H) 11/08/2016 1229   VLDL 78 (H) 11/08/2016 1229   LDLCALC 88 11/22/2017 1421    Clinical ASCVD: No  Risk Factors: CKD, albuminuria, TG >175, DM  A/P: 1. DM Diabetes longstanding currently uncontrolled. Patient is adherent with medication. Pt is tolerating Victoza well. Will increase Lantus to achieve better fasting control.   -Increased Lantus (insulin glargine) to 55 units BID. -Continued Victoza 1.8 mg daily -Extensively discussed pathophysiology of DM, recommended lifestyle interventions, dietary effects on glycemic control -Next A1C anticipated 10/2018.   ASCVD risk - primary prevention in patient with DM. She has intolerances to statins. Additionally, pt has significant albuminuria w/ CKD and DM. She needs to re-start lisinopril. .  - Pt refused lisinopril  - Pt refused statins,  Does not with to start Zetia  Written patient instructions provided.  Total time in face to face counseling 30 minutes.   Follow up with PCP.  Dixon Boos, PharmD Candidate [employer] 2021  Benard Halsted, PharmD, [place] [phone]

## 2018-10-07 NOTE — Patient Instructions (Signed)
Thank you for coming to see me today. Please do the following:  1. Increase Lantus to 55 units 2 times daily.  2. Continue Victoza. 3. Continue checking blood sugars at home.  4. Continue making the lifestyle changes we've discussed together during our visit. Diet and exercise play a significant role in improving your blood sugars.  5. Follow-up with PCP in 1 month for diabetes follow-up.    Hypoglycemia or low blood sugar:   Low blood sugar can happen quickly and may become an emergency if not treated right away.   While this shouldn't happen often, it can be brought upon if you skip a meal or do not eat enough. Also, if your insulin or other diabetes medications are dosed too high, this can cause your blood sugar to go to low.   Warning signs of low blood sugar include: 1. Feeling shaky or dizzy 2. Feeling weak or tired  3. Excessive hunger 4. Feeling anxious or upset  5. Sweating even when you aren't exercising  What to do if I experience low blood sugar? 1. Check your blood sugar with your meter. If lower than 70, proceed to step 2.  2. Treat with 3-4 glucose tablets or 3 packets of regular sugar. If these aren't around, you can try hard candy. Yet another option would be to drink 4 ounces of fruit juice or 6 ounces of REGULAR soda.  3. Re-check your sugar in 15 minutes. If it is still below 70, do what you did in step 2 again. If has come back up, go ahead and eat a snack or small meal at this time.

## 2018-10-08 ENCOUNTER — Telehealth: Payer: Self-pay | Admitting: Nurse Practitioner

## 2018-10-08 NOTE — Telephone Encounter (Signed)
Patient called to inform that one of the medications listed on her current medication list is wrong but did not disclose the name of the medication. Please follow up with patient.

## 2018-10-24 NOTE — Telephone Encounter (Signed)
Pt message was escalated to upper management. Pt is aware of changes made.

## 2018-11-13 MED FILL — FENOFIBRATE 145 MG TABLET: 145 | 30 days supply | Qty: 30 | Fill #3

## 2018-12-01 ENCOUNTER — Encounter: Payer: Self-pay | Admitting: Nurse Practitioner

## 2018-12-01 ENCOUNTER — Ambulatory Visit: Payer: Medicare Other | Attending: Nurse Practitioner | Admitting: Nurse Practitioner

## 2018-12-01 VITALS — BP 119/73 | HR 62 | Temp 97.7°F | Resp 16 | Ht 67.0 in | Wt 218.6 lb

## 2018-12-01 DIAGNOSIS — Z1231 Encounter for screening mammogram for malignant neoplasm of breast: Secondary | ICD-10-CM | POA: Diagnosis not present

## 2018-12-01 DIAGNOSIS — E782 Mixed hyperlipidemia: Secondary | ICD-10-CM | POA: Diagnosis not present

## 2018-12-01 DIAGNOSIS — N189 Chronic kidney disease, unspecified: Secondary | ICD-10-CM | POA: Diagnosis not present

## 2018-12-01 DIAGNOSIS — I10 Essential (primary) hypertension: Secondary | ICD-10-CM

## 2018-12-01 DIAGNOSIS — E669 Obesity, unspecified: Secondary | ICD-10-CM

## 2018-12-01 DIAGNOSIS — Z794 Long term (current) use of insulin: Secondary | ICD-10-CM | POA: Diagnosis not present

## 2018-12-01 DIAGNOSIS — R0602 Shortness of breath: Secondary | ICD-10-CM | POA: Diagnosis not present

## 2018-12-01 DIAGNOSIS — E1122 Type 2 diabetes mellitus with diabetic chronic kidney disease: Secondary | ICD-10-CM | POA: Insufficient documentation

## 2018-12-01 DIAGNOSIS — Z9049 Acquired absence of other specified parts of digestive tract: Secondary | ICD-10-CM | POA: Insufficient documentation

## 2018-12-01 DIAGNOSIS — Z833 Family history of diabetes mellitus: Secondary | ICD-10-CM | POA: Diagnosis not present

## 2018-12-01 DIAGNOSIS — Z6834 Body mass index (BMI) 34.0-34.9, adult: Secondary | ICD-10-CM

## 2018-12-01 DIAGNOSIS — I129 Hypertensive chronic kidney disease with stage 1 through stage 4 chronic kidney disease, or unspecified chronic kidney disease: Secondary | ICD-10-CM | POA: Insufficient documentation

## 2018-12-01 DIAGNOSIS — Z85038 Personal history of other malignant neoplasm of large intestine: Secondary | ICD-10-CM | POA: Insufficient documentation

## 2018-12-01 DIAGNOSIS — E781 Pure hyperglyceridemia: Secondary | ICD-10-CM

## 2018-12-01 DIAGNOSIS — Z9884 Bariatric surgery status: Secondary | ICD-10-CM | POA: Insufficient documentation

## 2018-12-01 DIAGNOSIS — E1165 Type 2 diabetes mellitus with hyperglycemia: Secondary | ICD-10-CM | POA: Diagnosis not present

## 2018-12-01 DIAGNOSIS — Z888 Allergy status to other drugs, medicaments and biological substances status: Secondary | ICD-10-CM | POA: Insufficient documentation

## 2018-12-01 DIAGNOSIS — Z79899 Other long term (current) drug therapy: Secondary | ICD-10-CM | POA: Diagnosis not present

## 2018-12-01 LAB — POCT GLYCOSYLATED HEMOGLOBIN (HGB A1C): HBA1C, POC (CONTROLLED DIABETIC RANGE): 7.4 % — AB (ref 0.0–7.0)

## 2018-12-01 LAB — GLUCOSE, POCT (MANUAL RESULT ENTRY): POC Glucose: 70 mg/dl (ref 70–99)

## 2018-12-01 NOTE — Progress Notes (Signed)
Assessment & Plan:  Catherine Fowler was seen today for diabetes.  Diagnoses and all orders for this visit:  Uncontrolled type 2 diabetes mellitus with hyperglycemia ([place]) -     POCT glucose (manual entry) -     POCT glycosylated hemoglobin (Hb A1C) -     CBC -     Ambulatory referral to Ophthalmology Continue blood sugar control as discussed in office today, low carbohydrate diet, and regular physical exercise as tolerated, 150 minutes per week (30 min each day, 5 days per week, or 50 min 3 days per week). Keep blood sugar logs with fasting goal of 90-130 mg/dl, post prandial (after you eat) less than 180.  For Hypoglycemia: BS <60 and Hyperglycemia BS >400; contact the clinic ASAP. Annual eye exams and foot exams are recommended.  Essential hypertension -     CMP14+EGFR -     CBC Continue all antihypertensives as prescribed.  Remember to bring in your blood pressure log with you for your follow up appointment.  DASH/Mediterranean Diets are healthier choices for HTN.    Obesity (BMI 30-39.9) -     Lipid panel Discussed diet and exercise for person with BMI >34. Instructed: You must burn more calories than you eat. Losing 5 percent of your body weight should be considered a success. In the longer term, losing more than 15 percent of your body weight and staying at this weight is an extremely good result. However, keep in mind that even losing 5 percent of your body weight leads to important health benefits, so try not to get discouraged if you're not able to lose more than this. Will recheck weight in 3-6 months.  Breast cancer screening by mammogram -     MM 3D SCREEN BREAST BILATERAL; Future   Hypertriglyceridemia INSTRUCTIONS: Work on a low fat, heart healthy diet and participate in regular aerobic exercise program by working out at least 150 minutes per week; 5 days a week-30 minutes per day. Avoid red meat, fried foods. junk foods, sodas, sugary drinks, unhealthy snacking, alcohol and  smoking.  Drink at least 48oz of water per day and monitor your carbohydrate intake daily.    Patient has been counseled on age-appropriate routine health concerns for screening and prevention. These are reviewed and up-to-date. Referrals have been placed accordingly. Immunizations are up-to-date or declined.    Subjective:   Chief Complaint  Patient presents with  . Diabetes   HPI Catherine Fowler 68 y.o. female presents to office today for follow up to DM, Mixed hyperlipidemia and CKD.  She endorses increased shortness of breath with walking long distances >200 feet. However she states she is able to exercise on her elliptical with minimal shortness of breath.  She states she was evaluated by a cardiologist and states she her work up was normal a few years ago however I am unable to locate any record of this in her chart. She is requesting a handicap placard today. I have instructed her that I will sign for 6 months until she is evaluated by Cardiology for CAD/CHF or any cardiopulmonary disease.  Risk factors: DM, HTN, Obesity, Hypertriglyceridemia.    DM TYPE 2 Chronic and improved. History of noncompliance and managing her DM "her way". Currently on Lantus 55 units BID and Victoza 1.66m daily. Non adherent to taking lisinopril for microalbuminuria. She is exercising twice a day on her elliptical and checking her blood glucose monitoring twice per day. Weight is gradually decreasing. She endorses no side effects  from medications or hypoglycemic/hyperglycemic symptoms.  Lab Results  Component Value Date   HGBA1C 7.4 (A) 12/01/2018    Essential Hypertension Chronic. Well controlled. She is currently not taking any antihypertensives. Denies chest pain, palpitations, lightheadedness, dizziness, headaches or BLE edema.  BP Readings from Last 3 Encounters:  12/01/18 119/73  08/08/18 132/84  05/30/18 132/74   Mixed Hyperlipidemia Has refused statins and zetia.  Currently taking fenofibrate  12m daily as prescribed. She denies any statin intolerance or myalgia history.  Lab Results  Component Value Date   CHOL 170 11/22/2017   CHOL 199 11/08/2016   CHOL 263 (H) 12/06/2014   Lab Results  Component Value Date   HDL 34 (L) 11/22/2017   HDL 31 (L) 11/08/2016   HDL 44 12/06/2014   Lab Results  Component Value Date   LDLCALC 88 11/22/2017   LDLCALC 90 11/08/2016   LDLCALC NOT CALC 12/06/2014   Lab Results  Component Value Date   TRIG 239 (H) 11/22/2017   TRIG 390 (H) 11/08/2016   TRIG 934 (H) 12/06/2014   Lab Results  Component Value Date   CHOLHDL 5.0 (H) 11/22/2017   CHOLHDL 6.4 (H) 11/08/2016   CHOLHDL 6.0 12/06/2014   Review of Systems  Constitutional: Negative for fever, malaise/fatigue and weight loss.  HENT: Negative.  Negative for nosebleeds.   Eyes: Negative.  Negative for blurred vision, double vision and photophobia.  Respiratory: Positive for shortness of breath. Negative for cough.   Cardiovascular: Negative.  Negative for chest pain, palpitations and leg swelling.  Gastrointestinal: Negative.  Negative for heartburn, nausea and vomiting.  Musculoskeletal: Negative.  Negative for myalgias.  Neurological: Negative.  Negative for dizziness, focal weakness, seizures and headaches.  Psychiatric/Behavioral: Negative.  Negative for suicidal ideas.    Past Medical History:  Diagnosis Date  . Chronic kidney disease    lost kidney to a kidney stone.   . [place]  . Diabetes mellitus     Past Surgical History:  Procedure Laterality Date  . ABDOMINAL SURGERY    . APPENDECTOMY    . BREAST SURGERY  2/97   breast reduction   . CHOLECYSTECTOMY    . GASTRIC BYPASS  10/16/2004  . HERNIA REPAIR    . RHINOPLASTY    . TUBAL LIGATION    . VENTRAL HERNIA REPAIR  06/17/2012   Procedure: HERNIA REPAIR VENTRAL ADULT;  Surgeon: HAdin Hector MD;  Location: [facility];  Service: General;  Laterality: N/A;    Family History  Problem Relation  Age of Onset  . Diabetes type II Father   . Diabetes type II Other   . Diabetes type II Sister   . Diabetes type II Maternal Aunt     Social History Reviewed with no changes to be made today.   Outpatient Medications Prior to Visit  Medication Sig Dispense Refill  . fenofibrate (TRICOR) 145 MG tablet Take 1 tablet (145 mg total) by mouth daily. 90 tablet 3  . glucose blood (WAVESENSE PRESTO) test strip Use as instructed 100 each 12  . glucose monitoring kit (FREESTYLE) monitoring kit 1 each by Does not apply route 4 (four) times daily - after meals and at bedtime. 1 month Diabetic Testing Supplies for QAC-QHS accuchecks. Any brand okay 1 each 1  . Insulin Glargine (LANTUS SOLOSTAR) 100 UNIT/ML Solostar Pen Inject 55 Units into the skin 2 (two) times daily. 15 mL 6  . liraglutide (VICTOZA) 18 MG/3ML SOPN Inject 0.3 mLs (1.8 mg  total) into the skin daily. 9 mL 6  . lisinopril (PRINIVIL,ZESTRIL) 10 MG tablet Take 1 tablet (10 mg total) by mouth daily. (Patient not taking: Reported on 08/08/2018) 30 tablet 1  . traZODone (DESYREL) 100 MG tablet Take 1 tablet (100 mg total) by mouth at bedtime. (Patient not taking: Reported on 08/08/2018) 90 tablet 1   No facility-administered medications prior to visit.     Allergies  Allergen Reactions  . Pravastatin Sodium Itching and Rash  . Simvastatin Rash       Objective:    BP 119/73   Pulse 62   Temp 97.7 F (36.5 C) (Oral)   Resp 16   Ht '5\' 7"'  (1.702 m)   Wt 218 lb 9.6 oz (99.2 kg)   SpO2 99%   BMI 34.24 kg/m  Wt Readings from Last 3 Encounters:  12/01/18 218 lb 9.6 oz (99.2 kg)  08/08/18 236 lb (107 kg)  05/07/18 238 lb 14.4 oz (108.4 kg)    Physical Exam Vitals signs and nursing note reviewed.  Constitutional:      Appearance: She is well-developed.  HENT:     Head: Normocephalic and atraumatic.  Neck:     Musculoskeletal: Normal range of motion.  Cardiovascular:     Rate and Rhythm: Normal rate and regular rhythm.      Heart sounds: Normal heart sounds. No murmur. No friction rub. No gallop.   Pulmonary:     Effort: Pulmonary effort is normal. No tachypnea or respiratory distress.     Breath sounds: Normal breath sounds. No decreased breath sounds, wheezing, rhonchi or rales.  Chest:     Chest wall: No tenderness.  Abdominal:     General: Bowel sounds are normal.     Palpations: Abdomen is soft.  Musculoskeletal: Normal range of motion.  Skin:    General: Skin is warm and dry.  Neurological:     Mental Status: She is alert and oriented to person, place, and time.     Coordination: Coordination normal.  Psychiatric:        Behavior: Behavior normal. Behavior is cooperative.        Thought Content: Thought content normal.        Judgment: Judgment normal.          Patient has been counseled extensively about nutrition and exercise as well as the importance of adherence with medications and regular follow-up. The patient was given clear instructions to go to ER or return to medical center if symptoms don't improve, worsen or new problems develop. The patient verbalized understanding.   Follow-up: Return in about 3 months (around 03/02/2019) for DM HTN HPL.   Gildardo Pounds, FNP-BC [facility] [place], [place]   12/01/2018, 8:55 AM

## 2018-12-01 NOTE — Patient Instructions (Signed)

## 2018-12-02 ENCOUNTER — Other Ambulatory Visit: Payer: Self-pay | Admitting: Nurse Practitioner

## 2018-12-02 DIAGNOSIS — I10 Essential (primary) hypertension: Secondary | ICD-10-CM

## 2018-12-02 LAB — LIPID PANEL
Chol/HDL Ratio: 3.8 ratio (ref 0.0–4.4)
Cholesterol, Total: 138 mg/dL (ref 100–199)
HDL: 36 mg/dL — ABNORMAL LOW (ref >39)
LDL Calculated: 80 mg/dL (ref 0–99)
Triglycerides: 111 mg/dL (ref 0–149)
VLDL Cholesterol Cal: 22 mg/dL (ref 5–40)

## 2018-12-02 LAB — CBC
HEMATOCRIT: 42.2 % (ref 34.0–46.6)
Hemoglobin: 14.1 g/dL (ref 11.1–15.9)
MCH: 28.7 pg (ref 26.6–33.0)
MCHC: 33.4 g/dL (ref 31.5–35.7)
MCV: 86 fL (ref 79–97)
Platelets: 275 10*3/uL (ref 150–450)
RBC: 4.91 x10E6/uL (ref 3.77–5.28)
RDW: 15.1 % (ref 11.7–15.4)
WBC: 6.4 10*3/uL (ref 3.4–10.8)

## 2018-12-02 LAB — CMP14+EGFR
ALT: 17 IU/L (ref 0–32)
AST: 24 IU/L (ref 0–40)
Albumin/Globulin Ratio: 1.5 (ref 1.2–2.2)
Albumin: 4 g/dL (ref 3.8–4.8)
Alkaline Phosphatase: 96 IU/L (ref 39–117)
BUN/Creatinine Ratio: 14 (ref 12–28)
BUN: 18 mg/dL (ref 8–27)
Bilirubin Total: 0.3 mg/dL (ref 0.0–1.2)
CO2: 22 mmol/L (ref 20–29)
Calcium: 9 mg/dL (ref 8.7–10.3)
Chloride: 104 mmol/L (ref 96–106)
Creatinine, Ser: 1.3 mg/dL — ABNORMAL HIGH (ref 0.57–1.00)
GFR calc Af Amer: 48 mL/min/{1.73_m2} — ABNORMAL LOW (ref >59)
GFR calc non Af Amer: 42 mL/min/{1.73_m2} — ABNORMAL LOW (ref >59)
Globulin, Total: 2.7 g/dL (ref 1.5–4.5)
Glucose: 62 mg/dL — ABNORMAL LOW (ref 65–99)
POTASSIUM: 4.1 mmol/L (ref 3.5–5.2)
Sodium: 144 mmol/L (ref 134–144)
Total Protein: 6.7 g/dL (ref 6.0–8.5)

## 2018-12-02 LAB — MICROALBUMIN / CREATININE URINE RATIO
Creatinine, Urine: 218.1 mg/dL
Microalb/Creat Ratio: 67 mg/g creat — ABNORMAL HIGH (ref 0–29)
Microalbumin, Urine: 145.1 ug/mL

## 2018-12-02 MED ORDER — LISINOPRIL 2.5 MG PO TABS: 2.5000 mg | ORAL_TABLET | Freq: Every day | ORAL | 1 refills | Status: DC

## 2018-12-15 MED FILL — FENOFIBRATE 145 MG TABLET: 145 | 30 days supply | Qty: 30 | Fill #4

## 2018-12-16 ENCOUNTER — Telehealth: Payer: Self-pay | Admitting: Pharmacist

## 2018-12-16 DIAGNOSIS — E162 Hypoglycemia, unspecified: Secondary | ICD-10-CM | POA: Diagnosis not present

## 2018-12-16 DIAGNOSIS — R404 Transient alteration of awareness: Secondary | ICD-10-CM | POA: Diagnosis not present

## 2018-12-16 DIAGNOSIS — R410 Disorientation, unspecified: Secondary | ICD-10-CM | POA: Diagnosis not present

## 2018-12-16 DIAGNOSIS — E161 Other hypoglycemia: Secondary | ICD-10-CM | POA: Diagnosis not present

## 2018-12-16 NOTE — Telephone Encounter (Signed)
Call received. Pt reports hypoglycemic event this morning with BG of 43. She reports that her housekeeper found her "out of it" and called EMS. On arrival, EMS successfully treated her hypoglycemia but pt declined going to the hospital. Reports that her sugar came up to 120s. Last checked about ~1 hour ago and BG was 163.   I have instructed her to hold insulin for today and restart tomorrow at a reduced dose of 50 units BID. She is to resume Victoza. She was instructed to monitor BG closely and report any objective readings < 70. She now is aware of the s/sx of hypoglycemia and is able to verbalize appropriate treatment plan.   Will forward information to pt's PCP. I have instructed Ms. Gelles to call me with any continued hypoglycemia so I may adjust her insulin dose further.   Benard Halsted, PharmD, [place] [phone]

## 2018-12-17 ENCOUNTER — Telehealth: Payer: Self-pay | Admitting: *Deleted

## 2018-12-17 NOTE — Telephone Encounter (Signed)
Message left on Set designer. Returned patient phone call.  Patient had a hypoglycemic event on yesterday. States here caregiver found her unresponsive on yesterday when she came on shift. Her caregiver then called EMS and multiple calls were placed to [facility] to speak to [facility]. She has since been able speak to [facility]. Pt was very upset and stated this has been an ongoing problem and now that her life was at stake she has realized that she will be changing her PCP.

## 2018-12-18 NOTE — Telephone Encounter (Signed)
NOTED and agree with recommendations

## 2018-12-23 ENCOUNTER — Other Ambulatory Visit: Payer: Self-pay | Admitting: Nurse Practitioner

## 2019-01-16 MED FILL — FENOFIBRATE 145 MG TABLET: 145 | 30 days supply | Qty: 30 | Fill #5

## 2019-01-23 ENCOUNTER — Telehealth: Payer: Self-pay | Admitting: Family Medicine

## 2019-01-23 NOTE — Telephone Encounter (Signed)
I gave her a call to address her complaints and see how we could improve her experience here in the Clinic. She informed me we had not improved it in 4 years why would we now. She said her problem was with the personnel and she would be making her next appointment with another clinic and thanked me for giving her a call.

## 2019-01-29 ENCOUNTER — Other Ambulatory Visit: Payer: Self-pay | Admitting: Family Medicine

## 2019-02-18 ENCOUNTER — Other Ambulatory Visit: Payer: Self-pay

## 2019-02-18 ENCOUNTER — Encounter (HOSPITAL_COMMUNITY): Payer: Self-pay | Admitting: Obstetrics and Gynecology

## 2019-02-18 ENCOUNTER — Emergency Department (HOSPITAL_COMMUNITY)
Admission: EM | Admit: 2019-02-18 | Discharge: 2019-02-18 | Disposition: A | Discharge status: Home / Self Care | Payer: Medicare Other | Attending: Emergency Medicine | Admitting: Emergency Medicine

## 2019-02-18 ENCOUNTER — Emergency Department (HOSPITAL_COMMUNITY): Payer: Medicare Other

## 2019-02-18 DIAGNOSIS — E1122 Type 2 diabetes mellitus with diabetic chronic kidney disease: Secondary | ICD-10-CM | POA: Diagnosis not present

## 2019-02-18 DIAGNOSIS — N3001 Acute cystitis with hematuria: Secondary | ICD-10-CM

## 2019-02-18 DIAGNOSIS — Z794 Long term (current) use of insulin: Secondary | ICD-10-CM | POA: Diagnosis not present

## 2019-02-18 DIAGNOSIS — N189 Chronic kidney disease, unspecified: Secondary | ICD-10-CM | POA: Diagnosis not present

## 2019-02-18 DIAGNOSIS — Z9884 Bariatric surgery status: Secondary | ICD-10-CM | POA: Insufficient documentation

## 2019-02-18 DIAGNOSIS — F329 Major depressive disorder, single episode, unspecified: Secondary | ICD-10-CM | POA: Insufficient documentation

## 2019-02-18 DIAGNOSIS — Z79899 Other long term (current) drug therapy: Secondary | ICD-10-CM | POA: Insufficient documentation

## 2019-02-18 DIAGNOSIS — Z9049 Acquired absence of other specified parts of digestive tract: Secondary | ICD-10-CM | POA: Insufficient documentation

## 2019-02-18 DIAGNOSIS — Z85038 Personal history of other malignant neoplasm of large intestine: Secondary | ICD-10-CM | POA: Insufficient documentation

## 2019-02-18 DIAGNOSIS — R109 Unspecified abdominal pain: Secondary | ICD-10-CM

## 2019-02-18 DIAGNOSIS — I129 Hypertensive chronic kidney disease with stage 1 through stage 4 chronic kidney disease, or unspecified chronic kidney disease: Secondary | ICD-10-CM | POA: Diagnosis not present

## 2019-02-18 DIAGNOSIS — R1032 Left lower quadrant pain: Secondary | ICD-10-CM | POA: Diagnosis not present

## 2019-02-18 DIAGNOSIS — R1030 Lower abdominal pain, unspecified: Secondary | ICD-10-CM | POA: Diagnosis not present

## 2019-02-18 LAB — COMPREHENSIVE METABOLIC PANEL
ALT: 15 U/L (ref 0–44)
AST: 21 U/L (ref 15–41)
Albumin: 4.1 g/dL (ref 3.5–5.0)
Alkaline Phosphatase: 98 U/L (ref 38–126)
Anion gap: 8 (ref 5–15)
BUN: 22 mg/dL (ref 8–23)
CO2: 26 mmol/L (ref 22–32)
Calcium: 9.1 mg/dL (ref 8.9–10.3)
Chloride: 106 mmol/L (ref 98–111)
Creatinine, Ser: 1.36 mg/dL — ABNORMAL HIGH (ref 0.44–1.00)
GFR calc Af Amer: 46 mL/min — ABNORMAL LOW (ref >60)
GFR calc non Af Amer: 40 mL/min — ABNORMAL LOW (ref >60)
Glucose, Bld: 93 mg/dL (ref 70–99)
Potassium: 3.9 mmol/L (ref 3.5–5.1)
Sodium: 140 mmol/L (ref 135–145)
Total Bilirubin: 0.5 mg/dL (ref 0.3–1.2)
Total Protein: 7.6 g/dL (ref 6.5–8.1)

## 2019-02-18 LAB — URINALYSIS, ROUTINE W REFLEX MICROSCOPIC
Bilirubin Urine: NEGATIVE
Glucose, UA: NEGATIVE mg/dL
Ketones, ur: NEGATIVE mg/dL
Nitrite: NEGATIVE
Protein, ur: 100 mg/dL — AB
RBC / HPF: >50 RBC/hpf — ABNORMAL HIGH (ref 0–5)
Specific Gravity, Urine: 1.024 (ref 1.005–1.030)
WBC, UA: >50 WBC/hpf — ABNORMAL HIGH (ref 0–5)
pH: 5 (ref 5.0–8.0)

## 2019-02-18 LAB — CBC
HCT: 44 % (ref 36.0–46.0)
Hemoglobin: 14.5 g/dL (ref 12.0–15.0)
MCH: 30 pg (ref 26.0–34.0)
MCHC: 33 g/dL (ref 30.0–36.0)
MCV: 90.9 fL (ref 80.0–100.0)
Platelets: 246 10*3/uL (ref 150–400)
RBC: 4.84 MIL/uL (ref 3.87–5.11)
RDW: 13.6 % (ref 11.5–15.5)
WBC: 6.8 10*3/uL (ref 4.0–10.5)
nRBC: 0 % (ref 0.0–0.2)

## 2019-02-18 LAB — LIPASE, BLOOD: Lipase: 43 U/L (ref 11–51)

## 2019-02-18 MED ORDER — CEPHALEXIN 500 MG PO CAPS: 500.0000 mg | ORAL_CAPSULE | Freq: Three times a day (TID) | ORAL | 0 refills | Status: DC

## 2019-02-18 MED ORDER — SODIUM CHLORIDE 0.9 % IV BOLUS
1000.0000 mL | Freq: Once | INTRAVENOUS | Status: AC
  Administered 2019-02-18: 08:00:00 1000 mL via INTRAVENOUS

## 2019-02-18 MED ORDER — MORPHINE SULFATE (PF) 4 MG/ML IV SOLN
6.0000 mg | Freq: Once | INTRAVENOUS | Status: AC
  Administered 2019-02-18: 6 mg via INTRAVENOUS
  Filled 2019-02-18: qty 2

## 2019-02-18 MED ORDER — SODIUM CHLORIDE 0.9 % IV SOLN
1.0000 g | Freq: Once | INTRAVENOUS | Status: AC
  Administered 2019-02-18: 1 g via INTRAVENOUS
  Filled 2019-02-18: qty 10

## 2019-02-18 MED ORDER — HYDROCODONE-ACETAMINOPHEN 5-325 MG PO TABS: 1.0000 | ORAL_TABLET | ORAL | 0 refills | Status: DC | PRN

## 2019-02-18 MED ORDER — HYDROCODONE-ACETAMINOPHEN 5-325 MG PO TABS: 1.0000 | ORAL_TABLET | ORAL | 0 refills | Status: AC | PRN

## 2019-02-18 MED ORDER — IOHEXOL 300 MG/ML  SOLN
30.0000 mL | Freq: Once | INTRAMUSCULAR | Status: AC | PRN
  Administered 2019-02-18: 30 mL via ORAL

## 2019-02-18 MED FILL — CEPHALEXIN 500 MG CAPSULE: 500 | 7 days supply | Qty: 21 | Fill #0

## 2019-02-18 NOTE — ED Triage Notes (Signed)
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 Pt reports she also has a hx of hernia and has been using her eliptical and is worried that has aggravated her back and possibly the hernia.  Pt reports she has been having the abdominal and back pain x1 week but did not want to come in due to covid.  Pt reports she never had chemo, but had surgery.  Pt reports she only has 1 kidney and has been taking tylenol for the pain but it is no longer helping

## 2019-02-18 NOTE — ED Provider Notes (Signed)
[facility] DEPT Provider Note   CSN: [id number] Arrival date & time: 02/18/19  0160    History   Chief Complaint Chief Complaint  Patient presents with   Abdominal Pain   Back Pain    HPI Catherine Fowler is a 68 y.o. female.     HPI Patient is a 68 year old female presents the emergency department complaints of worsening lower abdominal pain and discomfort over the past 72 hours.  She has had mild pain for 1 week but seems to be worsening.  She reports a history of prior sigmoid surgery secondary to cancer without radiation or chemotherapy.  She denies urinary frequency or dysuria.  No fevers or chills.  She denies nausea vomiting.  She reports the lower abdominal pain is constant.   Past Medical History:  Diagnosis Date   Chronic kidney disease    lost kidney to a kidney stone.    Colon cancer ([place]) 09/18/13   Diabetes mellitus     Patient Active Problem List   Diagnosis Date Noted   Breast cancer screening 11/08/2016   Depression (emotion) 07/21/2015   Diabetes with skin complication ([place]) 10/93/2355   Type 2 diabetes mellitus with hyperglycemia ([place]) 04/21/2015   Other specified diabetes mellitus without complications ([place]) 73/22/0254   Type 2 diabetes mellitus without complication ([place]) 27/04/2375   History of colon cancer, no staging 09/09/2014   Mixed hyperlipidemia 06/07/2014   Essential hypertension 06/07/2014   Anemia, iron deficiency 07/29/2013   DM (diabetes mellitus), type 2, uncontrolled ([place]) 09/24/2011    Past Surgical History:  Procedure Laterality Date   ABDOMINAL SURGERY     APPENDECTOMY     BREAST SURGERY  2/97   breast reduction    CHOLECYSTECTOMY     GASTRIC BYPASS  10/16/2004   HERNIA REPAIR     RHINOPLASTY     TUBAL LIGATION     VENTRAL HERNIA REPAIR  06/17/2012   Procedure: HERNIA REPAIR VENTRAL ADULT;  Surgeon: Adin Hector, MD;  Location: [facility];  Service: General;  Laterality:  N/A;     OB History    Gravida      Para      Term      Preterm      AB      Living  1     SAB      TAB      Ectopic      Multiple      Live Births           Obstetric Comments  SVD x 1.          Home Medications    Prior to Admission medications   Medication Sig Start Date End Date Taking? Authorizing Provider  fenofibrate (TRICOR) 145 MG tablet Take 1 tablet (145 mg total) by mouth daily. 08/08/18  Yes Gildardo Pounds, NP  glucose blood (WAVESENSE PRESTO) test strip Use as instructed 10/02/16  Yes Jegede, Olugbemiga E, MD  glucose monitoring kit (FREESTYLE) monitoring kit 1 each by Does not apply route 4 (four) times daily - after meals and at bedtime. 1 month Diabetic Testing Supplies for QAC-QHS accuchecks. Any brand okay 08/14/13  Yes Thurnell Lose, MD  LANTUS SOLOSTAR 100 UNIT/ML Solostar Pen INJECT 55 UNITS INTO THE SKIN 2 TIMES A DAY. Patient taking differently: 50 Units 2 (two) times daily.  01/30/19  Yes Gildardo Pounds, NP  liraglutide (VICTOZA) 18 MG/3ML SOPN Inject 0.3 mLs (1.8 mg total)  into the skin daily. 10/07/18  Yes Charlott Rakes, MD  cephALEXin (KEFLEX) 500 MG capsule Take 1 capsule (500 mg total) by mouth 3 (three) times daily. 02/18/19   Jola Schmidt, MD  HYDROcodone-acetaminophen (NORCO/VICODIN) 5-325 MG tablet Take 1 tablet by mouth every 4 (four) hours as needed for moderate pain. 02/18/19   Jola Schmidt, MD  lisinopril (PRINIVIL,ZESTRIL) 2.5 MG tablet Take 1 tablet (2.5 mg total) by mouth daily. Patient not taking: Reported on 02/18/2019 12/02/18 03/02/19  Gildardo Pounds, NP  traZODone (DESYREL) 100 MG tablet Take 1 tablet (100 mg total) by mouth at bedtime. Patient not taking: Reported on 08/08/2018 05/07/18   Gildardo Pounds, NP    Family History Family History  Problem Relation Age of Onset   Diabetes type II Father    Diabetes type II Other    Diabetes type II Sister    Diabetes type II Maternal Aunt     Social  History Social History   Tobacco Use   Smoking status: Never Smoker   Smokeless tobacco: Never Used  Substance Use Topics   Alcohol use: No   Drug use: No     Allergies   Pravastatin sodium and Simvastatin   Review of Systems Review of Systems  All other systems reviewed and are negative.    Physical Exam Updated Vital Signs BP 140/68 (BP Location: Right Arm)    Pulse 61    Temp 97.6 F (36.4 C) (Oral)    Resp 18    Ht '5\' 7"'  (1.702 m)    Wt 98.4 kg    SpO2 98%    BMI 33.99 kg/m   Physical Exam Vitals signs and nursing note reviewed.  Constitutional:      General: She is not in acute distress.    Appearance: She is well-developed.  HENT:     Head: Normocephalic and atraumatic.  Neck:     Musculoskeletal: Normal range of motion.  Cardiovascular:     Rate and Rhythm: Normal rate and regular rhythm.     Heart sounds: Normal heart sounds.  Pulmonary:     Effort: Pulmonary effort is normal.     Breath sounds: Normal breath sounds.  Abdominal:     General: There is no distension.     Palpations: Abdomen is soft.     Tenderness: There is abdominal tenderness in the suprapubic area.  Musculoskeletal: Normal range of motion.  Skin:    General: Skin is warm and dry.  Neurological:     Mental Status: She is alert and oriented to person, place, and time.  Psychiatric:        Judgment: Judgment normal.      ED Treatments / Results  Labs (all labs ordered are listed, but only abnormal results are displayed) Labs Reviewed  COMPREHENSIVE METABOLIC PANEL - Abnormal; Notable for the following components:      Result Value   Creatinine, Ser 1.36 (*)    GFR calc non Af Amer 40 (*)    GFR calc Af Amer 46 (*)    All other components within normal limits  URINALYSIS, ROUTINE W REFLEX MICROSCOPIC - Abnormal; Notable for the following components:   APPearance TURBID (*)    Hgb urine dipstick MODERATE (*)    Protein, ur 100 (*)    Leukocytes,Ua LARGE (*)    RBC / HPF  >50 (*)    WBC, UA >50 (*)    Bacteria, UA MANY (*)    All other components  within normal limits  URINE CULTURE  CBC  LIPASE, BLOOD    EKG None  Radiology Ct Abdomen Pelvis Wo Contrast  Result Date: 02/18/2019 CLINICAL DATA:  Left lower quadrant pain EXAM: CT ABDOMEN AND PELVIS WITHOUT CONTRAST TECHNIQUE: Multidetector CT imaging of the abdomen and pelvis was performed following the standard protocol without IV contrast. COMPARISON:  04/26/2017 FINDINGS: Lower chest: No acute abnormality Hepatobiliary: Prior cholecystectomy. Diffuse fatty infiltration of the liver. No focal hepatic abnormality. Pancreas: No focal abnormality or ductal dilatation. Spleen: Calcification along the surface of the spleen is stable. Normal size. 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 Near complete renal parenchymal loss compatible with chronic process. No hydronephrosis on the right. Urinary bladder and adrenal glands unremarkable. Stomach/Bowel: Stomach, large and small bowel grossly unremarkable. Post gastric bypass changes. No evidence of bowel obstruction. Vascular/Lymphatic: Aortic atherosclerosis. No enlarged abdominal or pelvic lymph nodes. Reproductive: Uterus and adnexa unremarkable.  No mass. Other: No free fluid or free air. Musculoskeletal: No acute bony abnormality. Degenerative disc and facet disease in the lumbar spine. IMPRESSION: Stable appearance UPJ stone with chronic severe left of the 17 mm left hydronephrosis and near complete overlying renal parenchymal loss. This is unchanged since prior study. No evidence of diverticulosis or diverticulitis. Prior gastric bypass.  No complicating feature. Aortic atherosclerosis. No acute findings. Electronically Signed   By: Rolm Baptise M.D.   On: 02/18/2019 11:07    Procedures Procedures (including critical care time)  Medications Ordered in ED Medications  morphine 4 MG/ML injection 6 mg (6  mg Intravenous Given 02/18/19 0816)  sodium chloride 0.9 % bolus 1,000 mL (0 mLs Intravenous Stopped 02/18/19 0949)  iohexol (OMNIPAQUE) 300 MG/ML solution 30 mL (30 mLs Oral Contrast Given 02/18/19 0847)  cefTRIAXone (ROCEPHIN) 1 g in sodium chloride 0.9 % 100 mL IVPB (0 g Intravenous Stopped 02/18/19 1300)     Initial Impression / Assessment and Plan / ED Course  I have reviewed the triage vital signs and the nursing notes.  Pertinent labs & imaging results that were available during my care of the patient were reviewed by me and considered in my medical decision making (see chart for details).        Patient with evidence of acute cystitis.  CT of her abdomen pelvis otherwise without acute intra-abdominal pathology.  Rocephin given in the emergency department.  Urine culture sent.  Home with a short course of pain medication and Keflex.  Patient understands return to the ER for new or worsening symptoms  Final Clinical Impressions(s) / ED Diagnoses   Final diagnoses:  Acute abdominal pain  Acute cystitis with hematuria    ED Discharge Orders         Ordered    cephALEXin (KEFLEX) 500 MG capsule  3 times daily,   Status:  Discontinued     02/18/19 1301    HYDROcodone-acetaminophen (NORCO/VICODIN) 5-325 MG tablet  Every 4 hours PRN,   Status:  Discontinued     02/18/19 1301    HYDROcodone-acetaminophen (NORCO/VICODIN) 5-325 MG tablet  Every 4 hours PRN     02/18/19 1314    cephALEXin (KEFLEX) 500 MG capsule  3 times daily     02/18/19 1316           Jola Schmidt, MD 02/18/19 1319

## 2019-02-18 NOTE — ED Notes (Signed)
Patient transported to CT 

## 2019-02-18 NOTE — ED Notes (Signed)
Urine Culture has been sent to lab.

## 2019-02-18 NOTE — ED Notes (Signed)
Patient has been discharged and is dressing and calling for ride.

## 2019-02-20 LAB — URINE CULTURE

## 2019-02-23 MED FILL — FENOFIBRATE 145 MG TABLET: 145 | 30 days supply | Qty: 30 | Fill #6

## 2019-02-28 ENCOUNTER — Emergency Department (HOSPITAL_COMMUNITY)
Admission: EM | Admit: 2019-02-28 | Discharge: 2019-02-28 | Disposition: A | Discharge status: Home / Self Care | Payer: Medicare Other | Attending: Emergency Medicine | Admitting: Emergency Medicine

## 2019-02-28 ENCOUNTER — Other Ambulatory Visit: Payer: Self-pay

## 2019-02-28 ENCOUNTER — Encounter (HOSPITAL_COMMUNITY): Payer: Self-pay | Admitting: Emergency Medicine

## 2019-02-28 DIAGNOSIS — Z905 Acquired absence of kidney: Secondary | ICD-10-CM | POA: Insufficient documentation

## 2019-02-28 DIAGNOSIS — N189 Chronic kidney disease, unspecified: Secondary | ICD-10-CM | POA: Diagnosis not present

## 2019-02-28 DIAGNOSIS — I129 Hypertensive chronic kidney disease with stage 1 through stage 4 chronic kidney disease, or unspecified chronic kidney disease: Secondary | ICD-10-CM | POA: Diagnosis not present

## 2019-02-28 DIAGNOSIS — E1122 Type 2 diabetes mellitus with diabetic chronic kidney disease: Secondary | ICD-10-CM | POA: Insufficient documentation

## 2019-02-28 DIAGNOSIS — Z794 Long term (current) use of insulin: Secondary | ICD-10-CM | POA: Diagnosis not present

## 2019-02-28 DIAGNOSIS — N3001 Acute cystitis with hematuria: Secondary | ICD-10-CM | POA: Insufficient documentation

## 2019-02-28 DIAGNOSIS — Z85038 Personal history of other malignant neoplasm of large intestine: Secondary | ICD-10-CM | POA: Insufficient documentation

## 2019-02-28 DIAGNOSIS — Z79899 Other long term (current) drug therapy: Secondary | ICD-10-CM | POA: Diagnosis not present

## 2019-02-28 DIAGNOSIS — R109 Unspecified abdominal pain: Secondary | ICD-10-CM | POA: Diagnosis present

## 2019-02-28 LAB — URINALYSIS, ROUTINE W REFLEX MICROSCOPIC
Bilirubin Urine: NEGATIVE
Glucose, UA: NEGATIVE mg/dL
Ketones, ur: NEGATIVE mg/dL
Nitrite: NEGATIVE
Protein, ur: 100 mg/dL — AB
Specific Gravity, Urine: 1.016 (ref 1.005–1.030)
WBC, UA: >50 WBC/hpf — ABNORMAL HIGH (ref 0–5)
pH: 6 (ref 5.0–8.0)

## 2019-02-28 MED ORDER — CEFTRIAXONE SODIUM 1 G IJ SOLR
1.0000 g | Freq: Once | INTRAMUSCULAR | Status: AC
  Administered 2019-02-28: 1 g via INTRAMUSCULAR
  Filled 2019-02-28: qty 10

## 2019-02-28 MED ORDER — PHENAZOPYRIDINE HCL 200 MG PO TABS
200.0000 mg | ORAL_TABLET | Freq: Once | ORAL | Status: AC
  Administered 2019-02-28: 200 mg via ORAL
  Filled 2019-02-28: qty 1

## 2019-02-28 MED ORDER — CEPHALEXIN 500 MG PO CAPS: 500.0000 mg | ORAL_CAPSULE | Freq: Two times a day (BID) | ORAL | 0 refills | Status: AC

## 2019-02-28 MED ORDER — CEPHALEXIN 500 MG PO CAPS: 500.0000 mg | ORAL_CAPSULE | Freq: Two times a day (BID) | ORAL | 0 refills | Status: DC

## 2019-02-28 MED ORDER — STERILE WATER FOR INJECTION IJ SOLN
INTRAMUSCULAR | Status: AC
  Administered 2019-02-28: 10 mL
  Filled 2019-02-28: qty 10

## 2019-02-28 NOTE — ED Triage Notes (Signed)
Patient here from home with complaints of UTI. States that she was seen here 1 week ago on the 8th and was diagnosed with "severe UTI". States that she needs to get another "round of antibiotics". States that she only has "1 kidney" and cannot keep taking pain meds and tylenol that does not work.

## 2019-02-28 NOTE — Discharge Instructions (Addendum)
I have prescribed Keflex to treat your urinary tract infection, please take 1 pill twice a day for the next 10 days.  Please schedule an appointment with urologist to further manage your urinary tract infection.

## 2019-02-28 NOTE — ED Provider Notes (Signed)
[facility] DEPT Provider Note   CSN: [id number] Arrival date & time: 02/28/19  1550    History   Chief Complaint Chief Complaint  Patient presents with   Cystitis    HPI LYFE REIHL is a 68 y.o. female.     68 y/o female with a  PMH of CKD, DM, HTN presents to the ED with a chief complaint of left flank pain x couple of days. Patient was seen 10 days ago in the ED she had a CT abdomen without contrast, this showed a 17 mm stone on the left kidney.  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  Patient was given a prescription for Keflex which she completed the treatment a couple days ago, she reports she was pain-free for 1 day and then her symptoms returned.  She was giving a prescription for acetaminophen, oxycodone but reports she cannot continue taking this medication as she only has 1 kidney.  Patient reports she has had a constant left flank pain which radiates to the front around her bladder region.  She reports not having any urinary symptoms at this time such as dysuria, hematuria, frequency.  She denies any fever, abdominal pain at this time.  Patient is requesting a refill on the antibiotics.     Past Medical History:  Diagnosis Date   Chronic kidney disease    lost kidney to a kidney stone.    Colon cancer ([place]) 09/18/13   Diabetes mellitus     Patient Active Problem List   Diagnosis Date Noted   Breast cancer screening 11/08/2016   Depression (emotion) 07/21/2015   Diabetes with skin complication ([place]) 52/77/8242   Type 2 diabetes mellitus with hyperglycemia ([place]) 04/21/2015   Other specified diabetes mellitus without complications ([place]) 35/36/1443   Type 2 diabetes mellitus without complication ([place]) 15/40/0867   History of colon cancer, no staging 09/09/2014   Mixed hyperlipidemia 06/07/2014   Essential hypertension 06/07/2014   Anemia, iron  deficiency 07/29/2013   DM (diabetes mellitus), type 2, uncontrolled ([place]) 09/24/2011    Past Surgical History:  Procedure Laterality Date   ABDOMINAL SURGERY     APPENDECTOMY     BREAST SURGERY  2/97   breast reduction    CHOLECYSTECTOMY     GASTRIC BYPASS  10/16/2004   HERNIA REPAIR     RHINOPLASTY     TUBAL LIGATION     VENTRAL HERNIA REPAIR  06/17/2012   Procedure: HERNIA REPAIR VENTRAL ADULT;  Surgeon: Adin Hector, MD;  Location: [facility];  Service: General;  Laterality: N/A;     OB History    Gravida      Para      Term      Preterm      AB      Living  1     SAB      TAB      Ectopic      Multiple      Live Births           Obstetric Comments  SVD x 1.          Home Medications    Prior to Admission medications   Medication Sig Start Date End Date Taking? Authorizing Provider  cephALEXin (KEFLEX) 500 MG capsule Take 1 capsule (500 mg total) by mouth 2 (two) times daily for 10 days. 02/28/19 03/10/19  Catrice Zuleta,  Cheyanna Strick, PA-C  fenofibrate (TRICOR) 145 MG tablet Take 1 tablet (145 mg total) by mouth daily. 08/08/18   Gildardo Pounds, NP  glucose blood (WAVESENSE PRESTO) test strip Use as instructed 10/02/16   Tresa Garter, MD  glucose monitoring kit (FREESTYLE) monitoring kit 1 each by Does not apply route 4 (four) times daily - after meals and at bedtime. 1 month Diabetic Testing Supplies for QAC-QHS accuchecks. Any brand okay 08/14/13   Thurnell Lose, MD  HYDROcodone-acetaminophen (NORCO/VICODIN) 5-325 MG tablet Take 1 tablet by mouth every 4 (four) hours as needed for moderate pain. 02/18/19   Jola Schmidt, MD  LANTUS SOLOSTAR 100 UNIT/ML Solostar Pen INJECT 55 UNITS INTO THE SKIN 2 TIMES A DAY. Patient taking differently: 50 Units 2 (two) times daily.  01/30/19   Gildardo Pounds, NP  liraglutide (VICTOZA) 18 MG/3ML SOPN Inject 0.3 mLs (1.8 mg total) into the skin daily. 10/07/18   Charlott Rakes, MD  lisinopril (PRINIVIL,ZESTRIL)  2.5 MG tablet Take 1 tablet (2.5 mg total) by mouth daily. Patient not taking: Reported on 02/18/2019 12/02/18 03/02/19  Gildardo Pounds, NP  traZODone (DESYREL) 100 MG tablet Take 1 tablet (100 mg total) by mouth at bedtime. Patient not taking: Reported on 08/08/2018 05/07/18   Gildardo Pounds, NP    Family History Family History  Problem Relation Age of Onset   Diabetes type II Father    Diabetes type II Other    Diabetes type II Sister    Diabetes type II Maternal Aunt     Social History Social History   Tobacco Use   Smoking status: Never Smoker   Smokeless tobacco: Never Used  Substance Use Topics   Alcohol use: No   Drug use: No     Allergies   Pravastatin sodium and Simvastatin   Review of Systems Review of Systems  Constitutional: Negative for chills and fever.  HENT: Negative for ear pain and sore throat.   Eyes: Negative for pain and visual disturbance.  Respiratory: Negative for cough and shortness of breath.   Cardiovascular: Negative for chest pain and palpitations.  Gastrointestinal: Negative for abdominal pain and vomiting.  Genitourinary: Positive for flank pain. Negative for dysuria and hematuria.  Musculoskeletal: Negative for arthralgias and back pain.  Skin: Negative for color change and rash.  Neurological: Negative for seizures and syncope.  All other systems reviewed and are negative.    Physical Exam Updated Vital Signs BP 127/85 (BP Location: Left Arm)    Pulse 71    Temp 97.8 F (36.6 C) (Oral)    Resp 18    SpO2 99%   Physical Exam Vitals signs and nursing note reviewed.  Constitutional:      General: She is not in acute distress.    Appearance: She is well-developed.  HENT:     Head: Normocephalic and atraumatic.     Mouth/Throat:     Pharynx: No oropharyngeal exudate.  Eyes:     Pupils: Pupils are equal, round, and reactive to light.  Neck:     Musculoskeletal: Normal range of motion.  Cardiovascular:     Rate and  Rhythm: Regular rhythm.     Heart sounds: Normal heart sounds.  Pulmonary:     Effort: Pulmonary effort is normal. No respiratory distress.     Breath sounds: Normal breath sounds.  Abdominal:     General: Abdomen is protuberant. Bowel sounds are normal. There is no distension.     Palpations: Abdomen  is soft.     Tenderness: There is no abdominal tenderness. There is left CVA tenderness. There is no right CVA tenderness or guarding. Negative signs include Murphy's sign and McBurney's sign.     Comments: Mild left CVA tenderness.   Musculoskeletal:        General: No tenderness or deformity.     Right lower leg: No edema.     Left lower leg: No edema.  Skin:    General: Skin is warm and dry.  Neurological:     Mental Status: She is alert and oriented to person, place, and time.      ED Treatments / Results  Labs (all labs ordered are listed, but only abnormal results are displayed) Labs Reviewed  URINALYSIS, ROUTINE W REFLEX MICROSCOPIC - Abnormal; Notable for the following components:      Result Value   APPearance TURBID (*)    Hgb urine dipstick MODERATE (*)    Protein, ur 100 (*)    Leukocytes,Ua LARGE (*)    WBC, UA >50 (*)    Bacteria, UA FEW (*)    Non Squamous Epithelial 0-5 (*)    All other components within normal limits  URINE CULTURE    EKG None  Radiology No results found.  Procedures Procedures (including critical care time)  Medications Ordered in ED Medications  cefTRIAXone (ROCEPHIN) injection 1 g (has no administration in time range)  phenazopyridine (PYRIDIUM) tablet 200 mg (has no administration in time range)     Initial Impression / Assessment and Plan / ED Course  I have reviewed the triage vital signs and the nursing notes.  Pertinent labs & imaging results that were available during my care of the patient were reviewed by me and considered in my medical decision making (see chart for details).     Patient seen in the ED with a  previous complaint of UTI, treated with Keflex 7 day therapy.  Was sent home with Norco for her pain, reports she is been taking the pain medication and completed antibiotics but does not feel any better.  Patient also reports she only has 1 working kidney.  She had a CT scan abdomen without contrast done last time which showed a 17 mm stone to her left kidney, this is a chronic issue.  Patient complains of a constant left flank pain radiating to the front, states it improved some after antibiotics but now has worsened.  I have extensively rechecked her chart, her urine culture which was sent last time showed too many species, unsure whether this was resistant to Keflex.  A new UA was collected and sent for culture as well.  UA showed appearance was turbid, hemoglobin is moderate, protein 100, leukocytes large along with greater 50,000 white blood cell counts.  I have discussed this patient with Dr. Kathrynn Humble, he has also seen and evaluated patient.  Our plan consistent providing patient with Rocephin in the ED along with Pyridium, she will also be going home with a prescription for Keflex for 10 days.  I have advised patient that if her culture returns were resistant to Keflex she will be called to have this medication change.  Patient also reports she has a urologist which she will be setting an appointment with to follow-up.  Patient understands and agrees with plan at this time.  Return precautions provided at length for patient.  Stable for discharge, afebrile.   Portions of this note were generated with [employer]. Dictation errors may  occur despite best attempts at proofreading.     Final Clinical Impressions(s) / ED Diagnoses   Final diagnoses:  Acute cystitis with hematuria    ED Discharge Orders         Ordered    cephALEXin (KEFLEX) 500 MG capsule  2 times daily     02/28/19 1832           Janeece Fitting, PA-C 02/28/19 [place], Ankit, MD 02/28/19 2040

## 2019-03-02 LAB — URINE CULTURE

## 2019-03-02 MED FILL — CEPHALEXIN 500 MG CAPSULE: 500 | 10 days supply | Qty: 20 | Fill #0

## 2019-03-28 MED FILL — ?FENOFIBRATE 145 MG TABLET: 145 | 30 days supply | Qty: 30 | Fill #6

## 2019-03-31 DIAGNOSIS — N200 Calculus of kidney: Secondary | ICD-10-CM | POA: Diagnosis not present

## 2019-03-31 DIAGNOSIS — N261 Atrophy of kidney (terminal): Secondary | ICD-10-CM | POA: Diagnosis not present

## 2019-03-31 DIAGNOSIS — R8271 Bacteriuria: Secondary | ICD-10-CM | POA: Diagnosis not present

## 2019-03-31 DIAGNOSIS — N131 Hydronephrosis with ureteral stricture, not elsewhere classified: Secondary | ICD-10-CM | POA: Diagnosis not present

## 2019-04-16 ENCOUNTER — Other Ambulatory Visit: Payer: Self-pay | Admitting: Pharmacist

## 2019-04-16 MED ORDER — INSULIN GLARGINE 100 UNIT/ML SOLOSTAR PEN: PEN_INJECTOR | SUBCUTANEOUS | 2 refills | Status: DC

## 2019-04-16 MED ORDER — LIRAGLUTIDE 18 MG/3ML ~~LOC~~ SOPN: 1.8000 mg | PEN_INJECTOR | Freq: Every day | SUBCUTANEOUS | 2 refills | Status: DC

## 2019-04-22 ENCOUNTER — Other Ambulatory Visit: Payer: Self-pay

## 2019-04-22 ENCOUNTER — Ambulatory Visit: Payer: Medicare Other | Attending: Nurse Practitioner | Admitting: Pharmacist

## 2019-04-22 ENCOUNTER — Encounter: Payer: Self-pay | Admitting: Pharmacist

## 2019-04-22 DIAGNOSIS — E669 Obesity, unspecified: Secondary | ICD-10-CM | POA: Diagnosis not present

## 2019-04-22 DIAGNOSIS — E1165 Type 2 diabetes mellitus with hyperglycemia: Secondary | ICD-10-CM | POA: Diagnosis not present

## 2019-04-22 DIAGNOSIS — E1122 Type 2 diabetes mellitus with diabetic chronic kidney disease: Secondary | ICD-10-CM | POA: Diagnosis not present

## 2019-04-22 DIAGNOSIS — N189 Chronic kidney disease, unspecified: Secondary | ICD-10-CM | POA: Insufficient documentation

## 2019-04-22 DIAGNOSIS — Z6837 Body mass index (BMI) 37.0-37.9, adult: Secondary | ICD-10-CM | POA: Insufficient documentation

## 2019-04-22 DIAGNOSIS — E119 Type 2 diabetes mellitus without complications: Secondary | ICD-10-CM | POA: Diagnosis present

## 2019-04-22 DIAGNOSIS — Z794 Long term (current) use of insulin: Secondary | ICD-10-CM | POA: Diagnosis not present

## 2019-04-22 DIAGNOSIS — E781 Pure hyperglyceridemia: Secondary | ICD-10-CM | POA: Diagnosis not present

## 2019-04-22 DIAGNOSIS — I129 Hypertensive chronic kidney disease with stage 1 through stage 4 chronic kidney disease, or unspecified chronic kidney disease: Secondary | ICD-10-CM | POA: Insufficient documentation

## 2019-04-22 LAB — POCT GLYCOSYLATED HEMOGLOBIN (HGB A1C): Hemoglobin A1C: 7.5 % — AB (ref 4.0–5.6)

## 2019-04-22 LAB — GLUCOSE, POCT (MANUAL RESULT ENTRY): POC Glucose: 95 mg/dl (ref 70–99)

## 2019-04-22 NOTE — Progress Notes (Signed)
   S:    PCP: Geryl Rankins  No chief complaint on file.  Patient arrives in good spirits. Presents for diabetes management at the request of Zelda. Patient was referred on 08/08/18 and was last seen by PCP on that day. I last saw her 10/07/18.   PMH: 68 YO F w/ DM (dx 2012), hypertriglyceridemia, HTN, CKD (1 kidney - eGFR 42 in 11/2017), obesity (BMI 37.42)   Family/Social History:  - Father (deceased): DM, MI  - Mother (deceased): DM - Sister: DM - Tobacco: never smoker - Alcohol: occasionally, specific amount/frequency not given.  Insurance coverage/medication affordability: coordinates MAP with [facility]  Patient reports adherence to DM medications.  - Lantus 50 units daily.  - Victoza 1.8 mg daily  Patient denies hypoglycemic events.   Patient eats 2-3 meals/day.  - Follows good diabetic diet - Denies sweets or sweetened beverages  Patient reports exercise.  - Uses the elliptical twice daily    Patient denies polyuria/polydipsia.  Patient denies neuropathy. Patient denies visual changes. Patient reports foot exams.   O:  POCT glucose:  95 Fasting home sugars: 70s - 120s  Post prandial/random CBG at home: not checking consistently   BMET    Component Value Date/Time   NA 140 02/18/2019 0814   NA 144 12/01/2018 1214   K 3.9 02/18/2019 0814   CL 106 02/18/2019 0814   CO2 26 02/18/2019 0814   GLUCOSE 93 02/18/2019 0814   BUN 22 02/18/2019 0814   BUN 18 12/01/2018 1214   CREATININE 1.36 (H) 02/18/2019 0814   CREATININE 1.21 (H) 11/08/2016 1229   CALCIUM 9.1 02/18/2019 0814   GFRNONAA 40 (L) 02/18/2019 0814   GFRNONAA 46 (L) 11/08/2016 1229   GFRAA 46 (L) 02/18/2019 0814   GFRAA 53 (L) 11/08/2016 1229   Lab Results  Component Value Date   HGBA1C 7.5 (A) 04/22/2019   There were no vitals filed for this visit.  Lipid Panel     Component Value Date/Time   CHOL 138 12/01/2018 1214   TRIG 111 12/01/2018 1214   HDL 36 (L) 12/01/2018 1214   CHOLHDL 3.8 12/01/2018 1214   CHOLHDL 6.4 (H) 11/08/2016 1229   VLDL 78 (H) 11/08/2016 1229   [place] 80 12/01/2018 1214    Clinical ASCVD: No ;19.6%  A/P: 1. DM Diabetes longstanding currently uncontrolled. Patient is adherent with medication. Pt is tolerating Victoza well. A1c is 7.5; pt > 65 YO. Per ADA guidelines, her A1c goal is <7.5. Will maintain current regimen and have her follow-up with Dr. Chapman Fitch.  -Continue Lantus -Continued Victoza 1.8 mg daily -Extensively discussed pathophysiology of DM, recommended lifestyle interventions, dietary effects on glycemic control  ASCVD risk - primary prevention in patient with DM. She has intolerances to statins. Additionally, pt has significant albuminuria w/ CKD and DM. She is amenable to re-start lisinopril. .  - Restarted lisinopril 2.5 mg daily - Pt refused statins,  Does not with to start Zetia  HM:  UTD on vaccines  Written patient instructions provided.  Total time in face to face counseling 30 minutes.   Follow up with PCP.  Jonathon Bellows PharmD Candidate [phone] [facility]   Benard Halsted, PharmD, [place] [phone]

## 2019-04-22 NOTE — Patient Instructions (Signed)
Thank you for coming to see me today. Please do the following:  1. Continue current regimen.  2. Try to start checking more after your biggest meal of the day and before bedtime.  3. Continue making the lifestyle changes we've discussed together during our visit. Diet and exercise play a significant role in improving your blood sugars.  4. Follow-up with PCP next month.    Hypoglycemia or low blood sugar:   Low blood sugar can happen quickly and may become an emergency if not treated right away.   While this shouldn't happen often, it can be brought upon if you skip a meal or do not eat enough. Also, if your insulin or other diabetes medications are dosed too high, this can cause your blood sugar to go to low.   Warning signs of low blood sugar include: 1. Feeling shaky or dizzy 2. Feeling weak or tired  3. Excessive hunger 4. Feeling anxious or upset  5. Sweating even when you aren't exercising  What to do if I experience low blood sugar? 1. Check your blood sugar with your meter. If lower than 70, proceed to step 2.  2. Treat with 3-4 glucose tablets or 3 packets of regular sugar. If these aren't around, you can try hard candy. Yet another option would be to drink 4 ounces of fruit juice or 6 ounces of REGULAR soda.  3. Re-check your sugar in 15 minutes. If it is still below 70, do what you did in step 2 again. If has come back up, go ahead and eat a snack or small meal at this time.

## 2019-04-28 MED FILL — LISINOPRIL 2.5 MG TABLET: 2.5 | 30 days supply | Qty: 30 | Fill #0

## 2019-04-28 MED FILL — ?FENOFIBRATE 145 MG TABLET: 145 | 30 days supply | Qty: 30 | Fill #7

## 2019-05-07 ENCOUNTER — Encounter: Payer: Self-pay | Admitting: Family Medicine

## 2019-05-07 ENCOUNTER — Other Ambulatory Visit: Payer: Self-pay

## 2019-05-07 ENCOUNTER — Ambulatory Visit: Payer: Medicare Other | Attending: Family Medicine | Admitting: Family Medicine

## 2019-05-07 VITALS — BP 128/72 | HR 77 | Temp 98.1°F | Ht 67.0 in | Wt 214.0 lb

## 2019-05-07 DIAGNOSIS — Z79899 Other long term (current) drug therapy: Secondary | ICD-10-CM

## 2019-05-07 DIAGNOSIS — Z833 Family history of diabetes mellitus: Secondary | ICD-10-CM | POA: Insufficient documentation

## 2019-05-07 DIAGNOSIS — I129 Hypertensive chronic kidney disease with stage 1 through stage 4 chronic kidney disease, or unspecified chronic kidney disease: Secondary | ICD-10-CM | POA: Diagnosis not present

## 2019-05-07 DIAGNOSIS — E785 Hyperlipidemia, unspecified: Secondary | ICD-10-CM | POA: Diagnosis not present

## 2019-05-07 DIAGNOSIS — Z794 Long term (current) use of insulin: Secondary | ICD-10-CM | POA: Insufficient documentation

## 2019-05-07 DIAGNOSIS — L989 Disorder of the skin and subcutaneous tissue, unspecified: Secondary | ICD-10-CM

## 2019-05-07 DIAGNOSIS — Z87442 Personal history of urinary calculi: Secondary | ICD-10-CM | POA: Insufficient documentation

## 2019-05-07 DIAGNOSIS — E1169 Type 2 diabetes mellitus with other specified complication: Secondary | ICD-10-CM

## 2019-05-07 DIAGNOSIS — Z85038 Personal history of other malignant neoplasm of large intestine: Secondary | ICD-10-CM | POA: Diagnosis not present

## 2019-05-07 DIAGNOSIS — R0989 Other specified symptoms and signs involving the circulatory and respiratory systems: Secondary | ICD-10-CM | POA: Diagnosis not present

## 2019-05-07 DIAGNOSIS — Z9884 Bariatric surgery status: Secondary | ICD-10-CM | POA: Diagnosis not present

## 2019-05-07 DIAGNOSIS — E1122 Type 2 diabetes mellitus with diabetic chronic kidney disease: Secondary | ICD-10-CM | POA: Insufficient documentation

## 2019-05-07 DIAGNOSIS — N183 Chronic kidney disease, stage 3 unspecified: Secondary | ICD-10-CM

## 2019-05-07 DIAGNOSIS — N132 Hydronephrosis with renal and ureteral calculous obstruction: Secondary | ICD-10-CM | POA: Insufficient documentation

## 2019-05-07 DIAGNOSIS — R82998 Other abnormal findings in urine: Secondary | ICD-10-CM

## 2019-05-07 DIAGNOSIS — E1165 Type 2 diabetes mellitus with hyperglycemia: Secondary | ICD-10-CM | POA: Diagnosis present

## 2019-05-07 DIAGNOSIS — I10 Essential (primary) hypertension: Secondary | ICD-10-CM | POA: Diagnosis not present

## 2019-05-07 LAB — POCT URINALYSIS DIP (CLINITEK)
Bilirubin, UA: NEGATIVE
Glucose, UA: NEGATIVE mg/dL
Ketones, POC UA: NEGATIVE mg/dL
Nitrite, UA: NEGATIVE
POC PROTEIN,UA: 30 — AB
Spec Grav, UA: 1.02
Urobilinogen, UA: 0.2 U/dL
pH, UA: 5

## 2019-05-07 NOTE — Progress Notes (Signed)
Established Patient Office Visit  Subjective:  Patient ID: Catherine Fowler, female    DOB: May 16, 1951  Age: 68 y.o. MRN: [id number]  CC: multiple concerns, would like to have a placard for handicap parking  HPI Catherine Fowler, 68 yo female patient of Catherine Rankins, NP, who is being seen in follow-up of chronic medical issues for which she was last seen 12/01/2018. Current medical issues include Type 2 diabetes with last Hgb A1c of 7.4 on 12/01/2018 which was improved from hemoglobin A1c of 9.3 previously.  Patient reports that about 2 years ago her hemoglobin A1c was around 11.  She thinks that the clinical pharmacist here has helped her greatly improve her control of her blood sugars.  She also reports that a few months ago she decided to get serious and she completely stopped any candy or sweets and soda and also began exercising.  She reports that she was initially using a treadmill then her daughter convinced her to do an elliptical which would be easier on the patient's joints.  Patient reports that about 2 years ago she had to have abdominal surgery secondary to colon cancer and she had issues with poor healing at the wound site for which she had to attend wound care several times per week.  She states that she was told that she needed to improve her hemoglobin A1c before second surgery could be done and she was slightly able to decrease her hemoglobin A1c and insisted that the second surgery be performed.  She states that after that time, she continued to have issues with weight gain and fatigue but has now lost most of the weight that she gained a few years ago.  She reports that she feels much better since losing weight and starting to exercise regularly.  She however states that when she is walking, she sometimes feels as if she gets fatigued and her legs feel heavy.  She reports that she feels that she needs a handicap placard so she will not have to park so far away when she goes to the mall in  order to walk for exercise.  She states that if she has to park further away than she is tired by the time she gets into the mall.      She continues to take her cholesterol medication and denies any issues with increased muscle or joint pain with use of her cholesterol medication.  She reports that she was recently started on a low-dose of medication for her blood pressure as well as to help with renal protection.  She states that she found out a few years ago that she only has 1 functioning kidney.  She states that she was told that a large kidney stone had blocked off her left kidney causing it to deteriorate and therefore only her right kidney is functioning.  She states that she tries to be really careful regarding any over-the-counter medicines, supplements as well as trying to remain hydrated in order to help with her kidney function.  She believes that she tends to urinate a lot because she drinks water throughout the day.  She has noticed some recent onset of the sudden urge to urinate but she denies any urinary incontinence.  She denies any current back pain, no nausea or vomiting and no fever or chills.  She reports that she has had recent blood work done when she recently met with the clinical pharmacist.  She reports that she tends to mostly use her  abdomen as the site of injections for her insulin as she does not feel that the insulin is absorbed properly if she uses other parts of her body such as her thighs.      She denies any issues with numbness or tingling in her hands or feet.  She does have a history of gastric bypass surgery in addition to her surgery for removal of part of the colon due to colon cancer 2 years ago.  She states that she did take multivitamins in the past but was told that she did not need them by a past provider.  Past Medical History:  Diagnosis Date   Chronic kidney disease    lost kidney to a kidney stone.    Colon cancer ([place]) 09/18/13   Diabetes mellitus      Past Surgical History:  Procedure Laterality Date   ABDOMINAL SURGERY     APPENDECTOMY     BREAST SURGERY  2/97   breast reduction    CHOLECYSTECTOMY     GASTRIC BYPASS  10/16/2004   HERNIA REPAIR     RHINOPLASTY     TUBAL LIGATION     VENTRAL HERNIA REPAIR  06/17/2012   Procedure: HERNIA REPAIR VENTRAL ADULT;  Surgeon: Adin Hector, MD;  Location: [facility];  Service: General;  Laterality: N/A;    Family History  Problem Relation Age of Onset   Diabetes type II Father    Diabetes type II Other    Diabetes type II Sister    Diabetes type II Maternal Aunt     Social History   Tobacco Use   Smoking status: Never Smoker   Smokeless tobacco: Never Used  Substance Use Topics   Alcohol use: No   Drug use: No    Outpatient Medications Prior to Visit  Medication Sig Dispense Refill   fenofibrate (TRICOR) 145 MG tablet Take 1 tablet (145 mg total) by mouth daily. 90 tablet 3   glucose blood (WAVESENSE PRESTO) test strip Use as instructed 100 each 12   glucose monitoring kit (FREESTYLE) monitoring kit 1 each by Does not apply route 4 (four) times daily - after meals and at bedtime. 1 month Diabetic Testing Supplies for QAC-QHS accuchecks. Any brand okay 1 each 1   HYDROcodone-acetaminophen (NORCO/VICODIN) 5-325 MG tablet Take 1 tablet by mouth every 4 (four) hours as needed for moderate pain. 15 tablet 0   Insulin Glargine (LANTUS SOLOSTAR) 100 UNIT/ML Solostar Pen INJECT 55 UNITS INTO THE SKIN 2 TIMES A DAY. 33 mL 2   liraglutide (VICTOZA) 18 MG/3ML SOPN Inject 0.3 mLs (1.8 mg total) into the skin daily. 9 mL 2   lisinopril (PRINIVIL,ZESTRIL) 2.5 MG tablet Take 1 tablet (2.5 mg total) by mouth daily. (Patient not taking: Reported on 02/18/2019) 90 tablet 1   traZODone (DESYREL) 100 MG tablet Take 1 tablet (100 mg total) by mouth at bedtime. (Patient not taking: Reported on 08/08/2018) 90 tablet 1   No facility-administered medications prior to visit.      Allergies  Allergen Reactions   Pravastatin Sodium Itching and Rash   Simvastatin Rash    ROS Review of Systems  Constitutional: Positive for fatigue (improved since exercising and losing weight). Negative for chills and fever.  HENT: Negative for sore throat and trouble swallowing.   Eyes: Negative for photophobia and visual disturbance.  Respiratory: Negative for cough and shortness of breath.   Cardiovascular: Negative for chest pain, palpitations and leg swelling.  Gastrointestinal: Negative for constipation and  diarrhea.  Endocrine: Positive for polyuria (due to drinking lots of water per patient). Negative for polydipsia and polyphagia.  Genitourinary: Positive for frequency and urgency.  Neurological: Negative for dizziness and headaches.  Hematological: Negative for adenopathy. Does not bruise/bleed easily.      Objective:    Physical Exam  Constitutional: She is oriented to person, place, and time. She appears well-developed and well-nourished.  Overweight for height older female in NAD  Neck: Normal range of motion. Neck supple. No thyromegaly present.  No carotid bruits  Cardiovascular: Normal rate and regular rhythm.  Poorly palpable dorsalis pedis and posterior tibial pulses bilaterally  Pulmonary/Chest: Effort normal and breath sounds normal.  Abdominal: Soft. There is no abdominal tenderness. There is no rebound.  Truncal obesity  Musculoskeletal:        General: No edema.  Lymphadenopathy:    She has no cervical adenopathy.  Neurological: She is alert and oriented to person, place, and time.  Skin: Skin is warm and dry.  Asymmetric hyperpigmented area on the right upper back/posterior shoulder area that has a roughened raised border and she has some other hyperpigmented macular areas on the back and arms/back of the hands; hammertoe deformities of toes  Nursing note and vitals reviewed. Sensory exam of the foot is normal, tested with the monofilament.  Hammertoe deformities; decreased peripheral pulses  BP 128/72 (BP Location: Right Arm, Patient Position: Sitting, Cuff Size: Large)    Pulse 77    Temp 98.1 F (36.7 C) (Oral)    Ht '5\' 7"'  (1.702 m)    Wt 214 lb (97.1 kg)    SpO2 96%    BMI 33.52 kg/m  Wt Readings from Last 3 Encounters:  02/18/19 217 lb (98.4 kg)  12/01/18 218 lb 9.6 oz (99.2 kg)  08/08/18 236 lb (107 kg)     Health Maintenance Due  Topic Date Due   Hepatitis C Screening  1951-01-05   DEXA SCAN  03/05/2014   OPHTHALMOLOGY EXAM  06/27/2018   MAMMOGRAM  12/06/2018    Lab Results  Component Value Date   TSH 0.502 08/14/2018   Lab Results  Component Value Date   WBC 6.8 02/18/2019   HGB 14.5 02/18/2019   HCT 44.0 02/18/2019   MCV 90.9 02/18/2019   PLT 246 02/18/2019   Lab Results  Component Value Date   NA 140 02/18/2019   K 3.9 02/18/2019   CO2 26 02/18/2019   GLUCOSE 93 02/18/2019   BUN 22 02/18/2019   CREATININE 1.36 (H) 02/18/2019   BILITOT 0.5 02/18/2019   ALKPHOS 98 02/18/2019   AST 21 02/18/2019   ALT 15 02/18/2019   PROT 7.6 02/18/2019   ALBUMIN 4.1 02/18/2019   CALCIUM 9.1 02/18/2019   ANIONGAP 8 02/18/2019   Lab Results  Component Value Date   CHOL 138 12/01/2018   Lab Results  Component Value Date   HDL 36 (L) 12/01/2018   Lab Results  Component Value Date   LDLCALC 80 12/01/2018   Lab Results  Component Value Date   TRIG 111 12/01/2018   Lab Results  Component Value Date   CHOLHDL 3.8 12/01/2018   Lab Results  Component Value Date   HGBA1C 7.5 (A) 04/22/2019      Assessment & Plan:  1. Uncontrolled type 2 diabetes mellitus with hyperglycemia (HCC) On review of patient's records, she has had great improvement over time in the control of her diabetes and she now reports that her fasting blood sugars are  generally less than 120.  She denies any hypoglycemic episodes.  Patient will continue her current medication regimen.  She was encouraged to rotate insulin  injection sites.  She has had most recent hemoglobin A1c of 7.4.  She will continue clinical pharmacist follow-up.  She will have CMP, urinalysis and vitamin B12 in follow-up of her diabetes and medication use.  Patient is also being referred to vascular surgery due to poorly palpable distal lower extremity pulses as well as complaint of leg fatigue.  Diabetic foot care was also discussed.  Patient has some hammertoe deformities.  Discussed using caution with having pedicures due to increased potential for infection.  Diabetic foot care discussed at today's visit. - Ambulatory referral to Vascular Surgery - POCT URINALYSIS DIP (CLINITEK) - Comprehensive metabolic panel - Vitamin W10  2. Essential hypertension Blood pressure today's visit is well controlled and patient reports she was recently started on low-dose lisinopril 2.5 mg for control of hypertension and renal protection.  Patient has history of microalbuminuria, chronic kidney disease and only one functional kidney.  She will also be referred to nephrology.  3. Dyslipidemia associated with type 2 diabetes Per chart, patient has had difficulty tolerating statin medication in the past.  She is currently on fenofibrate.  She did have lipid panel done in January of this year and LDL is near goal of 70 or less.  Most recent LDL was 80, triglycerides normal at 111 and mild decrease in HDL at 36.  Continue healthy, low-fat diet as well as regular exercise and fenofibrate.  Complete metabolic panel to check electrolytes and liver function and follow-up with medication use. -Complete metabolic panel   4. Stage 3 chronic kidney disease ([place]) Patient with stage III chronic kidney disease likely secondary to her history of uncontrolled diabetes.  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  Patient will be referred to  nephrology in follow-up of her chronic kidney disease.  She reports that she was recently started on lisinopril 2.5 mg for control of blood pressure as well as renal protection and patient additionally has had microalbumin/creatinine ratio in January of this year which was above normal at 45 but much improved from prior value of 510 the year prior.  Patient with creatinine of 1.36 in April of this year.  Patient will also have urinalysis to look for proteinuria. - Ambulatory referral to Nephrology - POCT URINALYSIS DIP (CLINITEK)  5. Encounter for long-term current use of medication Patient will have vitamin B12 level and complete metabolic panel in follow-up of long-term use of medications for treatment of diabetes, hypertension and dyslipidemia - Vitamin B12  6. History of gastric bypass Patient with history of gastric bypass which increases her risk for nutritional deficiencies including B12 deficiency and patient will have vitamin B12 level done at today's visit - Vitamin B12  7. Skin lesion of back Patient will be referred to dermatology for general skin check.  Patient also with a abnormal lesion on the right upper back/posterior shoulder area. - Ambulatory referral to Dermatology  8. Abnormal peripheral pulse Patient with complaint of leg fatigue with walking.  She denies any past use of tobacco products.  She does have known arterial atherosclerosis and dyslipidemia in addition to diabetes.  Patient with poorly palpable peripheral pulses on exam.  She has been referred to vascular surgery for further evaluation.  Handicap placard  request form was filled out for the patient at today's visit. - Ambulatory referral to Vascular Surgery  9. Leukocytes in urine Patient with complaint of recent onset of urinary urgency and she had leukocytes in the urine at today's visit.  Urine will be sent for culture and patient will be notified if antibiotic treatment is needed based on the culture  results. - Urine Culture  An After Visit Summary was printed and given to the patient.  Follow-up: Return in about 4 months (around 09/06/2019) for DM and chronic issues/flu shot-PCP and keep scheduled f/u with [facility]   Antony Blackbird, MD

## 2019-05-07 NOTE — Progress Notes (Signed)
New Patient,  Patient would like Handicap Placker for 1 yr due to not having stamula like she use to and getting tired faster,   Patient would like to talk about her urination frequency and talk about possible wearing depends   Diabetes management

## 2019-05-08 LAB — COMPREHENSIVE METABOLIC PANEL WITH GFR
ALT: 18 IU/L (ref 0–32)
AST: 22 IU/L (ref 0–40)
Albumin/Globulin Ratio: 1.6 (ref 1.2–2.2)
Albumin: 4.3 g/dL (ref 3.8–4.8)
Alkaline Phosphatase: 104 IU/L (ref 39–117)
BUN/Creatinine Ratio: 8 — ABNORMAL LOW (ref 12–28)
BUN: 13 mg/dL (ref 8–27)
Bilirubin Total: 0.3 mg/dL (ref 0.0–1.2)
CO2: 21 mmol/L (ref 20–29)
Calcium: 9.6 mg/dL (ref 8.7–10.3)
Chloride: 104 mmol/L (ref 96–106)
Creatinine, Ser: 1.53 mg/dL — ABNORMAL HIGH (ref 0.57–1.00)
GFR calc Af Amer: 39 mL/min/1.73 — ABNORMAL LOW
GFR calc non Af Amer: 34 mL/min/1.73 — ABNORMAL LOW
Globulin, Total: 2.7 g/dL (ref 1.5–4.5)
Glucose: 66 mg/dL (ref 65–99)
Potassium: 4.5 mmol/L (ref 3.5–5.2)
Sodium: 143 mmol/L (ref 134–144)
Total Protein: 7 g/dL (ref 6.0–8.5)

## 2019-05-08 LAB — VITAMIN B12: Vitamin B-12: 725 pg/mL (ref 232–1245)

## 2019-05-09 LAB — URINE CULTURE

## 2019-05-11 ENCOUNTER — Other Ambulatory Visit: Payer: Self-pay | Admitting: Family Medicine

## 2019-05-11 DIAGNOSIS — I10 Essential (primary) hypertension: Secondary | ICD-10-CM

## 2019-05-11 MED ORDER — AMLODIPINE BESYLATE 2.5 MG PO TABS: 2.5000 mg | ORAL_TABLET | Freq: Every day | ORAL | 3 refills | Status: DC

## 2019-05-11 MED FILL — AMLODIPINE BESYLATE 2.5 MG: 2.5 | 30 days supply | Qty: 30 | Fill #0

## 2019-05-11 NOTE — Progress Notes (Unsigned)
Patient ID: Catherine Fowler, female   DOB: Jun 04, 1951, 68 y.o.   MRN: [id number]   Patient contacted by phone due to an increase in her creatinine level following new start of lisinopril. Patient has been asked to stop the lisinipril

## 2019-05-12 ENCOUNTER — Encounter: Payer: Self-pay | Admitting: Vascular Surgery

## 2019-06-02 ENCOUNTER — Telehealth: Payer: Self-pay | Admitting: Family Medicine

## 2019-06-02 NOTE — Telephone Encounter (Signed)
Attempted to reach patient unable to reach

## 2019-06-02 NOTE — Telephone Encounter (Signed)
-----   Message from Carilyn Goodpasture, RN sent at 06/01/2019  4:57 PM EDT ----- Regarding: appointment with PCP Patient trying to get in the for an appointment for medication management. Please call and schedule an appointment

## 2019-06-08 MED FILL — ?FENOFIBRATE 145 MG TABLET: 145 | 90 days supply | Qty: 90 | Fill #0

## 2019-06-09 MED FILL — ?AMLODIPINE BESYLATE 2.5MG.: 2.5 | 30 days supply | Qty: 30 | Fill #1

## 2019-06-17 ENCOUNTER — Ambulatory Visit: Payer: Medicare Other | Admitting: Family Medicine

## 2019-07-15 ENCOUNTER — Other Ambulatory Visit: Payer: Self-pay | Admitting: Pharmacist

## 2019-07-15 MED ORDER — VICTOZA 18 MG/3ML ~~LOC~~ SOPN: 1.8000 mg | PEN_INJECTOR | Freq: Every day | SUBCUTANEOUS | 2 refills | Status: DC

## 2019-07-15 MED ORDER — LANTUS SOLOSTAR 100 UNIT/ML ~~LOC~~ SOPN: PEN_INJECTOR | SUBCUTANEOUS | 2 refills | Status: DC

## 2019-07-21 MED FILL — ?AMLODIPINE BESYLATE 2.5MG.: 2.5 | 30 days supply | Qty: 30 | Fill #2

## 2019-07-31 DIAGNOSIS — Z23 Encounter for immunization: Secondary | ICD-10-CM | POA: Diagnosis not present

## 2019-08-26 MED FILL — AMLODIPINE BESYLATE 2.5 MG: 2.5 | 30 days supply | Qty: 30 | Fill #3

## 2019-09-15 ENCOUNTER — Other Ambulatory Visit: Payer: Self-pay | Admitting: Family Medicine

## 2019-09-23 ENCOUNTER — Other Ambulatory Visit: Payer: Self-pay | Admitting: Pharmacist

## 2019-09-23 MED ORDER — VICTOZA 18 MG/3ML ~~LOC~~ SOPN: 1.8000 mg | PEN_INJECTOR | Freq: Every day | SUBCUTANEOUS | 2 refills | Status: DC

## 2019-09-25 ENCOUNTER — Other Ambulatory Visit: Payer: Self-pay | Admitting: Nurse Practitioner

## 2019-09-25 DIAGNOSIS — E785 Hyperlipidemia, unspecified: Secondary | ICD-10-CM

## 2019-09-25 MED FILL — FENOFIBRATE 145 MG TABLET: 145 | 30 days supply | Qty: 30 | Fill #0

## 2019-09-28 DIAGNOSIS — Z008 Encounter for other general examination: Secondary | ICD-10-CM | POA: Diagnosis not present

## 2019-09-28 DIAGNOSIS — E119 Type 2 diabetes mellitus without complications: Secondary | ICD-10-CM | POA: Diagnosis not present

## 2019-09-28 DIAGNOSIS — I10 Essential (primary) hypertension: Secondary | ICD-10-CM | POA: Diagnosis not present

## 2019-09-28 DIAGNOSIS — Z794 Long term (current) use of insulin: Secondary | ICD-10-CM | POA: Diagnosis not present

## 2019-09-28 DIAGNOSIS — R3915 Urgency of urination: Secondary | ICD-10-CM | POA: Diagnosis not present

## 2019-09-28 DIAGNOSIS — Z79899 Other long term (current) drug therapy: Secondary | ICD-10-CM | POA: Diagnosis not present

## 2019-09-28 DIAGNOSIS — Z85038 Personal history of other malignant neoplasm of large intestine: Secondary | ICD-10-CM | POA: Diagnosis not present

## 2019-09-28 DIAGNOSIS — Z0000 Encounter for general adult medical examination without abnormal findings: Secondary | ICD-10-CM | POA: Diagnosis not present

## 2019-09-28 DIAGNOSIS — Z6831 Body mass index (BMI) 31.0-31.9, adult: Secondary | ICD-10-CM | POA: Diagnosis not present

## 2019-09-28 DIAGNOSIS — E669 Obesity, unspecified: Secondary | ICD-10-CM | POA: Diagnosis not present

## 2019-09-28 DIAGNOSIS — Z1159 Encounter for screening for other viral diseases: Secondary | ICD-10-CM | POA: Diagnosis not present

## 2019-09-28 DIAGNOSIS — E785 Hyperlipidemia, unspecified: Secondary | ICD-10-CM | POA: Diagnosis not present

## 2019-10-13 MED FILL — FENOFIBRATE 145 MG TABLET: 145 | 30 days supply | Qty: 30 | Fill #0

## 2019-10-13 MED FILL — AMLODIPINE BESYLATE 2.5 MG: 2.5 | 30 days supply | Qty: 30 | Fill #0

## 2019-10-13 MED FILL — SULFAMETHOXAZOLE-TMP DS TAB: 800-160 | 5 days supply | Qty: 10 | Fill #0

## 2019-10-16 DIAGNOSIS — F410 Panic disorder [episodic paroxysmal anxiety] without agoraphobia: Secondary | ICD-10-CM | POA: Diagnosis not present

## 2019-10-16 DIAGNOSIS — E669 Obesity, unspecified: Secondary | ICD-10-CM | POA: Diagnosis not present

## 2019-10-16 DIAGNOSIS — E785 Hyperlipidemia, unspecified: Secondary | ICD-10-CM | POA: Diagnosis not present

## 2019-10-16 DIAGNOSIS — F325 Major depressive disorder, single episode, in full remission: Secondary | ICD-10-CM | POA: Diagnosis not present

## 2019-10-16 DIAGNOSIS — E1122 Type 2 diabetes mellitus with diabetic chronic kidney disease: Secondary | ICD-10-CM | POA: Diagnosis not present

## 2019-10-16 DIAGNOSIS — I129 Hypertensive chronic kidney disease with stage 1 through stage 4 chronic kidney disease, or unspecified chronic kidney disease: Secondary | ICD-10-CM | POA: Diagnosis not present

## 2019-10-16 DIAGNOSIS — I70203 Unspecified atherosclerosis of native arteries of extremities, bilateral legs: Secondary | ICD-10-CM | POA: Diagnosis not present

## 2019-10-16 DIAGNOSIS — Z0001 Encounter for general adult medical examination with abnormal findings: Secondary | ICD-10-CM | POA: Diagnosis not present

## 2019-10-16 DIAGNOSIS — E1151 Type 2 diabetes mellitus with diabetic peripheral angiopathy without gangrene: Secondary | ICD-10-CM | POA: Diagnosis not present

## 2019-10-16 DIAGNOSIS — B079 Viral wart, unspecified: Secondary | ICD-10-CM | POA: Diagnosis not present

## 2019-10-16 DIAGNOSIS — I700 Atherosclerosis of aorta: Secondary | ICD-10-CM | POA: Diagnosis not present

## 2019-10-16 DIAGNOSIS — N183 Chronic kidney disease, stage 3 unspecified: Secondary | ICD-10-CM | POA: Diagnosis not present

## 2019-10-28 ENCOUNTER — Other Ambulatory Visit: Payer: Self-pay

## 2019-10-28 ENCOUNTER — Encounter: Payer: Self-pay | Admitting: Family Medicine

## 2019-10-28 ENCOUNTER — Ambulatory Visit: Payer: Medicare Other | Attending: Family Medicine | Admitting: Family Medicine

## 2019-10-28 DIAGNOSIS — E1121 Type 2 diabetes mellitus with diabetic nephropathy: Secondary | ICD-10-CM | POA: Diagnosis not present

## 2019-10-28 DIAGNOSIS — Z79899 Other long term (current) drug therapy: Secondary | ICD-10-CM

## 2019-10-28 DIAGNOSIS — I10 Essential (primary) hypertension: Secondary | ICD-10-CM | POA: Diagnosis not present

## 2019-10-28 DIAGNOSIS — N1832 Chronic kidney disease, stage 3b: Secondary | ICD-10-CM

## 2019-10-28 DIAGNOSIS — Z794 Long term (current) use of insulin: Secondary | ICD-10-CM | POA: Diagnosis not present

## 2019-10-28 DIAGNOSIS — E1169 Type 2 diabetes mellitus with other specified complication: Secondary | ICD-10-CM | POA: Diagnosis not present

## 2019-10-28 DIAGNOSIS — E785 Hyperlipidemia, unspecified: Secondary | ICD-10-CM | POA: Diagnosis not present

## 2019-10-28 MED ORDER — LANTUS SOLOSTAR 100 UNIT/ML ~~LOC~~ SOPN: PEN_INJECTOR | SUBCUTANEOUS | 11 refills | Status: AC

## 2019-10-28 MED ORDER — FENOFIBRATE 145 MG PO TABS: 145.0000 mg | ORAL_TABLET | Freq: Every day | ORAL | 1 refills | Status: AC

## 2019-10-28 MED ORDER — AMLODIPINE BESYLATE 2.5 MG PO TABS: 2.5000 mg | ORAL_TABLET | Freq: Every day | ORAL | 1 refills | Status: AC

## 2019-10-28 MED ORDER — VICTOZA 18 MG/3ML ~~LOC~~ SOPN: 1.8000 mg | PEN_INJECTOR | Freq: Every day | SUBCUTANEOUS | 11 refills | Status: AC

## 2019-10-28 MED ORDER — WAVESENSE PRESTO VI STRP: ORAL_STRIP | 11 refills | Status: AC

## 2019-10-28 NOTE — Progress Notes (Signed)
Virtual Visit via Telephone Note  I connected with Catherine Fowler on 10/28/19 at  9:30 AM EST by telephone and verified that I am speaking with the correct person using two identifiers.   I discussed the limitations, risks, security and privacy concerns of performing an evaluation and management service by telephone and the availability of in person appointments. I also discussed with the patient that there may be a patient responsible charge related to this service. The patient expressed understanding and agreed to proceed.  Patient Location: Home Provider Location: [facility] office  Others participating in call: none   History of Present Illness:       68 year old female seen in follow-up of chronic medical issues including type 2 diabetes requiring insulin, hypertension, stage III chronic kidney disease, solitary functioning kidney, and dyslipidemia.  She reports that overall she is doing well at this time.  She did follow-up with nephrology earlier in the year and was switched from lisinopril to amlodipine due to her chronic kidney disease and solitary functioning kidney.  She reports that she did meet here with the clinical pharmacist and was switched from glipizide to Victoza and she reports that her blood sugars are much improved.  She also continues to take Lantus.  Her highest fasting blood sugar has been 134 after having started the night before but generally blood sugars are between 1 15-1 17 fasting.  Her most recent blood pressure check was 134/82.  She has had no headaches or dizziness related to her blood pressure.  She denies any urinary frequency, increased thirst and no numbness or tingling in her feet related to her diabetes.  She has had a diabetic eye exam earlier this year.  She continues to take fenofibrate for her lipids.  She continues to follow a healthy diet.  Review of systems, she has had no fever or chills, no sore throat or difficulty swallowing, no chest pain or palpitations,  no shortness of breath or cough.  No abdominal pain-no nausea or vomiting.  No mid upper abdominal pain or diarrhea with the use of Victoza.  She will need medication refills as well as refill of test strips.   Past Medical History:  Diagnosis Date  . Chronic kidney disease    lost kidney to a kidney stone.   . Colon cancer ([place]) 09/18/13  . Diabetes mellitus     Past Surgical History:  Procedure Laterality Date  . ABDOMINAL SURGERY    . APPENDECTOMY    . BREAST SURGERY  2/97   breast reduction   . CHOLECYSTECTOMY    . GASTRIC BYPASS  10/16/2004  . HERNIA REPAIR    . RHINOPLASTY    . TUBAL LIGATION    . VENTRAL HERNIA REPAIR  06/17/2012   Procedure: HERNIA REPAIR VENTRAL ADULT;  Surgeon: Adin Hector, MD;  Location: [facility];  Service: General;  Laterality: N/A;    Family History  Problem Relation Age of Onset  . Diabetes type II Father   . Diabetes type II Other   . Diabetes type II Sister   . Diabetes type II Maternal Aunt     Social History   Tobacco Use  . Smoking status: Never Smoker  . Smokeless tobacco: Never Used  Substance Use Topics  . Alcohol use: No  . Drug use: No     Allergies  Allergen Reactions  . Pravastatin Sodium Itching and Rash  . Simvastatin Rash       Observations/Objective: No vital signs or  physical exam conducted as visit was done via telephone  Assessment and Plan: 1. Type 2 diabetes mellitus with stage 3b chronic kidney disease, with long-term current use of insulin ([place]) She reports improved control of blood sugars after addition of Victoza.  She denies any issues with mid upper abdominal pain suggestive of pancreatitis related to Victoza use.  Refills provided of Victoza and Lantus.  Test strips refill provided for continued monitoring of blood sugars.  Continue healthy diet.  Return for lab visit, fasting, to include hemoglobin A1c, lipid panel and comprehensive metabolic panel - Insulin Glargine (LANTUS SOLOSTAR) 100 UNIT/ML Solostar  Pen; INJECT 50 UNITS INTO THE SKIN 2 TIMES A DAY.  Dispense: 33 mL; Refill: 11 - liraglutide (VICTOZA) 18 MG/3ML SOPN; Inject 0.3 mLs (1.8 mg total) into the skin daily.  Dispense: 9 mL; Refill: 11 - glucose blood (WAVESENSE PRESTO) test strip; Use as instructed  Dispense: 100 each; Refill: 11 - Lipid panel; Future - Comprehensive metabolic panel; Future - Hemoglobin A1c; Future  2. Essential hypertension She reports that her blood pressure medicine, lisinopril was changed to amlodipine by her nephrologist.  She reports her blood pressure is well controlled.  Continue current medications, low-sodium diet, as well as nephrology follow-up. - amLODipine (NORVASC) 2.5 MG tablet; Take 1 tablet (2.5 mg total) by mouth daily. For blood pressure  Dispense: 90 tablet; Refill: 1 - Lipid panel; Future  3. Dyslipidemia associated with type 2 diabetes mellitus ([place]) She reports that she is tolerating fenofibrate without any issues.  She will return for fasting lipid panel.  Continue low-fat diet. - fenofibrate (TRICOR) 145 MG tablet; Take 1 tablet (145 mg total) by mouth daily.  Dispense: 90 tablet; Refill: 1 - Lipid panel; Future  4. Stage 3b chronic kidney disease She reports that she has follow-up with nephrology regarding stage III chronic kidney disease and patient only has 1 functioning kidney after having blockage of the other kidney by kidney stone resulting in loss of function.  She will return for comprehensive metabolic panel and CBC in follow-up of chronic kidney disease and to look for anemia associated with renal disease.  Continue to control blood pressure, remain hydrated, avoid nonsteroidal anti-inflammatories and continue nephrology follow-up. - Comprehensive metabolic panel; Future - CBC; Future  5. Encounter for long-term current use of medication She will return for fasting visit and follow-up of chronic medical issues and for comprehensive metabolic panel in follow-up of long-term use  of medications including fenofibrate and medication for treatment of diabetes and hypertension. - Comprehensive metabolic panel; Future  Follow Up Instructions:Return in about 4 months (around 02/26/2020) for chronic issues; fasting labs in 3-4 weeks.    I discussed the assessment and treatment plan with the patient. The patient was provided an opportunity to ask questions and all were answered. The patient agreed with the plan and demonstrated an understanding of the instructions.   The patient was advised to call back or seek an in-person evaluation if the symptoms worsen or if the condition fails to improve as anticipated.  I provided 14 minutes of non-face-to-face time during this encounter.   Antony Blackbird, MD

## 2019-10-28 NOTE — Progress Notes (Signed)
Patient verified DOB Patient has eaten today. Patient has taken medication today. CBG today was 123 before eating yogurt. Patient denies pain at this time. Fenofibrate, Amlodipine, Test Strips, Lantus, Victoza. Patient was to be sure she should only take 1 BP medication. States the lisinopril was changed to amlodipine.

## 2019-10-30 ENCOUNTER — Other Ambulatory Visit: Payer: Self-pay | Admitting: Family Medicine

## 2019-10-30 DIAGNOSIS — N1832 Chronic kidney disease, stage 3b: Secondary | ICD-10-CM

## 2019-10-30 DIAGNOSIS — Z794 Long term (current) use of insulin: Secondary | ICD-10-CM

## 2019-10-30 MED ORDER — TRULICITY 1.5 MG/0.5ML ~~LOC~~ SOAJ: 1.5000 mg | SUBCUTANEOUS | 11 refills | Status: AC

## 2019-10-30 NOTE — Progress Notes (Signed)
Patient ID: Catherine Fowler, female   DOB: 1951/11/04, 68 y.o.   MRN: [id number]   Patient needs a change in her Victozia  due to coverage and new RX sent to her pharmacy for Trulicity

## 2019-11-04 MED FILL — SULFAMETHOXAZOLE-TMP DS TAB: 800-160 | 5 days supply | Qty: 10 | Fill #0

## 2019-11-16 MED FILL — FENOFIBRATE 160 MG TABLET: 160 | 30 days supply | Qty: 30 | Fill #0

## 2019-11-16 MED FILL — ?AMLODIPINE BESYLATE 2.5MG.: 2.5 | 30 days supply | Qty: 30 | Fill #0

## 2019-11-23 DIAGNOSIS — Z6834 Body mass index (BMI) 34.0-34.9, adult: Secondary | ICD-10-CM | POA: Diagnosis not present

## 2019-11-23 DIAGNOSIS — L819 Disorder of pigmentation, unspecified: Secondary | ICD-10-CM | POA: Diagnosis not present

## 2019-11-23 DIAGNOSIS — E669 Obesity, unspecified: Secondary | ICD-10-CM | POA: Diagnosis not present

## 2019-11-23 DIAGNOSIS — L918 Other hypertrophic disorders of the skin: Secondary | ICD-10-CM | POA: Diagnosis not present

## 2019-11-23 DIAGNOSIS — Z794 Long term (current) use of insulin: Secondary | ICD-10-CM | POA: Diagnosis not present

## 2019-11-23 DIAGNOSIS — E1151 Type 2 diabetes mellitus with diabetic peripheral angiopathy without gangrene: Secondary | ICD-10-CM | POA: Diagnosis not present

## 2019-11-23 DIAGNOSIS — R3915 Urgency of urination: Secondary | ICD-10-CM | POA: Diagnosis not present

## 2019-11-23 DIAGNOSIS — Z008 Encounter for other general examination: Secondary | ICD-10-CM | POA: Diagnosis not present

## 2019-11-25 MED FILL — PHENAZOPYRIDINE 100 MG TAB: 100 | 2 days supply | Qty: 12 | Fill #0

## 2019-11-30 MED FILL — CIPROFLOXACIN HCL 500 MG TA: 500 | 7 days supply | Qty: 14 | Fill #0

## 2019-12-01 MED FILL — TRULICITY 0.75 MG/0.5 ML PE: 0.75 | 28 days supply | Qty: 2 | Fill #0

## 2019-12-17 DIAGNOSIS — R3915 Urgency of urination: Secondary | ICD-10-CM | POA: Diagnosis not present

## 2019-12-17 DIAGNOSIS — R300 Dysuria: Secondary | ICD-10-CM | POA: Diagnosis not present

## 2019-12-17 DIAGNOSIS — R358 Other polyuria: Secondary | ICD-10-CM | POA: Diagnosis not present

## 2019-12-17 DIAGNOSIS — R339 Retention of urine, unspecified: Secondary | ICD-10-CM | POA: Diagnosis not present

## 2019-12-17 MED FILL — FENOFIBRATE 160 MG TABLET: 160 | 30 days supply | Qty: 30 | Fill #1

## 2019-12-17 MED FILL — ?AMLODIPINE BESYLATE 2.5MG.: 2.5 | 30 days supply | Qty: 30 | Fill #1

## 2019-12-22 DIAGNOSIS — Z6834 Body mass index (BMI) 34.0-34.9, adult: Secondary | ICD-10-CM | POA: Diagnosis not present

## 2019-12-22 DIAGNOSIS — Z23 Encounter for immunization: Secondary | ICD-10-CM | POA: Diagnosis not present

## 2019-12-22 DIAGNOSIS — I129 Hypertensive chronic kidney disease with stage 1 through stage 4 chronic kidney disease, or unspecified chronic kidney disease: Secondary | ICD-10-CM | POA: Diagnosis not present

## 2019-12-22 DIAGNOSIS — Z008 Encounter for other general examination: Secondary | ICD-10-CM | POA: Diagnosis not present

## 2019-12-22 DIAGNOSIS — Z0000 Encounter for general adult medical examination without abnormal findings: Secondary | ICD-10-CM | POA: Diagnosis not present

## 2019-12-22 DIAGNOSIS — E785 Hyperlipidemia, unspecified: Secondary | ICD-10-CM | POA: Diagnosis not present

## 2019-12-22 DIAGNOSIS — R3915 Urgency of urination: Secondary | ICD-10-CM | POA: Diagnosis not present

## 2019-12-22 DIAGNOSIS — N183 Chronic kidney disease, stage 3 unspecified: Secondary | ICD-10-CM | POA: Diagnosis not present

## 2020-01-05 DIAGNOSIS — N200 Calculus of kidney: Secondary | ICD-10-CM | POA: Diagnosis not present

## 2020-01-05 DIAGNOSIS — N3592 Unspecified urethral stricture, female: Secondary | ICD-10-CM | POA: Diagnosis not present

## 2020-01-14 DIAGNOSIS — Z23 Encounter for immunization: Secondary | ICD-10-CM | POA: Diagnosis not present

## 2020-01-14 DIAGNOSIS — L219 Seborrheic dermatitis, unspecified: Secondary | ICD-10-CM | POA: Diagnosis not present

## 2020-01-14 DIAGNOSIS — L304 Erythema intertrigo: Secondary | ICD-10-CM | POA: Diagnosis not present

## 2020-01-14 DIAGNOSIS — L821 Other seborrheic keratosis: Secondary | ICD-10-CM | POA: Diagnosis not present

## 2020-01-14 DIAGNOSIS — L918 Other hypertrophic disorders of the skin: Secondary | ICD-10-CM | POA: Diagnosis not present

## 2020-01-14 DIAGNOSIS — L658 Other specified nonscarring hair loss: Secondary | ICD-10-CM | POA: Diagnosis not present

## 2020-01-18 MED FILL — AMLODIPINE BESYLATE 2.5 MG: 2.5 | 30 days supply | Qty: 30 | Fill #2

## 2020-01-18 MED FILL — ?FENOFIBRATE 160 MG TABS: 160 | 30 days supply | Qty: 30 | Fill #2

## 2020-02-26 MED FILL — FENOFIBRATE 160 MG TABLET: 160 | 30 days supply | Qty: 30 | Fill #3

## 2020-02-26 MED FILL — AMLODIPINE 2.5 MG TABLET: 2.5 | 30 days supply | Qty: 30 | Fill #3

## 2020-03-29 MED FILL — AMLODIPINE 2.5 MG TABLET: 2.5 | 30 days supply | Qty: 30 | Fill #4

## 2020-03-29 MED FILL — FENOFIBRATE 160 MG TABLET: 160 | 30 days supply | Qty: 30 | Fill #4

## 2020-04-01 DIAGNOSIS — E1122 Type 2 diabetes mellitus with diabetic chronic kidney disease: Secondary | ICD-10-CM | POA: Diagnosis not present

## 2020-04-01 DIAGNOSIS — Z008 Encounter for other general examination: Secondary | ICD-10-CM | POA: Diagnosis not present

## 2020-04-01 DIAGNOSIS — Z794 Long term (current) use of insulin: Secondary | ICD-10-CM | POA: Diagnosis not present

## 2020-04-01 DIAGNOSIS — N1832 Chronic kidney disease, stage 3b: Secondary | ICD-10-CM | POA: Diagnosis not present

## 2020-04-01 DIAGNOSIS — F325 Major depressive disorder, single episode, in full remission: Secondary | ICD-10-CM | POA: Diagnosis not present

## 2020-04-01 DIAGNOSIS — Z6834 Body mass index (BMI) 34.0-34.9, adult: Secondary | ICD-10-CM | POA: Diagnosis not present

## 2020-04-01 DIAGNOSIS — E669 Obesity, unspecified: Secondary | ICD-10-CM | POA: Diagnosis not present

## 2020-04-27 ENCOUNTER — Ambulatory Visit: Payer: Medicare Other | Admitting: Pharmacist

## 2020-05-09 MED FILL — AMLODIPINE 2.5 MG TABLET: 2.5 | 30 days supply | Qty: 30 | Fill #5

## 2020-05-09 MED FILL — FENOFIBRATE 160 MG TABLET: 160 | 30 days supply | Qty: 30 | Fill #5

## 2020-05-27 ENCOUNTER — Other Ambulatory Visit: Payer: Self-pay | Admitting: Student

## 2020-05-27 DIAGNOSIS — E1151 Type 2 diabetes mellitus with diabetic peripheral angiopathy without gangrene: Secondary | ICD-10-CM | POA: Diagnosis not present

## 2020-05-27 DIAGNOSIS — Z79899 Other long term (current) drug therapy: Secondary | ICD-10-CM | POA: Diagnosis not present

## 2020-05-27 DIAGNOSIS — R3915 Urgency of urination: Secondary | ICD-10-CM | POA: Diagnosis not present

## 2020-05-27 DIAGNOSIS — Z7189 Other specified counseling: Secondary | ICD-10-CM | POA: Diagnosis not present

## 2020-05-27 DIAGNOSIS — Z794 Long term (current) use of insulin: Secondary | ICD-10-CM | POA: Diagnosis not present

## 2020-05-27 DIAGNOSIS — Z008 Encounter for other general examination: Secondary | ICD-10-CM | POA: Diagnosis not present

## 2020-05-27 DIAGNOSIS — E785 Hyperlipidemia, unspecified: Secondary | ICD-10-CM | POA: Diagnosis not present

## 2020-05-27 DIAGNOSIS — Z0001 Encounter for general adult medical examination with abnormal findings: Secondary | ICD-10-CM | POA: Diagnosis not present

## 2020-05-27 MED FILL — LANTUS SOLOSTAR 100 UNITS/M: 100 | 24 days supply | Qty: 12 | Fill #0

## 2020-05-27 MED FILL — TRULICITY 0.75 MG/0.5 ML PE: 0.75 | 28 days supply | Qty: 2 | Fill #0

## 2020-06-09 MED FILL — FENOFIBRATE 160 MG TABLET: 160 | 30 days supply | Qty: 30 | Fill #6

## 2020-06-10 MED FILL — ?AMLODIPINE BESYLATE 2.5MG.: 2.5 | 30 days supply | Qty: 30 | Fill #0

## 2020-06-28 DIAGNOSIS — E785 Hyperlipidemia, unspecified: Secondary | ICD-10-CM | POA: Diagnosis not present

## 2020-06-28 DIAGNOSIS — I129 Hypertensive chronic kidney disease with stage 1 through stage 4 chronic kidney disease, or unspecified chronic kidney disease: Secondary | ICD-10-CM | POA: Diagnosis not present

## 2020-06-28 DIAGNOSIS — Z794 Long term (current) use of insulin: Secondary | ICD-10-CM | POA: Diagnosis not present

## 2020-06-28 DIAGNOSIS — Z6833 Body mass index (BMI) 33.0-33.9, adult: Secondary | ICD-10-CM | POA: Diagnosis not present

## 2020-06-28 DIAGNOSIS — F330 Major depressive disorder, recurrent, mild: Secondary | ICD-10-CM | POA: Diagnosis not present

## 2020-06-28 DIAGNOSIS — E669 Obesity, unspecified: Secondary | ICD-10-CM | POA: Diagnosis not present

## 2020-06-28 DIAGNOSIS — E1151 Type 2 diabetes mellitus with diabetic peripheral angiopathy without gangrene: Secondary | ICD-10-CM | POA: Diagnosis not present

## 2020-06-28 DIAGNOSIS — N1832 Chronic kidney disease, stage 3b: Secondary | ICD-10-CM | POA: Diagnosis not present

## 2020-06-28 DIAGNOSIS — Z008 Encounter for other general examination: Secondary | ICD-10-CM | POA: Diagnosis not present

## 2020-07-19 MED FILL — FENOFIBRATE 160 MG TABLET: 160 | 30 days supply | Qty: 30 | Fill #7

## 2020-07-19 MED FILL — AMLODIPINE 2.5 MG TABLET: 2.5 | 30 days supply | Qty: 30 | Fill #1

## 2020-07-27 DIAGNOSIS — Z23 Encounter for immunization: Secondary | ICD-10-CM | POA: Diagnosis not present

## 2020-08-18 MED FILL — FENOFIBRATE 160 MG TABLET: 160 | 30 days supply | Qty: 30 | Fill #8

## 2020-08-18 MED FILL — AMLODIPINE 2.5 MG TABLET: 2.5 | 30 days supply | Qty: 30 | Fill #2

## 2020-08-24 DIAGNOSIS — E669 Obesity, unspecified: Secondary | ICD-10-CM | POA: Diagnosis not present

## 2020-08-24 DIAGNOSIS — Z0000 Encounter for general adult medical examination without abnormal findings: Secondary | ICD-10-CM | POA: Diagnosis not present

## 2020-08-24 DIAGNOSIS — Z794 Long term (current) use of insulin: Secondary | ICD-10-CM | POA: Diagnosis not present

## 2020-08-24 DIAGNOSIS — E1122 Type 2 diabetes mellitus with diabetic chronic kidney disease: Secondary | ICD-10-CM | POA: Diagnosis not present

## 2020-08-24 DIAGNOSIS — N183 Chronic kidney disease, stage 3 unspecified: Secondary | ICD-10-CM | POA: Diagnosis not present

## 2020-08-24 DIAGNOSIS — Z008 Encounter for other general examination: Secondary | ICD-10-CM | POA: Diagnosis not present

## 2020-09-22 ENCOUNTER — Other Ambulatory Visit: Payer: Self-pay | Admitting: Student

## 2020-09-22 DIAGNOSIS — Z13820 Encounter for screening for osteoporosis: Secondary | ICD-10-CM

## 2020-09-22 DIAGNOSIS — Z1231 Encounter for screening mammogram for malignant neoplasm of breast: Secondary | ICD-10-CM

## 2020-09-26 MED FILL — AMLODIPINE 2.5 MG TABLET: 2.5 | 30 days supply | Qty: 30 | Fill #3

## 2020-09-26 MED FILL — FENOFIBRATE 160 MG TABLET: 160 | 30 days supply | Qty: 30 | Fill #9

## 2020-10-14 ENCOUNTER — Telehealth: Payer: Self-pay | Admitting: General Practice

## 2020-10-14 NOTE — Telephone Encounter (Signed)
Copied from [place] [id number]. Topic: General - Other >> Oct 14, 2020  3:32 PM Leward Quan A wrote: Reason for CRM: Patient called in very angry stated that she does not want anyone to contact her from this office

## 2020-11-02 DIAGNOSIS — Z23 Encounter for immunization: Secondary | ICD-10-CM | POA: Diagnosis not present

## 2020-11-03 ENCOUNTER — Other Ambulatory Visit: Payer: Self-pay | Admitting: Student

## 2020-11-03 MED FILL — FENOFIBRATE 160 MG TABLET: 160 | 30 days supply | Qty: 30 | Fill #0

## 2020-11-03 MED FILL — AMLODIPINE 2.5 MG TABLET: 2.5 | 90 days supply | Qty: 90 | Fill #0

## 2020-12-05 MED FILL — FENOFIBRATE 160 MG TABLET: 160 | 90 days supply | Qty: 90 | Fill #1

## 2020-12-16 ENCOUNTER — Other Ambulatory Visit: Payer: Self-pay | Admitting: Family

## 2020-12-29 ENCOUNTER — Ambulatory Visit: Payer: Medicare Other

## 2020-12-29 ENCOUNTER — Other Ambulatory Visit: Payer: Medicare Other

## 2021-01-31 MED FILL — AMLODIPINE 2.5 MG TABLET: 2.5 | 90 days supply | Qty: 90 | Fill #1

## 2021-03-20 ENCOUNTER — Other Ambulatory Visit: Payer: Self-pay

## 2021-03-20 MED FILL — Fenofibrate Tab 160 MG: ORAL | 30 days supply | Qty: 30 | Fill #0 | Status: AC

## 2021-05-02 ENCOUNTER — Other Ambulatory Visit: Payer: Self-pay

## 2021-05-02 MED FILL — Fenofibrate Tab 160 MG: ORAL | 30 days supply | Qty: 30 | Fill #1 | Status: AC

## 2021-05-02 MED FILL — Amlodipine Besylate Tab 2.5 MG (Base Equivalent): ORAL | 30 days supply | Qty: 30 | Fill #0 | Status: AC

## 2021-05-03 ENCOUNTER — Other Ambulatory Visit: Payer: Self-pay

## 2021-06-12 ENCOUNTER — Other Ambulatory Visit: Payer: Self-pay

## 2021-06-12 MED FILL — Fenofibrate Tab 160 MG: ORAL | 30 days supply | Qty: 30 | Fill #2 | Status: AC

## 2021-06-12 MED FILL — Amlodipine Besylate Tab 2.5 MG (Base Equivalent): ORAL | 30 days supply | Qty: 30 | Fill #1 | Status: AC

## 2021-06-13 ENCOUNTER — Other Ambulatory Visit: Payer: Self-pay

## 2021-08-14 ENCOUNTER — Other Ambulatory Visit: Payer: Self-pay

## 2021-08-14 MED FILL — Fenofibrate Tab 160 MG: ORAL | 30 days supply | Qty: 30 | Fill #3 | Status: AC

## 2021-08-14 MED FILL — Amlodipine Besylate Tab 2.5 MG (Base Equivalent): ORAL | 30 days supply | Qty: 30 | Fill #2 | Status: AC

## 2021-08-15 ENCOUNTER — Other Ambulatory Visit: Payer: Self-pay

## 2021-10-03 ENCOUNTER — Other Ambulatory Visit: Payer: Self-pay

## 2021-10-03 MED ORDER — FENOFIBRATE 160 MG PO TABS
ORAL_TABLET | ORAL | 4 refills | Status: DC
  Filled 2021-10-03: qty 30, 30d supply, fill #0
  Filled 2021-11-28: qty 30, 30d supply, fill #1
  Filled 2021-11-28: qty 30, 30d supply, fill #0
  Filled 2022-01-16: qty 30, 30d supply, fill #1
  Filled 2022-03-06: qty 30, 30d supply, fill #2
  Filled 2022-04-19: qty 30, 30d supply, fill #3
  Filled 2022-06-11: qty 30, 30d supply, fill #4
  Filled 2022-07-20: qty 30, 30d supply, fill #5
  Filled 2022-08-20: qty 30, 30d supply, fill #6

## 2021-10-03 MED ORDER — AMLODIPINE BESYLATE 2.5 MG PO TABS
ORAL_TABLET | ORAL | 4 refills | Status: DC
  Filled 2021-10-03: qty 30, 30d supply, fill #0
  Filled 2021-11-28: qty 30, 30d supply, fill #1
  Filled 2021-11-28: qty 30, 30d supply, fill #0
  Filled 2022-01-16: qty 30, 30d supply, fill #1
  Filled 2022-03-06: qty 30, 30d supply, fill #2
  Filled 2022-04-19: qty 30, 30d supply, fill #3
  Filled 2022-06-11: qty 30, 30d supply, fill #4
  Filled 2022-07-20: qty 30, 30d supply, fill #5
  Filled 2022-08-20: qty 30, 30d supply, fill #6

## 2021-11-28 ENCOUNTER — Other Ambulatory Visit: Payer: Self-pay

## 2021-11-29 ENCOUNTER — Other Ambulatory Visit: Payer: Self-pay

## 2022-01-09 ENCOUNTER — Other Ambulatory Visit (HOSPITAL_COMMUNITY): Payer: Self-pay

## 2022-01-09 ENCOUNTER — Other Ambulatory Visit: Payer: Self-pay

## 2022-01-09 MED ORDER — PHENTERMINE HCL 37.5 MG PO TABS
37.5000 mg | ORAL_TABLET | Freq: Every day | ORAL | 0 refills | Status: AC
Start: 2022-01-09 — End: ?
  Filled 2022-01-09: qty 30, 30d supply, fill #0

## 2022-01-16 ENCOUNTER — Other Ambulatory Visit: Payer: Self-pay

## 2022-02-26 ENCOUNTER — Ambulatory Visit (HOSPITAL_COMMUNITY)
Admission: EM | Admit: 2022-02-26 | Discharge: 2022-02-26 | Disposition: A | Discharge status: Home / Self Care | Payer: Medicare Other | Attending: Psychiatry | Admitting: Psychiatry

## 2022-02-26 DIAGNOSIS — F4323 Adjustment disorder with mixed anxiety and depressed mood: Secondary | ICD-10-CM | POA: Diagnosis present

## 2022-02-26 NOTE — Discharge Instructions (Addendum)
Please contact one of the following facilities to start medication management and therapy services:  ? ?[facility] ?[address]  ?[place], [place] [address] ?[phone]  ? ?[facility]  ?[address] ?[place], [place] [address] ?[phone] ? ?[facility]  ?[address], [place], [place] [address] ?[phone] ? ?[facility]  ?[address]  ?[place], [place] [address] ?[phone] ? ?[facility]  ?[address] ?[place], [place] [address] ?[phone] ? ?[facility]  ?[address],  ?[place], [place] [address] ?[phone]  ?

## 2022-02-26 NOTE — ED Provider Notes (Signed)
Behavioral Health Urgent Care Medical Screening Exam ? ?Patient Name: Catherine Fowler ?MRN: [id number] ?Date of Evaluation: 02/26/22 ?Chief Complaint:   ?Diagnosis:  ?Final diagnoses:  ?Adjustment disorder with mixed anxiety and depressed mood  ? ? ?History of Present illness: Catherine Fowler is a 71 y.o. female patient presented to [facility] as a walk in alone.  ? ?Catherine Fowler, 71 y.o., female patient seen face to face by this provider, consulted with Dr. Serafina Mitchell; and chart reviewed on 02/26/22.  Chart review is limited.  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  She has been prescribed antidepressants but she cannot remember the name of the medications she can only recall gabapentin.  States all medications were ineffective because her situation has not changed.  She has also participated in therapy.  She currently does not have any outpatient psychiatric services in place. ? ?On evaluation Catherine Fowler reports 2-1/2 years ago her only daughter stopped talking to her.  She does not know why.  In addition she can no longer see her grandchildren.  States it is confusing because she does not understand why her daughter will no longer talk to her.  Reports this has caused an increase in her depression, anxiety, increased crying spells, feelings of guilt and helplessness.  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  She lives alone.She is alert/oriented x4 calm and cooperative.  She is well-groomed and makes good eye contact. She is tearful at times.  She is well spoken.  Her speech is clear, coherent, normal rate and tone.  She endorses a decrease in sleep, but sleeps roughly 4-6 hours per night.  She endorses a decrease in appetite and a 52 pound weight loss in the past 2 years.  She has intentionally lost some of the weight. States her decrease in appetite has factored in her weight loss.  She denies  SI/HI/AVH.  Objectively she does not to be responding to internal/external stimuli.  She denies paranoid and delusional thought content.  Patient remained calm and cooperative during the assessment and was able to answer questions appropriately. ? ?Discussed outpatient psychiatric resources.  Discussed PHP program with patient and she is willing to engage.  Referral was made to [facility] program.  Also provided patient with other psychiatric resources in the community for medication management and therapy. ? ?At this time Catherine Fowler is educated and verbalizes understanding of mental health resources and other crisis services in the community.  She is instructed to call 911 and present to the nearest emergency room should she experience any suicidal/homicidal ideation, auditory/visual/hallucinations, or detrimental worsening of her mental health condition.   ?  ? ?Psychiatric Specialty Exam ? ?Presentation  ?General Appearance:Appropriate for Environment ? ?Eye Contact:Good ? ?Speech:Clear and Coherent; Normal Rate ? ?Speech Volume:Normal ? ?Handedness:Right ? ? ?Mood and Affect  ?Mood:Anxious; Depressed; Hopeless ? ?Affect:Tearful; Congruent ? ? ?Thought Process  ?Thought Processes:Coherent ? ?Descriptions of Associations:Intact ? ?Orientation:Full (Time, Place and Person) ? ?Thought Content:Logical ?   Hallucinations:None ? ?Ideas of Reference:None ? ?Suicidal Thoughts:No ? ?Homicidal Thoughts:No ? ? ?Sensorium  ?Memory:Immediate Good; Recent Good; Remote Good ? ?Judgment:Good ? ?Insight:Good ? ? ?Executive Functions  ?Concentration:Good ? ?Attention Span:Good ? ?Recall:Good ? ?Fund of [place] recorded ?Language:Good ? ? ?Psychomotor Activity  ?Psychomotor Activity:Normal ? ? ?Assets  ?Assets:Communication Skills; Desire for Improvement; Financial Resources/Insurance; Physical Health; Leisure Time; Resilience;  Social Support; Transport planner; Housing ? ? ?Sleep  ?Sleep:Fair ? ?Number of hours:  6 ? ? ?No data recorded ? ?Physical Exam: ?Physical Exam ?Vitals and nursing note reviewed.  ?Constitutional:   ?   General: She is not in acute distress. ?   Appearance: Normal appearance. She is well-developed. She is not ill-appearing.  ?HENT:  ?   Head: Normocephalic and atraumatic.  ?Eyes:  ?   General:     ?   Right eye: No discharge.     ?   Left eye: No discharge.  ?   Conjunctiva/sclera: Conjunctivae normal.  ?Cardiovascular:  ?   Rate and Rhythm: Normal rate and regular rhythm.  ?   Heart sounds: No murmur heard. ?Pulmonary:  ?   Effort: Pulmonary effort is normal. No respiratory distress.  ?   Breath sounds: Normal breath sounds.  ?Abdominal:  ?   Palpations: Abdomen is soft.  ?   Tenderness: There is no abdominal tenderness.  ?Musculoskeletal:     ?   General: No swelling. Normal range of motion.  ?   Cervical back: Normal range of motion and neck supple.  ?Skin: ?   General: Skin is warm and dry.  ?   Capillary Refill: Capillary refill takes less than 2 seconds.  ?   Coloration: Skin is not jaundiced or pale.  ?Neurological:  ?   Mental Status: She is alert and oriented to person, place, and time.  ?Psychiatric:     ?   Attention and Perception: Attention and perception normal.     ?   Mood and Affect: Mood is anxious and depressed. Affect is tearful.     ?   Speech: Speech normal.     ?   Behavior: Behavior normal. Behavior is cooperative.     ?   Thought Content: Thought content normal.     ?   Cognition and Memory: Cognition normal.     ?   Judgment: Judgment normal.  ? ?Review of Systems  ?Constitutional: Negative.   ?HENT: Negative.    ?Eyes: Negative.   ?Respiratory: Negative.    ?Cardiovascular: Negative.   ?Musculoskeletal: Negative.   ?Skin: Negative.   ?Neurological: Negative.   ?Psychiatric/Behavioral:  Positive for depression. The patient is nervous/anxious.   ?Blood pressure 139/83, pulse 60, temperature (!) 97 ?F (36.1 ?C), temperature source Oral, resp. rate 18, SpO2 100 %. There is no  height or weight on file to calculate BMI. ? ?Musculoskeletal: ?Strength & Muscle Tone: within normal limits ?Gait & Station: normal ?Patient leans: N/A ? ? ?[facility] MSE Discharge Disposition for Follow up and Recommendations: ?Based on my evaluation the patient does not appear to have an emergency medical condition and can be discharged with resources and follow up care in outpatient services for Medication Management, Partial Hospitalization Program, and Individual Therapy ? ?Discharge patient. ? ?Bedded outpatient psychiatric resources including medication management, therapy, and PHP program referral made to [facility] program. ?No evidence of imminent risk to self or others at present.    ?Patient does not meet criteria for psychiatric inpatient admission. ?Discussed crisis plan, support from social network, calling 911, coming to the Emergency Department, and calling Suicide Hotline.  ? ?Revonda Humphrey, NP ?02/26/2022, 2:25 PM ? ?

## 2022-02-26 NOTE — Progress Notes (Signed)
?   02/26/22 1209  ?[place] Triage Screening (Walk-ins at [facility] only)  ?How Did You Hear About [place]? Self  ?What Is the Reason for Your Visit/Call Today? 71 year old Catherine Fowler present to [facility] alone reporting depressive symptoms triggered by her only daughter who stopped talking to her (ghosted) and will not allow her to see her grandchildren and the passing of her husband over 56 years ago (committed husband). Report this occurred 2 years, 6 months and 24 days ago. 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 Past mental treatment included medication management and cunseling but report nothing has changed.Patient does not know what she wants she just knows that she cannot continue to live this way anymore. Denied suicidal/homicidal ideations and homicidal/suicidal ideations.  ?How Long Has This Been Causing You Problems? > than 6 months  ?Have You Recently Had Any Thoughts About Hurting Yourself? No  ?Are You Planning to Commit Suicide/Harm Yourself At This time? No  ?Have you Recently Had Thoughts About [place]? No  ?Are You Planning To Harm Someone At This Time? No  ?Are you currently experiencing any auditory, visual or other hallucinations? No  ?Have You Used Any Alcohol or Drugs in the Past 24 Hours? No  ?Do you have any current medical co-morbidities that require immediate attention? No  ?Clinician description of patient physical appearance/behavior: Patient is tearful in triage, dressed appropriately for the weather and well spoken.  ?What Do You Feel Would Help You the Most Today? Medication(s);Stress Management  ?If access to [facility] Urgent [facility] was not available,  would you have sought care in the Emergency Department? No  ?Determination of Need Routine (7 days)  ?Options For Referral Medication Management;Outpatient Therapy  ? ? ?

## 2022-02-27 ENCOUNTER — Telehealth (HOSPITAL_COMMUNITY): Payer: Self-pay | Admitting: Licensed Clinical Social Worker

## 2022-03-06 ENCOUNTER — Other Ambulatory Visit: Payer: Self-pay

## 2022-03-07 ENCOUNTER — Other Ambulatory Visit: Payer: Self-pay

## 2022-04-19 ENCOUNTER — Other Ambulatory Visit: Payer: Self-pay

## 2022-04-20 ENCOUNTER — Other Ambulatory Visit: Payer: Self-pay

## 2022-06-11 ENCOUNTER — Other Ambulatory Visit: Payer: Self-pay

## 2022-06-21 ENCOUNTER — Other Ambulatory Visit: Payer: Self-pay

## 2022-06-22 ENCOUNTER — Other Ambulatory Visit: Payer: Self-pay

## 2022-07-20 ENCOUNTER — Other Ambulatory Visit: Payer: Self-pay

## 2022-07-23 ENCOUNTER — Other Ambulatory Visit: Payer: Self-pay

## 2022-08-20 ENCOUNTER — Other Ambulatory Visit: Payer: Self-pay

## 2022-08-21 ENCOUNTER — Other Ambulatory Visit: Payer: Self-pay

## 2022-10-07 ENCOUNTER — Emergency Department (HOSPITAL_COMMUNITY)
Admission: EM | Admit: 2022-10-07 | Discharge: 2022-10-07 | Disposition: A | Discharge status: Home / Self Care | Payer: Medicare Other | Attending: Emergency Medicine | Admitting: Emergency Medicine

## 2022-10-07 ENCOUNTER — Other Ambulatory Visit: Payer: Self-pay

## 2022-10-07 DIAGNOSIS — Z794 Long term (current) use of insulin: Secondary | ICD-10-CM | POA: Diagnosis not present

## 2022-10-07 DIAGNOSIS — R21 Rash and other nonspecific skin eruption: Secondary | ICD-10-CM | POA: Diagnosis present

## 2022-10-07 DIAGNOSIS — B029 Zoster without complications: Secondary | ICD-10-CM | POA: Insufficient documentation

## 2022-10-07 MED ORDER — HYDROCODONE-ACETAMINOPHEN 5-325 MG PO TABS
1.0000 | ORAL_TABLET | Freq: Once | ORAL | Status: DC
  Filled 2022-10-07: qty 1

## 2022-10-07 MED ORDER — VALACYCLOVIR HCL 1 G PO TABS: 1000.0000 mg | ORAL_TABLET | Freq: Three times a day (TID) | ORAL | 0 refills | Status: DC

## 2022-10-07 MED ORDER — VALACYCLOVIR HCL 1 G PO TABS
1000.0000 mg | ORAL_TABLET | Freq: Three times a day (TID) | ORAL | 0 refills | Status: AC
  Filled 2022-10-07: qty 21, 7d supply, fill #0

## 2022-10-07 MED ORDER — VALACYCLOVIR HCL 500 MG PO TABS
1000.0000 mg | ORAL_TABLET | Freq: Once | ORAL | Status: AC
  Administered 2022-10-07: 1000 mg via ORAL
  Filled 2022-10-07: qty 2

## 2022-10-07 NOTE — ED Provider Notes (Signed)
[place] DEPT Provider Note   CSN: [id number] Arrival date & time: 10/07/22  1714     History  Chief Complaint  Patient presents with   Rash    Catherine Fowler is a 71 y.o. female.  72 year old female presents with concern for a rash to her right buttocks and posterior right thigh which for started yesterday.  Area is painful to the touch.  No known exposures, no new soaps or lotions.  States that she did sleep in a motel and is unsure if these are bedbug bites.  No other complaints or concerns.       Home Medications Prior to Admission medications   Medication Sig Start Date End Date Taking? Authorizing Provider  valACYclovir (VALTREX) 1000 MG tablet Take 1 tablet (1,000 mg total) by mouth 3 (three) times daily for 7 days. 10/07/22 10/14/22 Yes Tacy Learn, PA-C  amLODipine (NORVASC) 2.5 MG tablet Take 1 tablet (2.5 mg total) by mouth daily. For blood pressure 10/28/19   Fulp, Cammie, MD  amLODipine (NORVASC) 2.5 MG tablet TAKE 1 TABLET BY MOUTH DAILY 12/16/20 12/16/21  Novella Rob T, FNP  amLODipine (NORVASC) 2.5 MG tablet TAKE 1 TABLET BY MOUTH ONCE DAILY 11/03/20 11/03/21  Cipriano Mile, NP  amLODipine (NORVASC) 2.5 MG tablet Take 1 tablet by mouth once a day for BP 10/02/21     Dulaglutide (TRULICITY) 1.5 YT/0.3TW SOPN Inject 1.5 mg into the skin once a week. 10/30/19   Fulp, Cammie, MD  fenofibrate (TRICOR) 145 MG tablet Take 1 tablet (145 mg total) by mouth daily. 10/28/19   Fulp, Cammie, MD  fenofibrate 160 MG tablet TAKE 1 TABLET BY MOUTH DAILY 12/16/20 12/16/21  Novella Rob T, FNP  fenofibrate 160 MG tablet TAKE 1 TABLET BY MOUTH ONCE DAILY 05/27/20 05/27/21  Cipriano Mile, NP  fenofibrate 160 MG tablet Take 1 tablet by mouth once a day 10/02/21     glucose blood (WAVESENSE PRESTO) test strip Use as instructed 10/28/19   Fulp, Cammie, MD  glucose monitoring kit (FREESTYLE) monitoring kit 1 each by Does not apply route 4 (four) times daily -  after meals and at bedtime. 1 month Diabetic Testing Supplies for QAC-QHS accuchecks. Any brand okay 08/14/13   Thurnell Lose, MD  HYDROcodone-acetaminophen (NORCO/VICODIN) 5-325 MG tablet Take 1 tablet by mouth every 4 (four) hours as needed for moderate pain. Patient not taking: Reported on 10/28/2019 02/18/19   Jola Schmidt, MD  Insulin Glargine (LANTUS SOLOSTAR) 100 UNIT/ML Solostar Pen INJECT 50 UNITS INTO THE SKIN 2 TIMES A DAY. 10/28/19   Fulp, Cammie, MD  liraglutide (VICTOZA) 18 MG/3ML SOPN Inject 0.3 mLs (1.8 mg total) into the skin daily. 10/28/19   Fulp, Cammie, MD  phentermine (ADIPEX-P) 37.5 MG tablet Take 1 tablet (37.5 mg total) by mouth daily. 01/09/22         Allergies    Pravastatin sodium and Simvastatin    Review of Systems   Review of Systems Negative except as per HPI Physical Exam Updated Vital Signs BP 130/74 (BP Location: Right Arm)   Pulse 85   Temp 98.4 F (36.9 C) (Oral)   Resp 15   SpO2 100%  Physical Exam Vitals and nursing note reviewed.  Constitutional:      General: She is not in acute distress.    Appearance: She is well-developed. She is not diaphoretic.  HENT:     Head: Normocephalic and atraumatic.  Pulmonary:     Effort: Pulmonary  effort is normal.  Skin:    General: Skin is warm and dry.     Findings: Rash present.     Comments: Clusters of vesicles starting at the right buttock and running to the mid posterior thigh, appear to be running along the dermatome, consistent with shingles.  Neurological:     Mental Status: She is alert and oriented to person, place, and time.  Psychiatric:        Behavior: Behavior normal.     ED Results / Procedures / Treatments   Labs (all labs ordered are listed, but only abnormal results are displayed) Labs Reviewed - No data to display  EKG None  Radiology No results found.  Procedures Procedures    Medications Ordered in ED Medications  valACYclovir (VALTREX) tablet 1,000 mg (has no  administration in time range)    ED Course/ Medical Decision Making/ A&P                           Medical Decision Making Risk Prescription drug management.   71 year old female with rash to right buttock and posterior right thigh onset yesterday.  Area is painful.  Does have groups of vesicles in a dermatomal distribution.  Likely shingles.  Started on Valtrex with first dose given in the ER today and prescription for same sent to patient's pharmacy to pick up tomorrow.  Recommend Motrin and Tylenol as needed directed.  Can take melatonin at night to help with sleep and recheck with primary care provider for further management.        Final Clinical Impression(s) / ED Diagnoses Final diagnoses:  Herpes zoster without complication    Rx / DC Orders ED Discharge Orders          Ordered    valACYclovir (VALTREX) 1000 MG tablet  3 times daily        10/07/22 1751              Tacy Learn, PA-C 10/07/22 1755    Lacretia Leigh, MD 10/14/22 [phone]

## 2022-10-07 NOTE — ED Triage Notes (Signed)
Pt reports rash to the right buttock and right posterior leg x 2 days.

## 2022-10-07 NOTE — Discharge Instructions (Signed)
Take valtrex as prescribed and complete the full course. Motrin and Tylenol as needed as directed. Melatonin at night as needed to help with sleep.  Recheck with your doctor.

## 2022-10-08 ENCOUNTER — Other Ambulatory Visit: Payer: Self-pay

## 2022-10-08 ENCOUNTER — Telehealth (HOSPITAL_COMMUNITY): Payer: Self-pay | Admitting: Physician Assistant

## 2022-10-08 ENCOUNTER — Other Ambulatory Visit (HOSPITAL_COMMUNITY): Payer: Self-pay

## 2022-10-08 MED ORDER — VALACYCLOVIR HCL 1 G PO TABS
1000.0000 mg | ORAL_TABLET | Freq: Three times a day (TID) | ORAL | 0 refills | Status: DC
  Filled 2022-10-08: qty 21, 7d supply, fill #0

## 2022-10-08 NOTE — Telephone Encounter (Signed)
Patient called, reportedly did not receive her Valtrex, no record at [place] of prescription.  Apparently originally sent to the health department.  Prescription refilled and recent.

## 2022-10-09 ENCOUNTER — Other Ambulatory Visit: Payer: Self-pay

## 2022-10-15 ENCOUNTER — Other Ambulatory Visit: Payer: Self-pay

## 2022-10-16 ENCOUNTER — Other Ambulatory Visit: Payer: Self-pay

## 2022-10-16 MED ORDER — AMLODIPINE BESYLATE 2.5 MG PO TABS
ORAL_TABLET | ORAL | 4 refills | Status: AC
  Filled 2022-10-16: qty 30, 30d supply, fill #0
  Filled 2022-12-13: qty 30, 30d supply, fill #1

## 2022-10-16 MED ORDER — FENOFIBRATE 160 MG PO TABS
ORAL_TABLET | ORAL | 4 refills | Status: AC
  Filled 2022-10-16: qty 30, 30d supply, fill #0
  Filled 2022-12-13: qty 30, 30d supply, fill #1

## 2022-10-17 ENCOUNTER — Other Ambulatory Visit: Payer: Self-pay

## 2022-12-13 ENCOUNTER — Other Ambulatory Visit: Payer: Self-pay

## 2022-12-17 ENCOUNTER — Other Ambulatory Visit: Payer: Self-pay

## 2023-01-28 ENCOUNTER — Other Ambulatory Visit (HOSPITAL_COMMUNITY): Payer: Self-pay

## 2023-04-16 ENCOUNTER — Other Ambulatory Visit (HOSPITAL_COMMUNITY): Payer: Self-pay

## 2023-04-16 MED ORDER — TRUE VITAMIN D3 1.25 MG (50000 UT) PO CAPS
50000.0000 [IU] | ORAL_CAPSULE | ORAL | 0 refills | Status: AC
  Filled 2023-04-17: qty 4, 28d supply, fill #0
  Filled 2023-04-18: qty 12, 84d supply, fill #0

## 2023-04-17 ENCOUNTER — Other Ambulatory Visit (HOSPITAL_COMMUNITY): Payer: Self-pay

## 2023-04-18 ENCOUNTER — Other Ambulatory Visit (HOSPITAL_COMMUNITY): Payer: Self-pay

## 2023-04-18 ENCOUNTER — Other Ambulatory Visit: Payer: Self-pay

## 2023-04-19 ENCOUNTER — Other Ambulatory Visit (HOSPITAL_COMMUNITY): Payer: Self-pay

## 2023-04-19 ENCOUNTER — Other Ambulatory Visit: Payer: Self-pay

## 2023-06-11 ENCOUNTER — Other Ambulatory Visit: Payer: Self-pay

## 2023-06-11 MED ORDER — VITAMIN D3 50 MCG (2000 UT) PO CAPS
2000.0000 [IU] | ORAL_CAPSULE | Freq: Every day | ORAL | 3 refills | Status: AC
  Filled 2023-06-11: qty 100, 100d supply, fill #0
  Filled 2024-04-27: qty 100, 100d supply, fill #1

## 2023-06-12 ENCOUNTER — Other Ambulatory Visit: Payer: Self-pay

## 2023-07-19 ENCOUNTER — Other Ambulatory Visit: Payer: Self-pay

## 2023-07-19 MED ORDER — BUPROPION HCL ER (XL) 300 MG PO TB24
300.0000 mg | ORAL_TABLET | Freq: Every day | ORAL | 4 refills | Status: DC
  Filled 2023-07-19: qty 90, 90d supply, fill #0

## 2023-07-22 ENCOUNTER — Other Ambulatory Visit: Payer: Self-pay

## 2023-07-29 ENCOUNTER — Other Ambulatory Visit: Payer: Self-pay

## 2023-07-29 MED ORDER — OZEMPIC (2 MG/DOSE) 8 MG/3ML ~~LOC~~ SOPN: 2.0000 mg | PEN_INJECTOR | SUBCUTANEOUS | 0 refills | Status: DC

## 2023-10-22 ENCOUNTER — Other Ambulatory Visit: Payer: Self-pay

## 2023-10-22 MED ORDER — BUPROPION HCL ER (SR) 150 MG PO TB12
150.0000 mg | ORAL_TABLET | Freq: Two times a day (BID) | ORAL | 4 refills | Status: DC
  Filled 2023-10-22: qty 180, 90d supply, fill #0

## 2024-04-27 ENCOUNTER — Other Ambulatory Visit: Payer: Self-pay

## 2024-05-01 ENCOUNTER — Other Ambulatory Visit: Payer: Self-pay

## 2024-07-31 ENCOUNTER — Other Ambulatory Visit: Payer: Self-pay

## 2024-07-31 MED ORDER — VITAMIN D3 50 MCG (2000 UT) PO CAPS
2000.0000 [IU] | ORAL_CAPSULE | Freq: Every day | ORAL | 1 refills | Status: AC
  Filled 2024-07-31: qty 100, 100d supply, fill #0

## 2024-08-06 ENCOUNTER — Other Ambulatory Visit: Payer: Self-pay

## 2024-08-18 ENCOUNTER — Other Ambulatory Visit: Payer: Self-pay
